# Patient Record
Sex: Male | Born: 1955 | Race: Black or African American | Hispanic: No | Marital: Single | Smoking: Current every day smoker
Health system: Southern US, Community
[De-identification: ages and names within clinical notes are randomized; demographics above are authoritative.]

## PROBLEM LIST (undated history)

## (undated) DIAGNOSIS — R7303 Prediabetes: Secondary | ICD-10-CM

## (undated) DIAGNOSIS — R0989 Other specified symptoms and signs involving the circulatory and respiratory systems: Secondary | ICD-10-CM

## (undated) DIAGNOSIS — M1990 Unspecified osteoarthritis, unspecified site: Secondary | ICD-10-CM

## (undated) DIAGNOSIS — E291 Testicular hypofunction: Secondary | ICD-10-CM

## (undated) HISTORY — DX: Other specified symptoms and signs involving the circulatory and respiratory systems: R09.89

## (undated) HISTORY — DX: Prediabetes: R73.03

## (undated) HISTORY — PX: HERNIA REPAIR: SHX51

## (undated) HISTORY — DX: Testicular hypofunction: E29.1

---

## 2004-02-03 ENCOUNTER — Ambulatory Visit (HOSPITAL_COMMUNITY): Admission: RE | Admit: 2004-02-03 | Discharge: 2004-02-03 | Payer: Self-pay | Admitting: General Surgery

## 2004-02-03 ENCOUNTER — Ambulatory Visit (HOSPITAL_BASED_OUTPATIENT_CLINIC_OR_DEPARTMENT_OTHER): Admission: RE | Admit: 2004-02-03 | Discharge: 2004-02-03 | Payer: Self-pay | Admitting: General Surgery

## 2004-07-19 ENCOUNTER — Emergency Department (HOSPITAL_COMMUNITY): Admission: EM | Admit: 2004-07-19 | Discharge: 2004-07-19 | Payer: Self-pay | Admitting: Emergency Medicine

## 2007-09-27 ENCOUNTER — Emergency Department (HOSPITAL_COMMUNITY): Admission: EM | Admit: 2007-09-27 | Discharge: 2007-09-27 | Payer: Self-pay | Admitting: Emergency Medicine

## 2009-02-13 ENCOUNTER — Emergency Department (HOSPITAL_COMMUNITY): Admission: EM | Admit: 2009-02-13 | Discharge: 2009-02-13 | Payer: Self-pay | Admitting: Emergency Medicine

## 2009-02-27 ENCOUNTER — Encounter: Payer: Self-pay | Admitting: Gastroenterology

## 2009-03-06 ENCOUNTER — Encounter: Payer: Self-pay | Admitting: Gastroenterology

## 2009-03-06 ENCOUNTER — Ambulatory Visit (HOSPITAL_COMMUNITY): Admission: RE | Admit: 2009-03-06 | Discharge: 2009-03-06 | Payer: Self-pay | Admitting: Internal Medicine

## 2009-03-06 ENCOUNTER — Encounter: Payer: Self-pay | Admitting: Internal Medicine

## 2009-03-13 ENCOUNTER — Encounter: Admission: RE | Admit: 2009-03-13 | Discharge: 2009-03-13 | Payer: Self-pay | Admitting: Internal Medicine

## 2009-03-13 ENCOUNTER — Encounter: Payer: Self-pay | Admitting: Internal Medicine

## 2009-04-03 ENCOUNTER — Ambulatory Visit: Payer: Self-pay | Admitting: Internal Medicine

## 2009-04-10 ENCOUNTER — Ambulatory Visit: Payer: Self-pay | Admitting: Gastroenterology

## 2009-04-10 DIAGNOSIS — D638 Anemia in other chronic diseases classified elsewhere: Secondary | ICD-10-CM | POA: Insufficient documentation

## 2009-04-10 DIAGNOSIS — D573 Sickle-cell trait: Secondary | ICD-10-CM | POA: Insufficient documentation

## 2009-07-10 ENCOUNTER — Ambulatory Visit: Payer: Self-pay | Admitting: Internal Medicine

## 2009-07-10 DIAGNOSIS — F17200 Nicotine dependence, unspecified, uncomplicated: Secondary | ICD-10-CM | POA: Insufficient documentation

## 2009-07-23 ENCOUNTER — Emergency Department (HOSPITAL_COMMUNITY): Admission: EM | Admit: 2009-07-23 | Discharge: 2009-07-23 | Payer: Self-pay | Admitting: Emergency Medicine

## 2010-04-30 ENCOUNTER — Encounter: Payer: Self-pay | Admitting: Gastroenterology

## 2010-05-01 ENCOUNTER — Encounter: Payer: Self-pay | Admitting: Gastroenterology

## 2010-05-01 ENCOUNTER — Ambulatory Visit (HOSPITAL_COMMUNITY): Admission: RE | Admit: 2010-05-01 | Discharge: 2010-05-01 | Payer: Self-pay | Admitting: Internal Medicine

## 2010-05-07 ENCOUNTER — Encounter (INDEPENDENT_AMBULATORY_CARE_PROVIDER_SITE_OTHER): Payer: Self-pay | Admitting: *Deleted

## 2010-05-31 ENCOUNTER — Encounter (INDEPENDENT_AMBULATORY_CARE_PROVIDER_SITE_OTHER): Payer: Self-pay | Admitting: *Deleted

## 2010-06-04 ENCOUNTER — Ambulatory Visit: Payer: Self-pay | Admitting: Gastroenterology

## 2010-06-18 ENCOUNTER — Ambulatory Visit: Payer: Self-pay | Admitting: Gastroenterology

## 2010-06-18 HISTORY — PX: COLONOSCOPY: SHX174

## 2010-06-20 ENCOUNTER — Encounter: Payer: Self-pay | Admitting: Gastroenterology

## 2010-12-20 NOTE — Letter (Signed)
Summary: [facility]  [facility]   Imported By: Sherian Rein 06/21/2010 09:03:55  _____________________________________________________________________  External Attachment:    Type:   Image     Comment:   External Document

## 2010-12-20 NOTE — Letter (Signed)
Summary: Patient Notice- Polyp Results  [facility]  [address] [place], [place] [address]   Phone: [phone]  Fax: [phone]        June 20, 2010 MRN: [id number]    Tony Ware [address] [place], [place]  [address]    Dear [name],  I am pleased to inform you that the colon polyp(s) removed during your recent colonoscopy was (were) found to be benign (no cancer detected) upon pathologic examination.  I recommend you have a repeat colonoscopy examination in 5_ years to look for recurrent polyps, as having colon polyps increases your risk for having recurrent polyps or even colon cancer in the future.  Should you develop new or worsening symptoms of abdominal pain, bowel habit changes or bleeding from the rectum or bowels, please schedule an evaluation with either your primary care physician or with me.  Additional information/recommendations:  __ No further action with gastroenterology is needed at this time. Please      follow-up with your primary care physician for your other healthcare      needs.  __ Please call [phone] to schedule a return visit to review your      situation.  __ Please keep your follow-up visit as already scheduled.  _x_ Continue treatment plan as outlined the [place] of your exam.  Please call us if you are having persistent problems or have questions about your condition that have not been fully answered at this time.  Sincerely,  Louis Meckel MD  This letter has been electronically signed by your physician.  Appended Document: Patient Notice- Polyp Results Letter mailed 8.3.2011

## 2010-12-20 NOTE — Letter (Signed)
Summary: [facility] Instructions  [facility]  [address] [place], [place] [address]   Phone: [phone]  Fax: [phone]       Tony Ware    2056-06-19    MRN: [id number]        Procedure [place] /Date:  06/18/10   Monday     Arrival Time:  9:30am      Procedure Time:  10:30am     Location of Procedure:                    _x _  [facility] (4th Floor)   PREPARATION FOR COLONOSCOPY WITH MOVIPREP   Starting 5 days prior to your procedure _ 7/27/11_ do not eat nuts, seeds, popcorn, corn, beans, peas,  salads, or any raw vegetables.  Do not take any fiber supplements (e.g. Metamucil, Citrucel, and Benefiber).  THE [place] BEFORE YOUR PROCEDURE         DATE:   06/17/10  [place]:  Sunday  1.  Drink clear liquids the entire [place]-NO SOLID FOOD  2.  Do not drink anything colored red or purple.  Avoid juices with pulp.  No orange juice.  3.  Drink at least 64 oz. (8 glasses) of fluid/clear liquids during the [place] to prevent dehydration and help the prep work efficiently.  CLEAR LIQUIDS INCLUDE: Water Jello Ice Popsicles Tea (sugar ok, no milk/cream) Powdered fruit flavored drinks Coffee (sugar ok, no milk/cream) Gatorade Juice: apple, white grape, white cranberry  Lemonade Clear bullion, consomm, broth Carbonated beverages (any kind) Strained chicken noodle soup Hard Candy                             4.  In the morning, mix first dose of MoviPrep solution:    Empty 1 Pouch A and 1 Pouch B into the disposable container    Add lukewarm drinking water to the top line of the container. Mix to dissolve    Refrigerate (mixed solution should be used within 24 hrs)  5.  Begin drinking the prep at 5:00 p.m. The MoviPrep container is divided by 4 marks.   Every 15 minutes drink the solution down to the next mark (approximately 8 oz) until the full liter is complete.   6.  Follow completed prep with 16 oz of clear liquid of your choice (Nothing red or purple).   Continue to drink clear liquids until bedtime.  7.  Before going to bed, mix second dose of MoviPrep solution:    Empty 1 Pouch A and 1 Pouch B into the disposable container    Add lukewarm drinking water to the top line of the container. Mix to dissolve    Refrigerate  THE [place] OF YOUR PROCEDURE      DATE:  06/18/10  [place]:  Monday  Beginning at 5:30 a.m. (5 hours before procedure):         1. Every 15 minutes, drink the solution down to the next mark (approx 8 oz) until the full liter is complete.  2. Follow completed prep with 16 oz. of clear liquid of your choice.    3. You may drink clear liquids until _  _ (2 HOURS BEFORE PROCEDURE).   MEDICATION INSTRUCTIONS  Unless otherwise instructed, you should take regular prescription medications with a small sip of water   as early as possible the morning of your procedure.  OTHER INSTRUCTIONS  You will need a responsible adult at least 55 years of age to accompany you and drive you home.   This person must remain in the waiting room during your procedure.  Wear loose fitting clothing that is easily removed.  Leave jewelry and other valuables at home.  However, you may wish to bring a book to read or  an iPod/MP3 player to listen to music as you wait for your procedure to start.  Remove all body piercing jewelry and leave at home.  Total time from sign-in until discharge is approximately 2-3 hours.  You should go home directly after your procedure and rest.  You can resume normal activities the  [place] after your procedure.  The [place] of your procedure you should not:   Drive   Make legal decisions   Operate machinery   Drink alcohol   Return to work  You will receive specific instructions about eating, activities and medications before you leave.    The above instructions have been reviewed and explained to me by   Karl Bales RN  June 04, 2010 1:04 PM    I fully understand and can verbalize  these instructions _____________________________ Date _________

## 2010-12-20 NOTE — Procedures (Signed)
Summary: Colonoscopy  Patient: Tony Ware Note: All result statuses are Final unless otherwise noted.  Tests: (1) Colonoscopy (COL)   COL Colonoscopy           DONE      [facility]     [address]     [place], [place]  [address]           COLONOSCOPY PROCEDURE REPORT           PATIENT:  [name], [name]  MR#:  [id number]     BIRTHDATE:  1956-07-23, 53 yrs. old  GENDER:  male           ENDOSCOPIST:  Barbette Hair. Arlyce Dice, MD     Referred by:           PROCEDURE DATE:  06/18/2010     PROCEDURE:  Colonoscopy with snare polypectomy     ASA CLASS:  Class II     INDICATIONS:  1) Routine Risk Screening  2) Anemia Heme negative     stool; equivical Fe studies           MEDICATIONS:   Fentanyl 75 mcg IV, Versed 8 mg IV           DESCRIPTION OF PROCEDURE:   After the risks benefits and     alternatives of the procedure were thoroughly explained, informed     consent was obtained.  Digital rectal exam was performed and     revealed no abnormalities.   The LB CF-[id number] endoscope     was introduced through the anus and advanced to the cecum, which     was identified by both the appendix and ileocecal valve, without     limitations.  The quality of the prep was excellent, using     MoviPrep.  The instrument was then slowly withdrawn as the colon     was fully examined.     <<PROCEDUREIMAGES>>           FINDINGS:  There were multiple polyps identified and removed. in     the transverse colon. 3 sessile polyps proximal and midtransverse     colon measuring 3,5 and 3mm, respectively. Largest polyp was     removed with hot polypectomy snare; other polyps were removed with     cold polypectomy snare. Specimens were submitted to pathology.     Polyps were not bleeding (see image5 and image6).  This was     otherwise a normal examination of the colon (see image1, image2,     image4, image8, image10, image11, and image12).   Retroflexed     views in the rectum revealed no abnormalities.     The time to     cecum =  2.0  minutes. The scope was then withdrawn (time =  8.25     min) from the patient and the procedure completed.           COMPLICATIONS:  None           ENDOSCOPIC IMPRESSION:     1) Nonbleeding Polyps, multiple in the transverse colon     2) Otherwise normal examination     RECOMMENDATIONS:     1) If the polyp(s) removed today are proven to be adenomatous     (pre-cancerous) polyps, you will need a repeat colonoscopy in 5     years. Otherwise you should continue to follow colorectal cancer     screening guidelines for "routine risk" patients with colonoscopy  in 10 years.     2) hemeoccults in 1 week.           REPEAT EXAM:   You will receive a letter from Dr. Arlyce Dice in 1-2     weeks, after reviewing the final pathology, with followup     recommendations.           ______________________________     Barbette Hair Arlyce Dice, MD           CC: Lucky Cowboy, MD           n.     Rosalie Doctor:   Barbette Hair. Janmichael Giraud at 06/18/2010 11:18 AM           Schauf, Gabriel Rung [phone]  Note: An exclamation mark (!) indicates a result that was not dispersed into the flowsheet. Document Creation Date: 06/18/2010 11:18 AM _______________________________________________________________________  (1) Order result status: Final Collection or observation date-time: 06/18/2010 11:06 Requested date-time:  Receipt date-time:  Reported date-time:  Referring Physician:   Ordering Physician: Melvia Heaps [phone]) Specimen Source:  Source: Launa Grill Order Number: [id number] Lab site:   Appended Document: Colonoscopy     Procedures Next Due Date:    Colonoscopy: 06/2015

## 2010-12-20 NOTE — Letter (Signed)
Summary: Previsit letter  [facility]  [address] [place], [place] [address]   Phone: [phone]  Fax: [phone]       05/07/2010 MRN: [id number]  [facility] [address] [place], [place]  [address]  Dear Mr. SZABO,  Welcome to the Gastroenterology Division at [facility].    You are scheduled to see a nurse for your pre-procedure visit on 06-04-10 at 1pm on the 3rd floor at [facility], [address]  We ask that you try to arrive at our office 15 minutes prior to your appointment time to allow for check-in.  Your nurse visit will consist of discussing your medical and surgical history, your immediate family medical history, and your medications.    Please bring a complete list of all your medications or, if you prefer, bring the medication bottles and we will list them.  We will need to be aware of both prescribed and over the counter drugs.  We will need to know exact dosage information as well.  If you are on blood thinners (Coumadin, Plavix, Aggrenox, Ticlid, etc.) please call our office today/prior to your appointment, as we need to consult with your physician about holding your medication.   Please be prepared to read and sign documents such as consent forms, a financial agreement, and acknowledgement forms.  If necessary, and with your consent, a friend or relative is welcome to sit-in on the nurse visit with you.  Please bring your insurance card so that we may make a copy of it.  If your insurance requires a referral to see a specialist, please bring your referral form from your primary care physician.  No co-pay is required for this nurse visit.     If you cannot keep your appointment, please call [phone] to cancel or reschedule prior to your appointment date.  This allows [place] the opportunity to schedule an appointment for another patient in need of care.    Thank you for choosing [facility] for your medical needs.  We appreciate  the opportunity to care for you.  Please visit [place] at our website  to learn more about our practice.                     Sincerely.                                                                                                                   The Gastroenterology Division

## 2010-12-20 NOTE — Miscellaneous (Signed)
Summary: [facility] previsit  Clinical Lists Changes  Medications: Added new medication of MOVIPREP 100 GM  SOLR (PEG-KCL-NACL-NASULF-NA ASC-C) As per prep instructions. - Signed Rx of MOVIPREP 100 GM  SOLR (PEG-KCL-NACL-NASULF-NA ASC-C) As per prep instructions.;  #1 x 0;  Signed;  Entered by: Karl Bales RN;  Authorized by: Louis Meckel MD;  Method used: Electronically to [facility] [phone]*, [address] [place], [place], [place]  [address], Ph: [phone] or [phone], Fax: [phone] Observations: Added new observation of NKA: T (06/04/2010 12:43)    Prescriptions: MOVIPREP 100 GM  SOLR (PEG-KCL-NACL-NASULF-NA ASC-C) As per prep instructions.  #1 x 0   Entered by:   Karl Bales RN   Authorized by:   Louis Meckel MD   Signed by:   Karl Bales RN on 06/04/2010   Method used:   Electronically to        [facility] [phone]* (retail)       [address] [place], [place]  [address]       Ph: [phone] or [phone]       Fax: [phone]   RxID:   [id number]

## 2011-04-05 NOTE — Op Note (Signed)
NAME:  Tony Ware, Tony Ware                               ACCOUNT NO.:  [id number]   MEDICAL RECORD NO.:  [id number]                   PATIENT TYPE:  AMB   LOCATION:  DSC                                  FACILITY:  [facility]   PHYSICIAN:  Jimmye Norman III, M.D.               DATE OF BIRTH:  October 30, 1956   DATE OF PROCEDURE:  02/03/2004  DATE OF DISCHARGE:                                 OPERATIVE REPORT   PREOPERATIVE DIAGNOSIS:  Right inguinal hernia.   POSTOPERATIVE DIAGNOSIS:  Indirect right inguinal hernia.   PROCEDURE:  Right inguinal hernia repair.   SURGEON:  Jimmye Norman, M.D.   ASSISTANT:  None.   ANESTHESIA:  General with a laryngeal airway.   ESTIMATED BLOOD LOSS:  Less than 10 mL.   COMPLICATIONS:  None.   DISPOSITION:  Stable.   INDICATIONS FOR PROCEDURE:  The patient is a 55 year old with a right  inguinal hernia who comes in for repair.   FINDINGS:  The patient had an indirect sac with a small weakness in the  floor.  Anatomy was otherwise normal.   DESCRIPTION OF PROCEDURE:  The patient was taken to the operating room and  placed on the table in the supine position.  After an adequate laryngeal  airway anesthetic was administered, he was prepped and draped in the usual  sterile fashion exposing the right lower quadrant.   Curvilinear incision was made at the inferior skin fold at the left groin  area at the level of the superficial ring.  It was taken down to and through  Scarpa's fascia using electrocautery.  We dissected down to the external  oblique fascia which was then split along its fibers down through the  superficial ring.   We mobilized the spermatic cord and used the Penrose drain to control it as  we dissected out the floor.  There was some weakness in the floor, but no  overt direct hernia.   We placed the spermatic cord up on a work bench and dissected out a hernia  sac which was anteromedially.  There was also some lipomatous accumulation  in the cord,  but no overt lipoma.   The hernia sac itself was dissected free and was subsequently suture ligated  at its base using a low Ethibond suture.  Once this was done, it retracted  back into the subfascial plane after transecting it.  We then placed an oval  piece of mesh measuring 4 x 2 cm in size starting at the pubic tubercle and  out to the deep ring.  We attached it to the conjoin tendon and a reflected  portion of the inguinal ligament using a running 0 Prolene suture.  Once  this was sutured in place, we placed the cord back into the canal and then  oversewed that area with the external oblique fascia using a running 3-0  Vicryl  suture.  0.5% Marcaine without epinephrine was then injected into the  wound, a total of 20 mL were used.  The Scarpa's fascia was then  reapproximated using interrupted 3-0 Vicryl sutures and the skin was closed  using a running subcuticular stitch of 4-0 Prolene.  All needle, sponge, and  instrument counts correct. A sterile dressing was applied.                                               Kathrin Ruddy, M.D.    JW/MEDQ  D:  02/03/2004  T:  02/06/2004  Job:  [id number]

## 2011-08-09 ENCOUNTER — Ambulatory Visit (HOSPITAL_COMMUNITY)
Admission: RE | Admit: 2011-08-09 | Discharge: 2011-08-09 | Disposition: A | Discharge status: Home / Self Care | Payer: Managed Care, Other (non HMO) | Source: Ambulatory Visit | Attending: Internal Medicine | Admitting: Internal Medicine

## 2011-08-09 ENCOUNTER — Other Ambulatory Visit (HOSPITAL_COMMUNITY): Payer: Self-pay | Admitting: Internal Medicine

## 2011-08-09 DIAGNOSIS — I10 Essential (primary) hypertension: Secondary | ICD-10-CM

## 2011-08-09 DIAGNOSIS — R091 Pleurisy: Secondary | ICD-10-CM | POA: Insufficient documentation

## 2011-08-09 DIAGNOSIS — Z0000 Encounter for general adult medical examination without abnormal findings: Secondary | ICD-10-CM | POA: Insufficient documentation

## 2011-11-01 ENCOUNTER — Emergency Department (HOSPITAL_COMMUNITY)
Admission: EM | Admit: 2011-11-01 | Discharge: 2011-11-01 | Disposition: A | Discharge status: Home / Self Care | Payer: Managed Care, Other (non HMO) | Attending: Emergency Medicine | Admitting: Emergency Medicine

## 2011-11-01 DIAGNOSIS — Z79899 Other long term (current) drug therapy: Secondary | ICD-10-CM | POA: Insufficient documentation

## 2011-11-01 DIAGNOSIS — M1990 Unspecified osteoarthritis, unspecified site: Secondary | ICD-10-CM | POA: Insufficient documentation

## 2011-11-01 DIAGNOSIS — M25519 Pain in unspecified shoulder: Secondary | ICD-10-CM | POA: Insufficient documentation

## 2011-11-01 DIAGNOSIS — M25511 Pain in right shoulder: Secondary | ICD-10-CM

## 2011-11-01 HISTORY — DX: Unspecified osteoarthritis, unspecified site: M19.90

## 2011-11-01 MED ORDER — METHOCARBAMOL 500 MG PO TABS: ORAL_TABLET | ORAL | Status: DC

## 2011-11-01 MED ORDER — KETOROLAC TROMETHAMINE 30 MG/ML IJ SOLN
30.0000 mg | Freq: Once | INTRAMUSCULAR | Status: AC
  Administered 2011-11-01: 30 mg via INTRAVENOUS
  Filled 2011-11-01: qty 1

## 2011-11-01 MED ORDER — ACETAMINOPHEN-CODEINE #3 300-30 MG PO TABS: 1.0000 | ORAL_TABLET | Freq: Four times a day (QID) | ORAL | Status: AC | PRN

## 2011-11-01 NOTE — ED Provider Notes (Signed)
Medical screening examination/treatment/procedure(s) were performed by non-physician practitioner and as supervising physician I was immediately available for consultation/collaboration. Devoria Albe, MD, Armando Gang   Ward Givens, MD 11/01/11 [phone]

## 2011-11-01 NOTE — ED Notes (Signed)
C/o right shoulder pain x 1 week, 9/10, denied any injury, reports pain from osteoarthritis.

## 2011-11-01 NOTE — ED Provider Notes (Signed)
History     CSN: [id number] Arrival date & time: 11/01/2011  3:35 PM   First MD Initiated Contact with Patient 11/01/11 1717      No chief complaint on file.   (Consider location/radiation/quality/duration/timing/severity/associated sxs/prior treatment) HPI  Patient presents to emergency department complaining of a one-week history of gradual onset of right-sided shoulder pain. Patient denies known injury. Patient states he has had similar pain in his left shoulder in the past that has been attributed to osteoarthritis by himself and previous healthcare providers. 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 Patient states the pain hurts when he has to turn his body to look over his shoulders to back his truck. Patient states pain is aggravated by other movement and relieved with rest. Patient states he's taking Aleve at home with some temporary relief of pain. Patient denies extremity numbness, tingling, or weakness. Patient denies chest pain, shortness of breath, weight loss. He denies any skin changes, redness or swelling of his extremity.  Past Medical History  Diagnosis Date  . Osteoarthritis     Past Surgical History  Procedure Date  . Hernia repair     No family history on file.  History  Substance Use Topics  . Smoking status: Former Games developer  . Smokeless tobacco: Not on file  . Alcohol Use: Yes      Review of Systems  All other systems reviewed and are negative.    Allergies  Review of patient's allergies indicates no known allergies.  Home Medications   Current Outpatient Rx  Name Route Sig Dispense Refill  . ACYCLOVIR 400 MG PO TABS Oral Take 400 mg by mouth 2 (two) times daily as needed. For fever blister    . FINASTERIDE 5 MG PO TABS Oral Take 5 mg by mouth daily.      Marland Kitchen NAPROXEN SODIUM 220 MG PO TABS Oral Take 440 mg by mouth 2 (two) times daily as needed. For pain.     Marland Kitchen TAMSULOSIN HCL 0.4 MG PO CAPS Oral Take 0.4 mg by mouth every evening.         BP 121/79  Pulse 65  Temp(Src) 98.2 F (36.8 C) (Oral)  Resp 16  SpO2 100%  Physical Exam  Nursing note and vitals reviewed. Constitutional: He is oriented to person, place, and time. He appears well-developed and well-nourished. No distress.  HENT:  Head: Normocephalic and atraumatic.  Eyes: Conjunctivae are normal.  Neck: Normal range of motion. Neck supple.  Cardiovascular: Normal rate, regular rhythm, normal heart sounds and intact distal pulses.  Exam reveals no gallop and no friction rub.   No murmur heard. Pulmonary/Chest: Effort normal and breath sounds normal. No respiratory distress. He has no wheezes. He has no rales. He exhibits no tenderness.  Abdominal: Bowel sounds are normal. He exhibits no distension. There is no tenderness.  Musculoskeletal: Normal range of motion. He exhibits tenderness. He exhibits no edema.       Full range of motion of right shoulder with some pain with extreme external rotation and flexion. No swelling of shoulder, erythema or break in skin.   Neurological: He is alert and oriented to person, place, and time. He has normal reflexes.  Skin: Skin is warm and dry. No rash noted. He is not diaphoretic. No erythema.  Psychiatric: He has a normal mood and affect.    ED Course  Procedures (including critical care time)  IM toradol  Labs Reviewed - No data to display No results  found.   1. Right shoulder pain       MDM  The signs or symptoms of septic joint. Right upper extremity is neurovascularly intact without known injury. Tenderness with range of motion likely rotator cuff tendinitis. Will treat with anti-inflammatories and breakthrough pain meds. Patient agreeable to following up with orthopedic specialist for further evaluation of ongoing pain.        Jenness Corner, [place] 11/01/11 [phone]

## 2011-11-01 NOTE — ED Notes (Signed)
D/c'd home with 2 rx

## 2013-08-19 ENCOUNTER — Other Ambulatory Visit (HOSPITAL_COMMUNITY): Payer: Self-pay | Admitting: Internal Medicine

## 2013-08-19 ENCOUNTER — Ambulatory Visit (HOSPITAL_COMMUNITY)
Admission: RE | Admit: 2013-08-19 | Discharge: 2013-08-19 | Disposition: A | Discharge status: Home / Self Care | Payer: Managed Care, Other (non HMO) | Source: Ambulatory Visit | Attending: Internal Medicine | Admitting: Internal Medicine

## 2013-08-19 DIAGNOSIS — R091 Pleurisy: Secondary | ICD-10-CM | POA: Insufficient documentation

## 2013-08-19 DIAGNOSIS — I10 Essential (primary) hypertension: Secondary | ICD-10-CM

## 2013-08-19 DIAGNOSIS — F17200 Nicotine dependence, unspecified, uncomplicated: Secondary | ICD-10-CM | POA: Insufficient documentation

## 2014-02-05 DIAGNOSIS — R0989 Other specified symptoms and signs involving the circulatory and respiratory systems: Secondary | ICD-10-CM | POA: Insufficient documentation

## 2014-02-05 DIAGNOSIS — M1990 Unspecified osteoarthritis, unspecified site: Secondary | ICD-10-CM | POA: Insufficient documentation

## 2014-02-05 DIAGNOSIS — E349 Endocrine disorder, unspecified: Secondary | ICD-10-CM | POA: Insufficient documentation

## 2014-02-05 DIAGNOSIS — R7309 Other abnormal glucose: Secondary | ICD-10-CM | POA: Insufficient documentation

## 2014-02-10 ENCOUNTER — Ambulatory Visit: Payer: Self-pay | Admitting: Internal Medicine

## 2014-02-16 ENCOUNTER — Other Ambulatory Visit: Payer: Self-pay | Admitting: Internal Medicine

## 2014-03-10 ENCOUNTER — Encounter: Payer: Self-pay | Admitting: Internal Medicine

## 2014-03-10 ENCOUNTER — Ambulatory Visit (INDEPENDENT_AMBULATORY_CARE_PROVIDER_SITE_OTHER): Payer: Managed Care, Other (non HMO) | Admitting: Internal Medicine

## 2014-03-10 VITALS — BP 90/64 | HR 80 | Temp 97.7°F | Resp 16 | Ht 70.5 in | Wt 174.4 lb

## 2014-03-10 DIAGNOSIS — R7303 Prediabetes: Secondary | ICD-10-CM

## 2014-03-10 DIAGNOSIS — Z79899 Other long term (current) drug therapy: Secondary | ICD-10-CM | POA: Insufficient documentation

## 2014-03-10 DIAGNOSIS — E559 Vitamin D deficiency, unspecified: Secondary | ICD-10-CM

## 2014-03-10 DIAGNOSIS — R0989 Other specified symptoms and signs involving the circulatory and respiratory systems: Secondary | ICD-10-CM

## 2014-03-10 DIAGNOSIS — E782 Mixed hyperlipidemia: Secondary | ICD-10-CM | POA: Insufficient documentation

## 2014-03-10 DIAGNOSIS — I10 Essential (primary) hypertension: Secondary | ICD-10-CM

## 2014-03-10 DIAGNOSIS — E291 Testicular hypofunction: Secondary | ICD-10-CM

## 2014-03-10 DIAGNOSIS — R7309 Other abnormal glucose: Secondary | ICD-10-CM

## 2014-03-10 LAB — CBC WITH DIFFERENTIAL/PLATELET
BASOS PCT: 2 % — AB (ref 0–1)
Basophils Absolute: 0.1 10*3/uL (ref 0.0–0.1)
EOS ABS: 0.4 10*3/uL (ref 0.0–0.7)
Eosinophils Relative: 7 % — ABNORMAL HIGH (ref 0–5)
HCT: 35.1 % — ABNORMAL LOW (ref 39.0–52.0)
Hemoglobin: 12.4 g/dL — ABNORMAL LOW (ref 13.0–17.0)
Lymphocytes Relative: 44 % (ref 12–46)
Lymphs Abs: 2.4 10*3/uL (ref 0.7–4.0)
MCH: 30.8 pg (ref 26.0–34.0)
MCHC: 35.3 g/dL (ref 30.0–36.0)
MCV: 87.3 fL (ref 78.0–100.0)
MONOS PCT: 9 % (ref 3–12)
Monocytes Absolute: 0.5 10*3/uL (ref 0.1–1.0)
NEUTROS PCT: 38 % — AB (ref 43–77)
Neutro Abs: 2.1 10*3/uL (ref 1.7–7.7)
PLATELETS: 356 10*3/uL (ref 150–400)
RBC: 4.02 MIL/uL — ABNORMAL LOW (ref 4.22–5.81)
RDW: 15.1 % (ref 11.5–15.5)
WBC: 5.5 10*3/uL (ref 4.0–10.5)

## 2014-03-10 LAB — LIPID PANEL
Cholesterol: 177 mg/dL (ref 0–200)
HDL: 53 mg/dL (ref >39)
LDL CALC: 107 mg/dL — AB (ref 0–99)
TRIGLYCERIDES: 86 mg/dL (ref <150)
Total CHOL/HDL Ratio: 3.3 Ratio
VLDL: 17 mg/dL (ref 0–40)

## 2014-03-10 LAB — BASIC METABOLIC PANEL WITH GFR
BUN: 9 mg/dL (ref 6–23)
CHLORIDE: 102 meq/L (ref 96–112)
CO2: 25 meq/L (ref 19–32)
Calcium: 9.1 mg/dL (ref 8.4–10.5)
Creat: 0.97 mg/dL (ref 0.50–1.35)
GFR, Est African American: >89 mL/min
GFR, Est Non African American: 86 mL/min
GLUCOSE: 88 mg/dL (ref 70–99)
Potassium: 4.2 mEq/L (ref 3.5–5.3)
Sodium: 136 mEq/L (ref 135–145)

## 2014-03-10 LAB — MAGNESIUM: MAGNESIUM: 2.2 mg/dL (ref 1.5–2.5)

## 2014-03-10 LAB — HEMOGLOBIN A1C
Hgb A1c MFr Bld: 6.1 % — ABNORMAL HIGH (ref <5.7)
Mean Plasma Glucose: 128 mg/dL — ABNORMAL HIGH (ref <117)

## 2014-03-10 LAB — TSH: TSH: 1.158 u[IU]/mL (ref 0.350–4.500)

## 2014-03-10 NOTE — Progress Notes (Signed)
Patient ID: Tony Ware, male   DOB: Dec 16, 1955, 58 y.o.   MRN: [id number]    This very nice 58 y.o. male presents for 3 month follow up with Hypertension, Hyperlipidemia, Pre-Diabetes and Vitamin D Deficiency.    Patient has been monitored for labile HTN since 1995. BP has been controlled with today's BP: 90/64 mmHg . Patient denies any cardiac type chest pain, palpitations, dyspnea/orthopnea/PND, dizziness, claudication, or dependent edema.   Hyperlipidemia is controlled with diet & meds. Last Cholesterol was 193, Triglycerides were 289, HDL 50 and LDL 85  In Oct 2014 - at goal. Patient denies myalgias or other med SE's.    Also, the patient has history of PreDiabetes with A1c 6.0% in Sept 2012 with last A1c of 6.0% in Oct 2014. Patient denies any symptoms of reactive hypoglycemia, diabetic polys, paresthesias or visual blurring.   Further, Patient has history of Vitamin D Deficiency with last vitamin D of 73 in Oct 2014. Patient supplements vitamin D without any suspected side-effects.  Medication Sig  . acyclovir (ZOVIRAX) 400 MG tablet TAKE ONE TABLET BY MOUTH TWICE DAILY  . Cholecalciferol (VITAMIN D PO) Take by mouth daily.  . finasteride (PROSCAR) 5 MG tablet Take 5 mg by mouth daily.    . Tamsulosin HCl (FLOMAX) 0.4 MG CAPS Take 0.4 mg by mouth every evening.      No Known Allergies  PMHx:   Past Medical History  Diagnosis Date  . Labile hypertension   . Osteoarthritis   . Prediabetes   . Other testicular hypofunction    FHx:    Reviewed / unchanged  SHx:    Reviewed / unchanged   Systems Review: Constitutional: Denies fever, chills, wt changes, headaches, insomnia, fatigue, night sweats, change in appetite. Eyes: Denies redness, blurred vision, diplopia, discharge, itchy, watery eyes.  ENT: Denies discharge, congestion, post nasal drip, epistaxis, sore throat, earache, hearing loss, dental pain, tinnitus, vertigo, sinus pain, snoring.  CV: Denies chest pain, palpitations,  irregular heartbeat, syncope, dyspnea, diaphoresis, orthopnea, PND, claudication, edema. Respiratory: denies cough, dyspnea, DOE, pleurisy, hoarseness, laryngitis, wheezing.  Gastrointestinal: Denies dysphagia, odynophagia, heartburn, reflux, water brash, abdominal pain or cramps, nausea, vomiting, bloating, diarrhea, constipation, hematemesis, melena, hematochezia,  or hemorrhoids. Genitourinary: Denies dysuria, frequency, urgency, nocturia, hesitancy, discharge, hematuria, flank pain. Musculoskeletal: Denies arthralgias, myalgias, stiffness, jt. swelling, pain, limp, strain/sprain.  Skin: Denies pruritus, rash, hives, warts, acne, eczema, change in skin lesion(s). Neuro: No weakness, tremor, incoordination, spasms, paresthesia, or pain. Psychiatric: Denies confusion, memory loss, or sensory loss. Endo: Denies change in weight, skin, hair change.  Heme/Lymph: No excessive bleeding, bruising, orenlarged lymph nodes.  Exam:  BP 90/64  Pulse 80  Temp(Src) 97.7 F (36.5 C) (Temporal)  Resp 16  Ht 5' 10.5" (1.791 m)  Wt 174 lb 6.4 oz (79.107 kg)  BMI 24.66 kg/m2  Appears well nourished - in no distress. Eyes: PERRLA, EOMs, conjunctiva no swelling or erythema. Sinuses: No frontal/maxillary tenderness ENT/Mouth: EAC's clear, TM's nl w/o erythema, bulging. Nares clear w/o erythema, swelling, exudates. Oropharynx clear without erythema or exudates. Oral hygiene is good. Tongue normal, non obstructing. Hearing intact.  Neck: Supple. Thyroid nl. Car 2+/2+ without bruits, nodes or JVD. Chest: Respirations nl with BS clear & equal w/o rales, rhonchi, wheezing or stridor.  Cor: Heart sounds normal w/ regular rate and rhythm without sig. murmurs, gallops, clicks, or rubs. Peripheral pulses normal and equal  without edema.  Abdomen: Soft & bowel sounds normal. Non-tender w/o guarding, rebound, hernias,  masses, or organomegaly.  Lymphatics: Unremarkable.  Musculoskeletal: Full ROM all peripheral  extremities, joint stability, 5/5 strength, and normal gait.  Skin: Warm, dry without exposed rashes, lesions, ecchymosis apparent.  Neuro: Cranial nerves intact, reflexes equal bilaterally. Sensory-motor testing grossly intact. Tendon reflexes grossly intact.  Pysch: Alert & oriented x 3. Insight and judgement nl & appropriate. No ideations.  Assessment and Plan:  1. Hypertension - Continue monitor blood pressure at home. Continue diet/meds same.  2. Hyperlipidemia - Continue diet/meds, exercise,& lifestyle modifications. Continue monitor periodic cholesterol/liver & renal functions   3. Pre-diabetes - Continue diet, exercise, lifestyle modifications. Monitor appropriate labs.  4. Vitamin D Deficiency - Continue supplementation.  5. Testosterone Deficiency - patient defers Tx at present.  Recommended regular exercise, BP monitoring, weight control, and discussed med and SE's. Recommended labs to assess and monitor clinical status. Further disposition pending results of labs.

## 2014-03-10 NOTE — Patient Instructions (Signed)
 Hypertension As your heart beats, it forces blood through your arteries. This force is your blood pressure. If the pressure is too high, it is called hypertension (HTN) or high blood pressure. HTN is dangerous because you may have it and not know it. High blood pressure may mean that your heart has to work harder to pump blood. Your arteries may be narrow or stiff. The extra work puts you at risk for heart disease, stroke, and other problems.  Blood pressure consists of two numbers, a higher number over a lower, 110/72, for example. It is stated as "110 over 72." The ideal is below 120 for the top number (systolic) and under 80 for the bottom (diastolic). Write down your blood pressure today. You should pay close attention to your blood pressure if you have certain conditions such as:  Heart failure.  Prior heart attack.  Diabetes  Chronic kidney disease.  Prior stroke.  Multiple risk factors for heart disease. To see if you have HTN, your blood pressure should be measured while you are seated with your arm held at the level of the heart. It should be measured at least twice. A one-time elevated blood pressure reading (especially in the Emergency Department) does not mean that you need treatment. There may be conditions in which the blood pressure is different between your right and left arms. It is important to see your caregiver soon for a recheck. Most people have essential hypertension which means that there is not a specific cause. This type of high blood pressure may be lowered by changing lifestyle factors such as:  Stress.  Smoking.  Lack of exercise.  Excessive weight.  Drug/tobacco/alcohol use.  Eating less salt. Most people do not have symptoms from high blood pressure until it has caused damage to the body. Effective treatment can often prevent, delay or reduce that damage. TREATMENT  When a cause has been identified, treatment for high blood pressure is directed at  the cause. There are a large number of medications to treat HTN. These fall into several categories, and your caregiver will help you select the medicines that are best for you. Medications may have side effects. You should review side effects with your caregiver. If your blood pressure stays high after you have made lifestyle changes or started on medicines,   Your medication(s) may need to be changed.  Other problems may need to be addressed.  Be certain you understand your prescriptions, and know how and when to take your medicine.  Be sure to follow up with your caregiver within the time frame advised (usually within two weeks) to have your blood pressure rechecked and to review your medications.  If you are taking more than one medicine to lower your blood pressure, make sure you know how and at what times they should be taken. Taking two medicines at the same time can result in blood pressure that is too low. SEEK IMMEDIATE MEDICAL CARE IF:  You develop a severe headache, blurred or changing vision, or confusion.  You have unusual weakness or numbness, or a faint feeling.  You have severe chest or abdominal pain, vomiting, or breathing problems. MAKE SURE YOU:   Understand these instructions.  Will watch your condition.  Will get help right away if you are not doing well or get worse.   Diabetes and Exercise Exercising regularly is important. It is not just about losing weight. It has many health benefits, such as:  Improving your overall fitness, flexibility, and   endurance.  Increasing your bone density.  Helping with weight control.  Decreasing your body fat.  Increasing your muscle strength.  Reducing stress and tension.  Improving your overall health. People with diabetes who exercise gain additional benefits because exercise:  Reduces appetite.  Improves the body's use of blood sugar (glucose).  Helps lower or control blood glucose.  Decreases blood  pressure.  Helps control blood lipids (such as cholesterol and triglycerides).  Improves the body's use of the hormone insulin by:  Increasing the body's insulin sensitivity.  Reducing the body's insulin needs.  Decreases the risk for heart disease because exercising:  Lowers cholesterol and triglycerides levels.  Increases the levels of good cholesterol (such as high-density lipoproteins [HDL]) in the body.  Lowers blood glucose levels. YOUR ACTIVITY PLAN  Choose an activity that you enjoy and set realistic goals. Your health care provider or diabetes educator can help you make an activity plan that works for you. You can break activities into 2 or 3 sessions throughout the [place]. Doing so is as good as one long session. Exercise ideas include:  Taking the dog for a walk.  Taking the stairs instead of the elevator.  Dancing to your favorite song.  Doing your favorite exercise with a friend. RECOMMENDATIONS FOR EXERCISING WITH TYPE 1 OR TYPE 2 DIABETES   Check your blood glucose before exercising. If blood glucose levels are greater than 240 mg/dL, check for urine ketones. Do not exercise if ketones are present.  Avoid injecting insulin into areas of the body that are going to be exercised. For example, avoid injecting insulin into:  The arms when playing tennis.  The legs when jogging.  Keep a record of:  Food intake before and after you exercise.  Expected peak times of insulin action.  Blood glucose levels before and after you exercise.  The type and amount of exercise you have done.  Review your records with your health care provider. Your health care provider will help you to develop guidelines for adjusting food intake and insulin amounts before and after exercising.  If you take insulin or oral hypoglycemic agents, watch for signs and symptoms of hypoglycemia. They include:  Dizziness.  Shaking.  Sweating.  Chills.  Confusion.  Drink plenty of water  while you exercise to prevent dehydration or heat stroke. Body water is lost during exercise and must be replaced.  Talk to your health care provider before starting an exercise program to make sure it is safe for you. Remember, almost any type of activity is better than none.    Cholesterol Cholesterol is a white, waxy, fat-like protein needed by your body in small amounts. The liver makes all the cholesterol you need. It is carried from the liver by the blood through the blood vessels. Deposits (plaque) may build up on blood vessel walls. This makes the arteries narrower and stiffer. Plaque increases the risk for heart attack and stroke. You cannot feel your cholesterol level even if it is very high. The only way to know is by a blood test to check your lipid (fats) levels. Once you know your cholesterol levels, you should keep a record of the test results. Work with your caregiver to to keep your levels in the desired range. WHAT THE RESULTS MEAN:  Total cholesterol is a rough measure of all the cholesterol in your blood.  LDL is the so-called bad cholesterol. This is the type that deposits cholesterol in the walls of the arteries. You want this   level to be low.  HDL is the good cholesterol because it cleans the arteries and carries the LDL away. You want this level to be high.  Triglycerides are fat that the body can either burn for energy or store. High levels are closely linked to heart disease. DESIRED LEVELS:  Total cholesterol below 200.  LDL below 100 for people at risk, below 70 for very high risk.  HDL above 50 is good, above 60 is best.  Triglycerides below 150. HOW TO LOWER YOUR CHOLESTEROL:  Diet.  Choose fish or white meat chicken and turkey, roasted or baked. Limit fatty cuts of red meat, fried foods, and processed meats, such as sausage and lunch meat.  Eat lots of fresh fruits and vegetables. Choose whole grains, beans, pasta, potatoes and cereals.  Use only  small amounts of olive, corn or canola oils. Avoid butter, mayonnaise, shortening or palm kernel oils. Avoid foods with trans-fats.  Use skim/nonfat milk and low-fat/nonfat yogurt and cheeses. Avoid whole milk, cream, ice cream, egg yolks and cheeses. Healthy desserts include angel food cake, ginger snaps, animal crackers, hard candy, popsicles, and low-fat/nonfat frozen yogurt. Avoid pastries, cakes, pies and cookies.  Exercise.  A regular program helps decrease LDL and raises HDL.  Helps with weight control.  Do things that increase your activity level like gardening, walking, or taking the stairs.  Medication.  May be prescribed by your caregiver to help lowering cholesterol and the risk for heart disease.  You may need medicine even if your levels are normal if you have several risk factors. HOME CARE INSTRUCTIONS   Follow your diet and exercise programs as suggested by your caregiver.  Take medications as directed.  Have blood work done when your caregiver feels it is necessary. MAKE SURE YOU:   Understand these instructions.  Will watch your condition.  Will get help right away if you are not doing well or get worse.      Vitamin D Deficiency Vitamin D is an important vitamin that your body needs. Having too little of it in your body is called a deficiency. A very bad deficiency can make your bones soft and can cause a condition called rickets.  Vitamin D is important to your body for different reasons, such as:   It helps your body absorb 2 minerals called calcium and phosphorus.  It helps make your bones healthy.  It may prevent some diseases, such as diabetes and multiple sclerosis.  It helps your muscles and heart. You can get vitamin D in several ways. It is a natural part of some foods. The vitamin is also added to some dairy products and cereals. Some people take vitamin D supplements. Also, your body makes vitamin D when you are in the sun. It changes the  sun's rays into a form of the vitamin that your body can use. CAUSES   Not eating enough foods that contain vitamin D.  Not getting enough sunlight.  Having certain digestive system diseases that make it hard to absorb vitamin D. These diseases include Crohn's disease, chronic pancreatitis, and cystic fibrosis.  Having a surgery in which part of the stomach or small intestine is removed.  Being obese. Fat cells pull vitamin D out of your blood. That means that obese people may not have enough vitamin D left in their blood and in other body tissues.  Having chronic kidney or liver disease. RISK FACTORS Risk factors are things that make you more likely to develop a vitamin   D deficiency. They include:  Being older.  Not being able to get outside very much.  Living in a nursing home.  Having had broken bones.  Having weak or thin bones (osteoporosis).  Having a disease or condition that changes how your body absorbs vitamin D.  Having dark skin.  Some medicines such as seizure medicines or steroids.  Being overweight or obese. SYMPTOMS Mild cases of vitamin D deficiency may not have any symptoms. If you have a very bad case, symptoms may include:  Bone pain.  Muscle pain.  Falling often.  Broken bones caused by a minor injury, due to osteoporosis. DIAGNOSIS A blood test is the best way to tell if you have a vitamin D deficiency. TREATMENT Vitamin D deficiency can be treated in different ways. Treatment for vitamin D deficiency depends on what is causing it. Options include:  Taking vitamin D supplements.  Taking a calcium supplement. Your caregiver will suggest what dose is best for you. HOME CARE INSTRUCTIONS  Take any supplements that your caregiver prescribes. Follow the directions carefully. Take only the suggested amount.  Have your blood tested 2 months after you start taking supplements.  Eat foods that contain vitamin D. Healthy choices  include:  Fortified dairy products, cereals, or juices. Fortified means vitamin D has been added to the food. Check the label on the package to be sure.  Fatty fish like salmon or trout.  Eggs.  Oysters.  Do not use a tanning bed.  Keep your weight at a healthy level. Lose weight if you need to.  Keep all follow-up appointments. Your caregiver will need to perform blood tests to make sure your vitamin D deficiency is going away. SEEK MEDICAL CARE IF:  You have any questions about your treatment.  You continue to have symptoms of vitamin D deficiency.  You have nausea or vomiting.  You are constipated.  You feel confused.  You have severe abdominal or back pain. MAKE SURE YOU:  Understand these instructions.  Will watch your condition.  Will get help right away if you are not doing well or get worse.   

## 2014-03-11 LAB — TESTOSTERONE: TESTOSTERONE: 485 ng/dL (ref 300–890)

## 2014-03-11 LAB — VITAMIN D 25 HYDROXY (VIT D DEFICIENCY, FRACTURES): VIT D 25 HYDROXY: 69 ng/mL (ref 30–89)

## 2014-03-11 LAB — INSULIN, FASTING: INSULIN FASTING, SERUM: 4 u[IU]/mL (ref 3–28)

## 2014-04-12 ENCOUNTER — Other Ambulatory Visit: Payer: Self-pay | Admitting: Internal Medicine

## 2014-04-30 ENCOUNTER — Other Ambulatory Visit: Payer: Self-pay | Admitting: Emergency Medicine

## 2014-06-08 ENCOUNTER — Ambulatory Visit (INDEPENDENT_AMBULATORY_CARE_PROVIDER_SITE_OTHER): Payer: Managed Care, Other (non HMO) | Admitting: Internal Medicine

## 2014-06-08 ENCOUNTER — Encounter: Payer: Self-pay | Admitting: Internal Medicine

## 2014-06-08 VITALS — BP 116/64 | HR 104 | Temp 98.1°F | Resp 16 | Ht 70.5 in | Wt 178.6 lb

## 2014-06-08 DIAGNOSIS — IMO0002 Reserved for concepts with insufficient information to code with codable children: Secondary | ICD-10-CM

## 2014-06-08 DIAGNOSIS — S39013A Strain of muscle, fascia and tendon of pelvis, initial encounter: Secondary | ICD-10-CM

## 2014-06-08 MED ORDER — PREDNISONE 20 MG PO TABS: ORAL_TABLET | ORAL | Status: DC

## 2014-06-08 NOTE — Patient Instructions (Signed)
Inguinal Strain Your exam shows you have an inguinal strain. This is also known as a pulled groin. This injury is usually due to a pull or partial tear to a muscle or tendon in the groin area. Most groin pulls take several weeks to heal completely. There may be pain with lifting your leg or walking during much of your recovery. Treatment for groin strains includes:  Rest and avoid lifting or performing activities that increase your pain.  Apply ice packs for 20-30 minutes every few hours to reduce pain and swelling over the next 2-3 days.  Medicine to reduce pain and inflammation is often prescribed. HOME CARE INSTRUCTIONS  While most strains in the groin area will heal with rest, you should also watch for any signs of a more serious condition.  SEEK IMMEDIATE MEDICAL CARE IF:   You notice unusual swelling or bulging in the groin.  You have pain or swelling in the testicle.  Blood in your urine.  Marked increased pain.  Weakness or numbness of your leg or abdominal pain. MAKE SURE YOU:   Understand these instructions.  Will watch your condition.  Will get help right away if you are not doing well or get worse. Marland Kitchen

## 2014-06-08 NOTE — Progress Notes (Signed)
   Subjective:    Patient ID: Tony Ware, male    DOB: 1956-09-04, 58 y.o.   MRN: [id number]  HPI Presents with c/o of "pulled muscle in Rt groin". Hx/o [facility] 2002 by Dr Hulen Skains. Denies injury or other Sx's. Medication Sig  . acyclovir (ZOVIRAX) 400 MG tablet TAKE ONE TABLET BY MOUTH TWICE DAILY  . buPROPion300 MG 24 hr tablet TAKE 1 TABLET BY MOUTH DAILY  . VITAMIN D  Take 2,000 Units by mouth daily.   . finasteride  5 MG tablet Take 5 mg by mouth daily.    . Tamsulosin HCl  0.4 MG CAPS Take 0.4 mg by mouth every evening.     No Known Allergies  Past Medical History  Diagnosis Date  . Labile hypertension   . Osteoarthritis   . Prediabetes   . Other testicular hypofunction    Past Surgical History  Procedure Laterality Date  . Hernia repair     Review of Systems In addition to the HPI above,  No Fever-chills,  No Headache, No changes with Vision or hearing,  No problems swallowing food or Liquids,  No Chest pain or productive Cough or Shortness of Breath,  No Abdominal pain, No Nausea or Vomitting, Bowel movements are regular,  No Blood in stool or Urine,  No dysuria,  No new skin rashes or bruises,  No new joints pains-aches,  No new weakness, tingling, numbness in any extremity,  No recent weight loss,  No polyuria, polydypsia or polyphagia,  No significant Mental Stressors.  A full 10 point Review of Systems was done, except as stated above, all other Review of Systems were negative  Objective:   Physical Exam BP 116/64  Pulse 104  Temp(Src) 98.1 F (36.7 C) (Temporal)  Resp 16  Ht 5' 10.5" (1.791 m)  Wt 178 lb 9.6 oz (81.012 kg)  BMI 25.26 kg/m2  HEENT - neg. Neck - supple. Nl Thyroid.  No bruits, nodes, JVD Chest - Clear equal BS w/o Rales, rhonchi, wheezes. Cor - Nl HS. RRR w/o sig MGR.   Abd - No palpable organomegaly, masses or tenderness. BS nl. Tenderness in Rt inguinal canal & Rt inguinal ring w/o hernia evident.  GU - Testes unremarkable. MS- FROM w/o  deformities. Muscle power, tone and bulk Nl. Gait Nl. Neuro - No obvious Cr N abnormalities. Sensory, motor and Cerebellar functions appear Nl w/o focal abnormalities.  Assessment & Plan:   1. Strain of right inguinal muscle, initial encounter - trial Rx Prednisone 20 mg #20 taper  - call if problems or Sx's change

## 2014-06-09 ENCOUNTER — Ambulatory Visit: Payer: Self-pay | Admitting: Emergency Medicine

## 2014-08-15 ENCOUNTER — Encounter: Payer: Self-pay | Admitting: Internal Medicine

## 2014-10-06 ENCOUNTER — Encounter: Payer: Self-pay | Admitting: Internal Medicine

## 2014-10-06 ENCOUNTER — Ambulatory Visit (INDEPENDENT_AMBULATORY_CARE_PROVIDER_SITE_OTHER): Payer: Managed Care, Other (non HMO) | Admitting: Internal Medicine

## 2014-10-06 VITALS — BP 118/68 | HR 100 | Temp 97.9°F | Resp 16 | Ht 70.5 in | Wt 173.0 lb

## 2014-10-06 DIAGNOSIS — E559 Vitamin D deficiency, unspecified: Secondary | ICD-10-CM

## 2014-10-06 DIAGNOSIS — Z1212 Encounter for screening for malignant neoplasm of rectum: Secondary | ICD-10-CM

## 2014-10-06 DIAGNOSIS — R7309 Other abnormal glucose: Secondary | ICD-10-CM

## 2014-10-06 DIAGNOSIS — R6889 Other general symptoms and signs: Secondary | ICD-10-CM

## 2014-10-06 DIAGNOSIS — E782 Mixed hyperlipidemia: Secondary | ICD-10-CM

## 2014-10-06 DIAGNOSIS — I10 Essential (primary) hypertension: Secondary | ICD-10-CM

## 2014-10-06 DIAGNOSIS — R0989 Other specified symptoms and signs involving the circulatory and respiratory systems: Secondary | ICD-10-CM

## 2014-10-06 DIAGNOSIS — E349 Endocrine disorder, unspecified: Secondary | ICD-10-CM

## 2014-10-06 DIAGNOSIS — R7989 Other specified abnormal findings of blood chemistry: Secondary | ICD-10-CM

## 2014-10-06 DIAGNOSIS — R945 Abnormal results of liver function studies: Secondary | ICD-10-CM

## 2014-10-06 DIAGNOSIS — R7303 Prediabetes: Secondary | ICD-10-CM

## 2014-10-06 DIAGNOSIS — Z0001 Encounter for general adult medical examination with abnormal findings: Secondary | ICD-10-CM

## 2014-10-06 DIAGNOSIS — Z79899 Other long term (current) drug therapy: Secondary | ICD-10-CM

## 2014-10-06 DIAGNOSIS — Z113 Encounter for screening for infections with a predominantly sexual mode of transmission: Secondary | ICD-10-CM

## 2014-10-06 DIAGNOSIS — Z125 Encounter for screening for malignant neoplasm of prostate: Secondary | ICD-10-CM

## 2014-10-06 DIAGNOSIS — E291 Testicular hypofunction: Secondary | ICD-10-CM

## 2014-10-06 LAB — CBC WITH DIFFERENTIAL/PLATELET
Basophils Absolute: 0.2 10*3/uL — ABNORMAL HIGH (ref 0.0–0.1)
Basophils Relative: 3 % — ABNORMAL HIGH (ref 0–1)
EOS PCT: 7 % — AB (ref 0–5)
Eosinophils Absolute: 0.4 10*3/uL (ref 0.0–0.7)
HEMATOCRIT: 36 % — AB (ref 39.0–52.0)
Hemoglobin: 12.2 g/dL — ABNORMAL LOW (ref 13.0–17.0)
LYMPHS ABS: 2.7 10*3/uL (ref 0.7–4.0)
LYMPHS PCT: 48 % — AB (ref 12–46)
MCH: 30.4 pg (ref 26.0–34.0)
MCHC: 33.9 g/dL (ref 30.0–36.0)
MCV: 89.8 fL (ref 78.0–100.0)
MONO ABS: 0.4 10*3/uL (ref 0.1–1.0)
MONOS PCT: 8 % (ref 3–12)
MPV: 8.7 fL — ABNORMAL LOW (ref 9.4–12.4)
Neutro Abs: 1.9 10*3/uL (ref 1.7–7.7)
Neutrophils Relative %: 34 % — ABNORMAL LOW (ref 43–77)
Platelets: 347 10*3/uL (ref 150–400)
RBC: 4.01 MIL/uL — AB (ref 4.22–5.81)
RDW: 14.7 % (ref 11.5–15.5)
WBC: 5.6 10*3/uL (ref 4.0–10.5)

## 2014-10-06 NOTE — Progress Notes (Signed)
Patient ID: Tony Ware, male   DOB: 1956/10/08, 58 y.o.   MRN: [id number]  Annual Screening Comprehensive Examination  This very nice 58 y.o.male presents for complete physical.  Patient has been followed for labile HTN, Prediabetes, Hyperlipidemia, and Vitamin D Deficiency.   Patient has been followed expectantly since 1995. Patient's BP has been controlled at home.Today's BP: 118/68 mmHg. Patient denies any cardiac symptoms as chest pain, palpitations, shortness of breath, dizziness or ankle swelling.   Patient's hyperlipidemia is not fully controlled with diet. Patient denies myalgias or other medication SE's. Last lipids were Total Chol 177; HDL 53; LDL  107; Trig 86 on 03/10/2014.   Patient has prediabetes with A1c 6.0% since Sept 2012 and patient denies reactive hypoglycemic symptoms, visual blurring, diabetic polys or paresthesias. Last A1c was  6.1% on 03/10/2014.   Finally, patient has history of Vitamin D Deficiency and last vitamin D was 69 on 03/10/2014.  Medication Sig  . acyclovir (ZOVIRAX) 400 MG tablet TAKE ONE TABLET BY MOUTH TWICE DAILY  . buPROPion (WELLBUTRIN XL) 300 MG 24 hr tablet TAKE 1 TABLET BY MOUTH DAILY  . Cholecalciferol (VITAMIN D PO) Take 2,000 Units by mouth daily.   . finasteride (PROSCAR) 5 MG tablet Take 5 mg by mouth daily.    . Tamsulosin HCl (FLOMAX) 0.4 MG CAPS Take 0.4 mg by mouth every evening.     No Known Allergies  Past Medical History  Diagnosis Date  . Labile hypertension   . Osteoarthritis   . Prediabetes   . Other testicular hypofunction    Health Maintenance  Topic Date Due  . INFLUENZA VACCINE  06/18/2014  . TETANUS/TDAP  11/18/2014  . COLONOSCOPY  06/18/2020   Immunization History  Administered Date(s) Administered  . Td 11/18/2004   Past Surgical History  Procedure Laterality Date  . Hernia repair     Family History  Problem Relation Age of Onset  . Hypertension Mother   . Diabetes Mother   . Cancer Father     colon    History   Social History  . Marital Status: Single    Spouse Name: N/A    Number of Children: N/A  . Years of Education: N/A   Occupational History  . Truck Driver x 39 years   Social History Main Topics  . Smoking status: Current Every [place] Smoker  . Smokeless tobacco: Not on file     Comment: smokes 5 cigarettes a Marcoe  . Alcohol Use: 1.0 oz/week    2 drink(s) per week     Comment: beers  . Drug Use: No  . Sexual Activity: Not on file    ROS Constitutional: Denies fever, chills, weight loss/gain, headaches, insomnia, fatigue, night sweats or change in appetite. Eyes: Denies redness, blurred vision, diplopia, discharge, itchy or watery eyes.  ENT: Denies discharge, congestion, post nasal drip, epistaxis, sore throat, earache, hearing loss, dental pain, Tinnitus, Vertigo, Sinus pain or snoring.  Cardio: Denies chest pain, palpitations, irregular heartbeat, syncope, dyspnea, diaphoresis, orthopnea, PND, claudication or edema Respiratory: denies cough, dyspnea, DOE, pleurisy, hoarseness, laryngitis or wheezing.  Gastrointestinal: Denies dysphagia, heartburn, reflux, water brash, pain, cramps, nausea, vomiting, bloating, diarrhea, constipation, hematemesis, melena, hematochezia, jaundice or hemorrhoids Genitourinary: Denies dysuria, frequency, urgency, nocturia, hesitancy, discharge, hematuria or flank pain Musculoskeletal: Denies arthralgia, myalgia, stiffness, Jt. Swelling, pain, limp or strain/sprain. Denies Falls. Skin: Denies puritis, rash, hives, warts, acne, eczema or change in skin lesion Neuro: No weakness, tremor, incoordination, spasms, paresthesia or pain  Psychiatric: Denies confusion, memory loss or sensory loss. Denies Depression. Endocrine: Denies change in weight, skin, hair change, nocturia, and paresthesia, diabetic polys, visual blurring or hyper / hypo glycemic episodes.  Heme/Lymph: No excessive bleeding, bruising or enlarged lymph nodes.  Physical Exam  BP  118/68 mmHg  Pulse 100  Temp(Src) 97.9 F (36.6 C)  Resp 16  Ht 5' 10.5" (1.791 m)  Wt 173 lb (78.472 kg)  BMI 24.46 kg/m2  General Appearance: Well nourished, in no apparent distress. Eyes: PERRLA, EOMs, conjunctiva no swelling or erythema, normal fundi and vessels. Sinuses: No frontal/maxillary tenderness ENT/Mouth: EACs patent / TMs  nl. Nares clear without erythema, swelling, mucoid exudates. Oral hygiene is good. No erythema, swelling, or exudate. Tongue normal, non-obstructing. Tonsils not swollen or erythematous. Hearing normal.  Neck: Supple, thyroid normal. No bruits, nodes or JVD. Respiratory: Respiratory effort normal.  BS equal and clear bilateral without rales, rhonci, wheezing or stridor. Cardio: Heart sounds are normal with regular rate and rhythm and no murmurs, rubs or gallops. Peripheral pulses are normal and equal bilaterally without edema. No aortic or femoral bruits. Chest: symmetric with normal excursions and percussion.  Abdomen: Flat, soft, with bowl sounds. Nontender, no guarding, rebound, hernias, masses, or organomegaly.  Lymphatics: Non tender without lymphadenopathy.  Genitourinary: No hernias.Testes nl. DRE - prostate nl for age - smooth & firm w/o nodules. Musculoskeletal: Full ROM all peripheral extremities, joint stability, 5/5 strength, and normal gait. Skin: Warm and dry without rashes, lesions, cyanosis, clubbing or  ecchymosis.  Neuro: Cranial nerves intact, reflexes equal bilaterally. Normal muscle tone, no cerebellar symptoms. Sensation intact.  Pysch: Awake and oriented X 3with normal affect, insight and judgment appropriate.   Assessment and Plan  1. Annual Screening Examination 2. Hypertension, Labile   3. Hyperlipidemia 4. Pre Diabetes 5. Vitamin D Deficiency 6. Testosterone Deficiency   Continue prudent diet as discussed, weight control, BP monitoring, regular exercise, and medications as discussed.  Discussed med effects and SE's. Routine  screening labs and tests as requested with regular follow-up as recommended.

## 2014-10-06 NOTE — Patient Instructions (Signed)
 Recommend the book "The END of DIETING" by Dr Joel Furman   and the book "The END of DIABETES " by Dr Joel Fuhrman  At [employer] - get book & Audio CD's      Being diabetic has a  300% increased risk for heart attack, stroke, cancer, and alzheimer- type vascular dementia. It is very important that you work harder with diet by avoiding all foods that are white except chicken & fish. Avoid white rice (brown & wild rice is OK), white potatoes (sweetpotatoes in moderation is OK), White bread or wheat bread or anything made out of white flour like bagels, donuts, rolls, buns, biscuits, cakes, pastries, cookies, pizza crust, and pasta (made from white flour & egg whites) - vegetarian pasta or spinach or wheat pasta is OK. Multigrain breads like Arnold's or Pepperidge Farm, or multigrain sandwich thins or flatbreads.  Diet, exercise and weight loss can reverse and cure diabetes in the early stages.  Diet, exercise and weight loss is very important in the control and prevention of complications of diabetes which affects every system in your body, ie. Brain - dementia/stroke, eyes - glaucoma/blindness, heart - heart attack/heart failure, kidneys - dialysis, stomach - gastric paralysis, intestines - malabsorption, nerves - severe painful neuritis, circulation - gangrene & loss of a leg(s), and finally cancer and Alzheimers.    I recommend avoid fried & greasy foods,  sweets/candy, white rice (brown or wild rice or Quinoa is OK), white potatoes (sweet potatoes are OK) - anything made from white flour - bagels, doughnuts, rolls, buns, biscuits,white and wheat breads, pizza crust and traditional pasta made of white flour & egg white(vegetarian pasta or spinach or wheat pasta is OK).  Multi-grain bread is OK - like multi-grain flat bread or sandwich thins. Avoid alcohol in excess. Exercise is also important.    Eat all the vegetables you want - avoid meat, especially red meat and dairy - especially cheese.  Cheese  is the most concentrated form of trans-fats which is the worst thing to clog up our arteries. Veggie cheese is OK which can be found in the fresh produce section at [employer] or [employer] or [employer]  Preventive Care for Adults  A healthy lifestyle and preventive care can promote health and wellness. Preventive health guidelines for men include the following key practices:  A routine yearly physical is a good way to check with your health care provider about your health and preventative screening. It is a chance to share any concerns and updates on your health and to receive a thorough exam.  Visit your dentist for a routine exam and preventative care every 6 months. Brush your teeth twice a [place] and floss once a [place]. Good oral hygiene prevents tooth decay and gum disease.  The frequency of eye exams is based on your age, health, family medical history, use of contact lenses, and other factors. Follow your health care provider's recommendations for frequency of eye exams.  Eat a healthy diet. Foods such as vegetables, fruits, whole grains, low-fat dairy products, and lean protein foods contain the nutrients you need without too many calories. Decrease your intake of foods high in solid fats, added sugars, and salt. Eat the right amount of calories for you.Get information about a proper diet from your health care provider, if necessary.  Regular physical exercise is one of the most important things you can do for your health. Most adults should get at least 150 minutes of moderate-intensity exercise (any activity   that increases your heart rate and causes you to sweat) each week. In addition, most adults need muscle-strengthening exercises on 2 or more days a week.  Maintain a healthy weight. The body mass index (BMI) is a screening tool to identify possible weight problems. It provides an estimate of body fat based on height and weight. Your health care provider can find your BMI and can help  you achieve or maintain a healthy weight.For adults 20 years and older:  A BMI below 18.5 is considered underweight.  A BMI of 18.5 to 24.9 is normal.  A BMI of 25 to 29.9 is considered overweight.  A BMI of 30 and above is considered obese.  Maintain normal blood lipids and cholesterol levels by exercising and minimizing your intake of saturated fat. Eat a balanced diet with plenty of fruit and vegetables. Blood tests for lipids and cholesterol should begin at age 20 and be repeated every 5 years. If your lipid or cholesterol levels are high, you are over 50, or you are at high risk for heart disease, you may need your cholesterol levels checked more frequently.Ongoing high lipid and cholesterol levels should be treated with medicines if diet and exercise are not working.  If you smoke, find out from your health care provider how to quit. If you do not use tobacco, do not start.  Lung cancer screening is recommended for adults aged 55-80 years who are at high risk for developing lung cancer because of a history of smoking. A yearly low-dose CT scan of the lungs is recommended for people who have at least a 30-pack-year history of smoking and are a current smoker or have quit within the past 15 years. A pack year of smoking is smoking an average of 1 pack of cigarettes a Mans for 1 year (for example: 1 pack a [place] for 30 years or 2 packs a [place] for 15 years). Yearly screening should continue until the smoker has stopped smoking for at least 15 years. Yearly screening should be stopped for people who develop a health problem that would prevent them from having lung cancer treatment.  If you choose to drink alcohol, do not have more than 2 drinks per [place]. One drink is considered to be 12 ounces (355 mL) of beer, 5 ounces (148 mL) of wine, or 1.5 ounces (44 mL) of liquor.  Avoid use of street drugs. Do not share needles with anyone. Ask for help if you need support or instructions about stopping the  use of drugs.  High blood pressure causes heart disease and increases the risk of stroke. Your blood pressure should be checked at least every 1-2 years. Ongoing high blood pressure should be treated with medicines, if weight loss and exercise are not effective.  If you are 45-79 years old, ask your health care provider if you should take aspirin to prevent heart disease.  Diabetes screening involves taking a blood sample to check your fasting blood sugar level. This should be done once every 3 years, after age 45, if you are within normal weight and without risk factors for diabetes. Testing should be considered at a younger age or be carried out more frequently if you are overweight and have at least 1 risk factor for diabetes.  Colorectal cancer can be detected and often prevented. Most routine colorectal cancer screening begins at the age of 50 and continues through age 75. However, your health care provider may recommend screening at an earlier age if you have   risk factors for colon cancer. On a yearly basis, your health care provider may provide home test kits to check for hidden blood in the stool. Use of a small camera at the end of a tube to directly examine the colon (sigmoidoscopy or colonoscopy) can detect the earliest forms of colorectal cancer. Talk to your health care provider about this at age 50, when routine screening begins. Direct exam of the colon should be repeated every 5-10 years through age 75, unless early forms of precancerous polyps or small growths are found.   Talk with your health care provider about prostate cancer screening.  Testicular cancer screening isrecommended for adult males. Screening includes self-exam, a health care provider exam, and other screening tests. Consult with your health care provider about any symptoms you have or any concerns you have about testicular cancer.  Use sunscreen. Apply sunscreen liberally and repeatedly throughout the [place]. You should  seek shade when your shadow is shorter than you. Protect yourself by wearing long sleeves, pants, a wide-brimmed hat, and sunglasses year round, whenever you are outdoors.  Once a month, do a whole-body skin exam, using a mirror to look at the skin on your back. Tell your health care provider about new moles, moles that have irregular borders, moles that are larger than a pencil eraser, or moles that have changed in shape or color.  Stay current with required vaccines (immunizations).  Influenza vaccine. All adults should be immunized every year.  Tetanus, diphtheria, and acellular pertussis (Td, Tdap) vaccine. An adult who has not previously received Tdap or who does not know his vaccine status should receive 1 dose of Tdap. This initial dose should be followed by tetanus and diphtheria toxoids (Td) booster doses every 10 years. Adults with an unknown or incomplete history of completing a 3-dose immunization series with Td-containing vaccines should begin or complete a primary immunization series including a Tdap dose. Adults should receive a Td booster every 10 years.  Varicella vaccine. An adult without evidence of immunity to varicella should receive 2 doses or a second dose if he has previously received 1 dose.  Human papillomavirus (HPV) vaccine. Males aged 13-21 years who have not received the vaccine previously should receive the 3-dose series. Males aged 22-26 years may be immunized. Immunization is recommended through the age of 26 years for any male who has sex with males and did not get any or all doses earlier. Immunization is recommended for any person with an immunocompromised condition through the age of 26 years if he did not get any or all doses earlier. During the 3-dose series, the second dose should be obtained 4-8 weeks after the first dose. The third dose should be obtained 24 weeks after the first dose and 16 weeks after the second dose.  Zoster vaccine. One dose is recommended  for adults aged 60 years or older unless certain conditions are present.    PREVNAR  - Pneumococcal 13-valent conjugate (PCV13) vaccine. When indicated, a person who is uncertain of his immunization history and has no record of immunization should receive the PCV13 vaccine. An adult aged 19 years or older who has certain medical conditions and has not been previously immunized should receive 1 dose of PCV13 vaccine. This PCV13 should be followed with a dose of pneumococcal polysaccharide (PPSV23) vaccine. The PPSV23 vaccine dose should be obtained at least 8 weeks after the dose of PCV13 vaccine. An adult aged 19 years or older who has certain medical conditions and previously   received 1 or more doses of PPSV23 vaccine should receive 1 dose of PCV13. The PCV13 vaccine dose should be obtained 1 or more years after the last PPSV23 vaccine dose.    PNEUMOVAX - Pneumococcal polysaccharide (PPSV23) vaccine. When PCV13 is also indicated, PCV13 should be obtained first. All adults aged 65 years and older should be immunized. An adult younger than age 65 years who has certain medical conditions should be immunized. Any person who resides in a nursing home or long-term care facility should be immunized. An adult smoker should be immunized. People with an immunocompromised condition and certain other conditions should receive both PCV13 and PPSV23 vaccines. People with human immunodeficiency virus (HIV) infection should be immunized as soon as possible after diagnosis. Immunization during chemotherapy or radiation therapy should be avoided. Routine use of PPSV23 vaccine is not recommended for American Indians, [place] Natives, or people younger than 65 years unless there are medical conditions that require PPSV23 vaccine. When indicated, people who have unknown immunization and have no record of immunization should receive PPSV23 vaccine. One-time revaccination 5 years after the first dose of PPSV23 is recommended for  people aged 19-64 years who have chronic kidney failure, nephrotic syndrome, asplenia, or immunocompromised conditions. People who received 1-2 doses of PPSV23 before age 65 years should receive another dose of PPSV23 vaccine at age 65 years or later if at least 5 years have passed since the previous dose. Doses of PPSV23 are not needed for people immunized with PPSV23 at or after age 65 years.    Hepatitis A vaccine. Adults who wish to be protected from this disease, have certain high-risk conditions, work with hepatitis A-infected animals, work in hepatitis A research labs, or travel to or work in countries with a high rate of hepatitis A should be immunized. Adults who were previously unvaccinated and who anticipate close contact with an international adoptee during the first 60 days after arrival in the [place] from a country with a high rate of hepatitis A should be immunized.    Hepatitis B vaccine. Adults should be immunized if they wish to be protected from this disease, have certain high-risk conditions, may be exposed to blood or other infectious body fluids, are household contacts or sex partners of hepatitis B positive people, are clients or workers in certain care facilities, or travel to or work in countries with a high rate of hepatitis B.   Preventive Service / Frequency   Ages 40 to 64  Blood pressure check.  Lipid and cholesterol check  Lung cancer screening. / Every year if you are aged 55-80 years and have a 30-pack-year history of smoking and currently smoke or have quit within the past 15 years. Yearly screening is stopped once you have quit smoking for at least 15 years or develop a health problem that would prevent you from having lung cancer treatment.  Fecal occult blood test (FOBT) of stool. / Every year beginning at age 50 and continuing until age 75. You may not have to do this test if you get a colonoscopy every 10 years.  Flexible sigmoidoscopy** or  colonoscopy.** / Every 5 years for a flexible sigmoidoscopy or every 10 years for a colonoscopy beginning at age 50 and continuing until age 75. Screening for abdominal aortic aneurysm (AAA)  by ultrasound is recommended for people who have history of high blood pressure or who are current or former smokers. 

## 2014-10-07 LAB — LIPID PANEL
Cholesterol: 178 mg/dL (ref 0–200)
HDL: 52 mg/dL (ref >39)
LDL CALC: 107 mg/dL — AB (ref 0–99)
Total CHOL/HDL Ratio: 3.4 Ratio
Triglycerides: 93 mg/dL (ref <150)
VLDL: 19 mg/dL (ref 0–40)

## 2014-10-07 LAB — URINALYSIS, MICROSCOPIC ONLY
Bacteria, UA: NONE SEEN
Casts: NONE SEEN
Crystals: NONE SEEN
Squamous Epithelial / LPF: NONE SEEN

## 2014-10-07 LAB — MICROALBUMIN / CREATININE URINE RATIO
CREATININE, URINE: 60.7 mg/dL
MICROALB/CREAT RATIO: 3.3 mg/g (ref 0.0–30.0)
Microalb, Ur: 0.2 mg/dL (ref <2.0)

## 2014-10-07 LAB — BASIC METABOLIC PANEL WITH GFR
BUN: 7 mg/dL (ref 6–23)
CHLORIDE: 103 meq/L (ref 96–112)
CO2: 31 meq/L (ref 19–32)
CREATININE: 1.13 mg/dL (ref 0.50–1.35)
Calcium: 9.6 mg/dL (ref 8.4–10.5)
GFR, Est African American: 82 mL/min
GFR, Est Non African American: 71 mL/min
GLUCOSE: 86 mg/dL (ref 70–99)
Potassium: 4.5 mEq/L (ref 3.5–5.3)
Sodium: 139 mEq/L (ref 135–145)

## 2014-10-07 LAB — HEPATIC FUNCTION PANEL
ALBUMIN: 3.9 g/dL (ref 3.5–5.2)
ALK PHOS: 66 U/L (ref 39–117)
ALT: 11 U/L (ref 0–53)
AST: 16 U/L (ref 0–37)
BILIRUBIN INDIRECT: 0.3 mg/dL (ref 0.2–1.2)
Bilirubin, Direct: 0.1 mg/dL (ref 0.0–0.3)
TOTAL PROTEIN: 7.2 g/dL (ref 6.0–8.3)
Total Bilirubin: 0.4 mg/dL (ref 0.2–1.2)

## 2014-10-07 LAB — MAGNESIUM: Magnesium: 2.4 mg/dL (ref 1.5–2.5)

## 2014-10-07 LAB — HEMOGLOBIN A1C
Hgb A1c MFr Bld: 6.1 % — ABNORMAL HIGH (ref <5.7)
MEAN PLASMA GLUCOSE: 128 mg/dL — AB (ref <117)

## 2014-10-07 LAB — TSH: TSH: 1.437 u[IU]/mL (ref 0.350–4.500)

## 2014-10-07 LAB — VITAMIN B12: VITAMIN B 12: 666 pg/mL (ref 211–911)

## 2014-10-07 LAB — TESTOSTERONE: Testosterone: 380 ng/dL (ref 300–890)

## 2014-10-07 LAB — PSA: PSA: 0.27 ng/mL (ref <=4.00)

## 2014-10-07 LAB — INSULIN, FASTING: INSULIN FASTING, SERUM: 9.9 u[IU]/mL (ref 2.0–19.6)

## 2014-10-07 LAB — VITAMIN D 25 HYDROXY (VIT D DEFICIENCY, FRACTURES): Vit D, 25-Hydroxy: 62 ng/mL (ref 30–100)

## 2014-11-08 ENCOUNTER — Other Ambulatory Visit: Payer: Self-pay

## 2014-11-08 MED ORDER — ATORVASTATIN CALCIUM 80 MG PO TABS: 80.0000 mg | ORAL_TABLET | Freq: Every day | ORAL | Status: DC

## 2014-11-19 ENCOUNTER — Other Ambulatory Visit: Payer: Self-pay | Admitting: Physician Assistant

## 2015-03-25 ENCOUNTER — Other Ambulatory Visit: Payer: Self-pay | Admitting: Physician Assistant

## 2015-04-27 ENCOUNTER — Ambulatory Visit: Payer: Self-pay | Admitting: Internal Medicine

## 2015-05-28 ENCOUNTER — Encounter: Payer: Self-pay | Admitting: Internal Medicine

## 2015-05-28 NOTE — Progress Notes (Signed)
Patient ID: Tony Ware, male   DOB: Oct 24, 1956, 59 y.o.   MRN: [id number]   This very nice 59 y.o. SBM presents for 3 month follow up with Hypertension, Hyperlipidemia, Pre-Diabetes and Vitamin D Deficiency. 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 He relates that he also desires to re-try Chantix for smoking cessation.    Patient is treated for HTN since 1998 & BP has been controlled at home. Today's BP: 108/68 mmHg. Patient has had no complaints of any cardiac type chest pain, palpitations, dyspnea/orthopnea/PND, dizziness, claudication, or dependent edema.   Hyperlipidemia is controlled with diet & meds. Patient denies myalgias or other med SE's. Last Lipids were  Chol 178; HDL 52; LDL  107*; Trig 93 on 10/06/2014.   Also, the patient has history of PreDiabetes with A1c 6.0% in 2011and has had no symptoms of reactive hypoglycemia, diabetic polys, paresthesias or visual blurring.  Last A1c was  6.1% on 10/06/2014.    Further, the patient also has history of Vitamin D Deficiency of 52 on treatment in 2010 and supplements vitamin D without any suspected side-effects. Last vitamin D was 62 on 10/06/2014.     Medication Sig  . acyclovir  400 MG tablet TAKE ONE TABLET BY MOUTH TWICE DAILY  . atorvastatin  80 MG tablet Take 1 tablet (80 mg total) by mouth daily.  Marland Kitchen buPROPion  XL 300 MG 24 hr TAKE 1 TABLET BY MOUTH DAILY  . VITAMIN D Take 2,000 Units by mouth daily.   . finasteride  5 MG tablet Take 5 mg by mouth daily.    . Tamsulosin  0.4 MG  Take 0.4 mg by mouth every evening.     No Known Allergies  PMHx:   Past Medical History  Diagnosis Date  . Labile hypertension   . Osteoarthritis   . Prediabetes   . Other testicular hypofunction    Immunization History  Administered Date(s) Administered  . Td 11/18/2004   Past Surgical History  Procedure Laterality Date  . Hernia repair      FHx:    Reviewed / unchanged  SHx:    Reviewed / unchanged  Systems Review:  Constitutional: Denies fever, chills, wt changes, headaches, insomnia, fatigue, night sweats, change in appetite. Eyes: Denies redness, blurred vision, diplopia, discharge, itchy, watery eyes.  ENT: Denies discharge, congestion, post nasal drip, epistaxis, sore throat, earache, hearing loss, dental pain, tinnitus, vertigo, sinus pain, snoring.  CV: Denies chest pain, palpitations, irregular heartbeat, syncope, dyspnea, diaphoresis, orthopnea, PND, claudication or edema. Respiratory: denies cough, dyspnea, DOE, pleurisy, hoarseness, laryngitis, wheezing.  Gastrointestinal: Denies dysphagia, odynophagia, heartburn, reflux, water brash, abdominal pain or cramps, nausea, vomiting, bloating, diarrhea, constipation, hematemesis, melena, hematochezia  or hemorrhoids. Genitourinary: Denies dysuria, frequency, urgency, nocturia, hesitancy, discharge, hematuria or flank pain. Musculoskeletal: Denies arthralgias, myalgias, stiffness, jt. swelling, pain, limping or strain/sprain.  Skin: Denies pruritus, rash, hives, warts, acne, eczema or change in skin lesion(s). Neuro: No weakness, tremor, incoordination, spasms, paresthesia or pain. Psychiatric: Denies confusion, memory loss or sensory loss. Endo: Denies change in weight, skin or hair change.  Heme/Lymph: No excessive bleeding, bruising or enlarged lymph nodes.  Physical Exam  BP 108/68   Pulse 84  Temp 97.3 F   Resp 16  Ht 5' 10.5"   Wt 178 lb 3.2 oz     BMI 25.20  Appears well nourished and in no distress. Eyes: PERRLA,  EOMs, conjunctiva no swelling or erythema. Sinuses: No frontal/maxillary tenderness ENT/Mouth: EAC's clear, TM's nl w/o erythema, bulging. Nares clear w/o erythema, swelling, exudates. Oropharynx clear without erythema or exudates. Oral hygiene is good. Tongue normal, non obstructing. Hearing intact.  Neck: Supple. Thyroid nl. Car 2+/2+ without  bruits, nodes or JVD. Chest: Respirations nl with BS clear & equal w/o rales, rhonchi, wheezing or stridor.  Cor: Heart sounds normal w/ regular rate and rhythm without sig. murmurs, gallops, clicks, or rubs. Peripheral pulses normal and equal  without edema.  Abdomen: Soft & bowel sounds normal. Non-tender w/o guarding, rebound, hernias, masses, or organomegaly.  Lymphatics: Unremarkable.  Musculoskeletal: Full ROM all peripheral extremities, joint stability, 5/5 strength, and normal gait. Tender over right shoulder deltoid bursae. Skin: Warm, dry without exposed rashes, lesions or ecchymosis apparent.  Neuro: Cranial nerves intact, reflexes equal bilaterally. Sensory-motor testing grossly intact. Tendon reflexes grossly intact.  Pysch: Alert & oriented x 3.  Insight and judgement nl & appropriate. No ideations.  Assessment and Plan:  1. Labile hypertension  - TSH  2. Hyperlipidemia  - Lipid panel  3. Prediabetes  - Hemoglobin A1c - Insulin, random  4. Vitamin D deficiency  - Vit D  25 hydroxy (rtn osteoporosis monitoring)  5. Medication management  - CBC with Differential/Platelet - BASIC METABOLIC PANEL WITH GFR - Hepatic function panel - Magnesium  6. Body mass index (BMI) of 24.0-24.9 in adult   7. Pain in joint, shoulder region, right  - predniSONE (DELTASONE) 20 MG tablet; 1 tab 3 x Suppes for 3 days, then 1 tab 2 x [place] for 3 days, then 1 tab 1 x [place] for 5 days  Dispense: 20 tablet; Refill: 0  8. Tobacco use disorder  - varenicline (CHANTIX CONTINUING MONTH PAK) 1 MG tablet; Take 1/2 to 1 tablet 1 or 2 x daily for nicotine cravings  Dispense: 56 tablet; Refill: 99 - Counseled on importance of smoking cessation, and cessation techniques.    Recommended regular exercise, BP monitoring, weight control, and discussed med and SE's. Recommended labs to assess and monitor clinical status. Further disposition pending results of labs. Over 30 minutes of exam, counseling,  chart review was performed

## 2015-05-28 NOTE — Patient Instructions (Addendum)
Recommend Adult Low dose Aspirin or coated  Aspirin 81 mg daily   To reduce risk of Colon Cancer 20 %,   Skin Cancer 26 % ,   Melanoma 46%   and   Pancreatic cancer 60% ++++++++++++++++++ Vitamin D goal is between 70-100.   Please make sure that you are taking your Vitamin D as directed.   It is very important as a natural anti-inflammatory   helping hair, skin, and nails, as well as reducing stroke and heart attack risk.   It helps your bones and helps with mood.  It also decreases numerous cancer risks so please take it as directed.   Low Vit D is associated with a 200-300% higher risk for CANCER   and 200-300% higher risk for HEART   ATTACK  &  STROKE.   .....................................Marland Kitchen  It is also associated with higher death rate at younger ages,   autoimmune diseases like Rheumatoid arthritis, Lupus, Multiple Sclerosis.     Also many other serious conditions, like depression, Alzheimer's  Dementia, infertility, muscle aches, fatigue, fibromyalgia - just to name a few.  +++++++++++++++++++  Recommend the book "The END of DIETING" by Dr Excell Seltzer   & the book "The END of DIABETES " by Dr Excell Seltzer  At [email or web address] - get book & Audio CD's     Being diabetic has a  300% increased risk for heart attack, stroke, cancer, and alzheimer- type vascular dementia. It is very important that you work harder with diet by avoiding all foods that are white. Avoid white rice (brown & wild rice is OK), white potatoes (sweetpotatoes in moderation is OK), White bread or wheat bread or anything made out of white flour like bagels, donuts, rolls, buns, biscuits, cakes, pastries, cookies, pizza crust, and pasta (made from white flour & egg whites) - vegetarian pasta or spinach or wheat pasta is OK. Multigrain breads like Arnold's or Pepperidge Farm, or multigrain sandwich thins or flatbreads.  Diet, exercise and weight loss can reverse and cure diabetes in the early stages.   Diet, exercise and weight loss is very important in the control and prevention of complications of diabetes which affects every system in your body, ie. Brain - dementia/stroke, eyes - glaucoma/blindness, heart - heart attack/heart failure, kidneys - dialysis, stomach - gastric paralysis, intestines - malabsorption, nerves - severe painful neuritis, circulation - gangrene & loss of a leg(s), and finally cancer and Alzheimers.    I recommend avoid fried & greasy foods,  sweets/candy, white rice (brown or wild rice or Quinoa is OK), white potatoes (sweet potatoes are OK) - anything made from white flour - bagels, doughnuts, rolls, buns, biscuits,white and wheat breads, pizza crust and traditional pasta made of white flour & egg white(vegetarian pasta or spinach or wheat pasta is OK).  Multi-grain bread is OK - like multi-grain flat bread or sandwich thins. Avoid alcohol in excess. Exercise is also important.    Eat all the vegetables you want - avoid meat, especially red meat and dairy - especially cheese.  Cheese is the most concentrated form of trans-fats which is the worst thing to clog up our arteries. Veggie cheese is OK which can be found in the fresh produce section at [facility] or [employer] or [employer]  ++++++++++++++++++++++++++  Shoulder Pain The shoulder is the joint that connects your arms to your body. The bones that form the shoulder joint include the upper arm bone (humerus), the shoulder blade (scapula), and the collarbone (clavicle). The  top of the humerus is shaped like a ball and fits into a rather flat socket on the scapula (glenoid cavity). A combination of muscles and strong, fibrous tissues that connect muscles to bones (tendons) support your shoulder joint and hold the ball in the socket. Small, fluid-filled sacs (bursae) are located in different areas of the joint. They act as cushions between the bones and the overlying soft tissues and help reduce friction between the gliding  tendons and the bone as you move your arm. Your shoulder joint allows a wide range of motion in your arm. This range of motion allows you to do things like scratch your back or throw a ball. However, this range of motion also makes your shoulder more prone to pain from overuse and injury. Causes of shoulder pain can originate from both injury and overuse and usually can be grouped in the following four categories:  Redness, swelling, and pain (inflammation) of the tendon (tendinitis) or the bursae (bursitis).  Instability, such as a dislocation of the joint.  Inflammation of the joint (arthritis).  Broken bone (fracture). HOME CARE INSTRUCTIONS   Apply ice to the sore area.  Put ice in a plastic bag.  Place a towel between your skin and the bag.  Leave the ice on for 15-20 minutes, 3-4 times per [place] for the first 2 days, or as directed by your health care provider.  Stop using cold packs if they do not help with the pain.  If you have a shoulder sling or immobilizer, wear it as long as your caregiver instructs. Only remove it to shower or bathe. Move your arm as little as possible, but keep your hand moving to prevent swelling.  Squeeze a soft ball or foam pad as much as possible to help prevent swelling.  Only take over-the-counter or prescription medicines for pain, discomfort, or fever as directed by your caregiver. SEEK MEDICAL CARE IF:   Your shoulder pain increases, or new pain develops in your arm, hand, or fingers.  Your hand or fingers become cold and numb.  Your pain is not relieved with medicines. SEEK IMMEDIATE MEDICAL CARE IF:   Your arm, hand, or fingers are numb or tingling.  Your arm, hand, or fingers are significantly swollen or turn white or blue. MAKE SURE YOU:   Understand these instructions.  Will watch your condition.  Will get help right away if you are not doing well or get worse. Document Released: 08/14/2005 Document Revised: 03/21/2014 Document  Reviewed: 10/19/2011 [facility] Patient Information 2015 [place], [place]. This information is not intended to replace advice given to you by your health care provider. Make sure you discuss any questions you have with your health care provider. ++++++++++++++++++++++++++++ Nicotine Addiction Nicotine can act as both a stimulant (excites/activates) and a sedative (calms/quiets). Immediately after exposure to nicotine, there is a "kick" caused in part by the drug's stimulation of the adrenal glands and resulting discharge of adrenaline (epinephrine). The rush of adrenaline stimulates the body and causes a sudden release of sugar. This means that smokers are always slightly hyperglycemic. Hyperglycemic means that the blood sugar is high, just like in diabetics. Nicotine also decreases the amount of insulin which helps control sugar levels in the body. There is an increase in blood pressure, breathing, and the rate of heart beats.  In addition, nicotine indirectly causes a release of dopamine in the brain that controls pleasure and motivation. A similar reaction is seen with other drugs of abuse, such as cocaine  and heroin. This dopamine release is thought to cause the pleasurable sensations when smoking. In some different cases, nicotine can also create a calming effect, depending on sensitivity of the smoker's nervous system and the dose of nicotine taken. WHAT HAPPENS WHEN NICOTINE IS TAKEN FOR LONG PERIODS OF TIME?  Long-term use of nicotine results in addiction. It is difficult to stop.  Repeated use of nicotine creates tolerance. Higher doses of nicotine are needed to get the "kick." When nicotine use is stopped, withdrawal may last a month or more. Withdrawal may begin within a few hours after the last cigarette. Symptoms peak within the first few days and may lessen within a few weeks. For some people, however, symptoms may last for months or longer. Withdrawal symptoms include:    Irritability.  Craving.  Learning and attention deficits.  Sleep disturbances.  Increased appetite. Craving for tobacco may last for 6 months or longer. Many behaviors done while using nicotine can also play a part in the severity of withdrawal symptoms. For some people, the feel, smell, and sight of a cigarette and the ritual of obtaining, handling, lighting, and smoking the cigarette are closely linked with the pleasure of smoking. When stopped, they also miss the related behaviors which make the withdrawal or craving worse. While nicotine gum and patches may lessen the drug aspects of withdrawal, cravings often persist. WHAT ARE THE MEDICAL CONSEQUENCES OF NICOTINE USE?  Nicotine addiction accounts for one-third of all cancers. The top cancer caused by tobacco is lung cancer. Lung cancer is the number one cancer killer of both men and women.  Smoking is also associated with cancers of the:  Mouth.  Pharynx.  Larynx.  Esophagus.  Stomach.  Pancreas.  Cervix.  Kidney.  Ureter.  Bladder.  Smoking also causes lung diseases such as lasting (chronic) bronchitis and emphysema.  It worsens asthma in adults and children.  Smoking increases the risk of heart disease, including:  Stroke.  Heart attack.  Vascular disease.  Aneurysm.  Passive or secondary smoke can also increase medical risks including:  Asthma in children.  Sudden Infant Death Syndrome (SIDS).  Additionally, dropped cigarettes are the leading cause of residential fire fatalities.  Nicotine poisoning has been reported from accidental ingestion of tobacco products by children and pets. Death usually results in a few minutes from respiratory failure (when a person stops breathing) caused by paralysis. TREATMENT   Medication. Nicotine replacement medicines such as nicotine gum and the patch are used to stop smoking. These medicines gradually lower the dosage of nicotine in the body. These medicines  do not contain the carbon monoxide and other toxins found in tobacco smoke.  Hypnotherapy.  Relaxation therapy.  Nicotine Anonymous (a 12-step support program). Find times and locations in your local yellow pages. Document Released: 07/10/2004 Document Revised: 01/27/2012 Document Reviewed: 12/31/2013 [facility] Patient Information 2015 [place], [place]. This information is not intended to replace advice given to you by your health care provider. Make sure you discuss any questions you have with your health care provider.

## 2015-05-29 ENCOUNTER — Ambulatory Visit (INDEPENDENT_AMBULATORY_CARE_PROVIDER_SITE_OTHER): Payer: Managed Care, Other (non HMO) | Admitting: Internal Medicine

## 2015-05-29 ENCOUNTER — Encounter: Payer: Self-pay | Admitting: Internal Medicine

## 2015-05-29 VITALS — BP 108/68 | HR 84 | Temp 97.3°F | Resp 16 | Ht 70.5 in | Wt 178.2 lb

## 2015-05-29 DIAGNOSIS — E559 Vitamin D deficiency, unspecified: Secondary | ICD-10-CM

## 2015-05-29 DIAGNOSIS — R0989 Other specified symptoms and signs involving the circulatory and respiratory systems: Secondary | ICD-10-CM

## 2015-05-29 DIAGNOSIS — I10 Essential (primary) hypertension: Secondary | ICD-10-CM

## 2015-05-29 DIAGNOSIS — M25511 Pain in right shoulder: Secondary | ICD-10-CM

## 2015-05-29 DIAGNOSIS — R7309 Other abnormal glucose: Secondary | ICD-10-CM

## 2015-05-29 DIAGNOSIS — Z79899 Other long term (current) drug therapy: Secondary | ICD-10-CM

## 2015-05-29 DIAGNOSIS — F17200 Nicotine dependence, unspecified, uncomplicated: Secondary | ICD-10-CM

## 2015-05-29 DIAGNOSIS — R7303 Prediabetes: Secondary | ICD-10-CM

## 2015-05-29 DIAGNOSIS — E782 Mixed hyperlipidemia: Secondary | ICD-10-CM

## 2015-05-29 DIAGNOSIS — Z6824 Body mass index (BMI) 24.0-24.9, adult: Secondary | ICD-10-CM

## 2015-05-29 LAB — CBC WITH DIFFERENTIAL/PLATELET
Basophils Absolute: 0.1 10*3/uL (ref 0.0–0.1)
Basophils Relative: 1 % (ref 0–1)
Eosinophils Absolute: 0.4 10*3/uL (ref 0.0–0.7)
Eosinophils Relative: 7 % — ABNORMAL HIGH (ref 0–5)
HCT: 37.7 % — ABNORMAL LOW (ref 39.0–52.0)
HEMOGLOBIN: 12.3 g/dL — AB (ref 13.0–17.0)
Lymphocytes Relative: 43 % (ref 12–46)
Lymphs Abs: 2.2 10*3/uL (ref 0.7–4.0)
MCH: 29.6 pg (ref 26.0–34.0)
MCHC: 32.6 g/dL (ref 30.0–36.0)
MCV: 90.6 fL (ref 78.0–100.0)
MONO ABS: 0.5 10*3/uL (ref 0.1–1.0)
MONOS PCT: 10 % (ref 3–12)
MPV: 8.6 fL (ref 8.6–12.4)
NEUTROS PCT: 39 % — AB (ref 43–77)
Neutro Abs: 2 10*3/uL (ref 1.7–7.7)
Platelets: 346 10*3/uL (ref 150–400)
RBC: 4.16 MIL/uL — AB (ref 4.22–5.81)
RDW: 14.3 % (ref 11.5–15.5)
WBC: 5.2 10*3/uL (ref 4.0–10.5)

## 2015-05-29 LAB — HEPATIC FUNCTION PANEL
ALBUMIN: 3.9 g/dL (ref 3.5–5.2)
ALT: 16 U/L (ref 0–53)
AST: 18 U/L (ref 0–37)
Alkaline Phosphatase: 69 U/L (ref 39–117)
Bilirubin, Direct: 0.1 mg/dL (ref 0.0–0.3)
Indirect Bilirubin: 0.3 mg/dL (ref 0.2–1.2)
Total Bilirubin: 0.4 mg/dL (ref 0.2–1.2)
Total Protein: 7 g/dL (ref 6.0–8.3)

## 2015-05-29 LAB — HEMOGLOBIN A1C
Hgb A1c MFr Bld: 6.1 % — ABNORMAL HIGH (ref <5.7)
MEAN PLASMA GLUCOSE: 128 mg/dL — AB (ref <117)

## 2015-05-29 LAB — LIPID PANEL
Cholesterol: 138 mg/dL (ref 0–200)
HDL: 52 mg/dL (ref >=40)
LDL Cholesterol: 68 mg/dL (ref 0–99)
Total CHOL/HDL Ratio: 2.7 Ratio
Triglycerides: 91 mg/dL (ref <150)
VLDL: 18 mg/dL (ref 0–40)

## 2015-05-29 LAB — BASIC METABOLIC PANEL WITH GFR
BUN: 13 mg/dL (ref 6–23)
CO2: 27 mEq/L (ref 19–32)
Calcium: 9.7 mg/dL (ref 8.4–10.5)
Chloride: 102 mEq/L (ref 96–112)
Creat: 1.04 mg/dL (ref 0.50–1.35)
GFR, EST NON AFRICAN AMERICAN: 79 mL/min
GLUCOSE: 89 mg/dL (ref 70–99)
Potassium: 5.2 mEq/L (ref 3.5–5.3)
Sodium: 138 mEq/L (ref 135–145)

## 2015-05-29 LAB — TSH: TSH: 1.63 u[IU]/mL (ref 0.350–4.500)

## 2015-05-29 LAB — MAGNESIUM: Magnesium: 2.3 mg/dL (ref 1.5–2.5)

## 2015-05-29 MED ORDER — PREDNISONE 20 MG PO TABS
ORAL_TABLET | ORAL | Status: DC
Start: 2015-05-29 — End: 2015-09-04

## 2015-05-29 MED ORDER — VARENICLINE TARTRATE 1 MG PO TABS: ORAL_TABLET | ORAL | Status: AC

## 2015-05-30 LAB — VITAMIN D 25 HYDROXY (VIT D DEFICIENCY, FRACTURES): Vit D, 25-Hydroxy: 60 ng/mL (ref 30–100)

## 2015-05-30 LAB — INSULIN, RANDOM: INSULIN: 4.5 u[IU]/mL (ref 2.0–19.6)

## 2015-06-09 ENCOUNTER — Encounter: Payer: Self-pay | Admitting: Gastroenterology

## 2015-09-04 ENCOUNTER — Encounter: Payer: Self-pay | Admitting: Physician Assistant

## 2015-09-04 ENCOUNTER — Ambulatory Visit (INDEPENDENT_AMBULATORY_CARE_PROVIDER_SITE_OTHER): Payer: Managed Care, Other (non HMO) | Admitting: Physician Assistant

## 2015-09-04 VITALS — BP 138/70 | HR 82 | Temp 98.0°F | Resp 16 | Ht 70.5 in | Wt 180.0 lb

## 2015-09-04 DIAGNOSIS — R7303 Prediabetes: Secondary | ICD-10-CM | POA: Diagnosis not present

## 2015-09-04 DIAGNOSIS — I10 Essential (primary) hypertension: Secondary | ICD-10-CM | POA: Diagnosis not present

## 2015-09-04 DIAGNOSIS — Z79899 Other long term (current) drug therapy: Secondary | ICD-10-CM

## 2015-09-04 DIAGNOSIS — E782 Mixed hyperlipidemia: Secondary | ICD-10-CM | POA: Diagnosis not present

## 2015-09-04 DIAGNOSIS — R0989 Other specified symptoms and signs involving the circulatory and respiratory systems: Secondary | ICD-10-CM

## 2015-09-04 DIAGNOSIS — E559 Vitamin D deficiency, unspecified: Secondary | ICD-10-CM | POA: Diagnosis not present

## 2015-09-04 DIAGNOSIS — F17200 Nicotine dependence, unspecified, uncomplicated: Secondary | ICD-10-CM

## 2015-09-04 DIAGNOSIS — E291 Testicular hypofunction: Secondary | ICD-10-CM | POA: Diagnosis not present

## 2015-09-04 DIAGNOSIS — E349 Endocrine disorder, unspecified: Secondary | ICD-10-CM

## 2015-09-04 LAB — CBC WITH DIFFERENTIAL/PLATELET
BASOS PCT: 1 % (ref 0–1)
Basophils Absolute: 0.1 10*3/uL (ref 0.0–0.1)
EOS ABS: 0.4 10*3/uL (ref 0.0–0.7)
Eosinophils Relative: 6 % — ABNORMAL HIGH (ref 0–5)
HCT: 37 % — ABNORMAL LOW (ref 39.0–52.0)
Hemoglobin: 12.1 g/dL — ABNORMAL LOW (ref 13.0–17.0)
Lymphocytes Relative: 34 % (ref 12–46)
Lymphs Abs: 2.3 10*3/uL (ref 0.7–4.0)
MCH: 29.4 pg (ref 26.0–34.0)
MCHC: 32.7 g/dL (ref 30.0–36.0)
MCV: 90 fL (ref 78.0–100.0)
MONO ABS: 0.6 10*3/uL (ref 0.1–1.0)
MONOS PCT: 9 % (ref 3–12)
MPV: 8.9 fL (ref 8.6–12.4)
Neutro Abs: 3.4 10*3/uL (ref 1.7–7.7)
Neutrophils Relative %: 50 % (ref 43–77)
PLATELETS: 310 10*3/uL (ref 150–400)
RBC: 4.11 MIL/uL — ABNORMAL LOW (ref 4.22–5.81)
RDW: 14.7 % (ref 11.5–15.5)
WBC: 6.8 10*3/uL (ref 4.0–10.5)

## 2015-09-04 LAB — HEMOGLOBIN A1C
Hgb A1c MFr Bld: 6 % — ABNORMAL HIGH (ref <5.7)
MEAN PLASMA GLUCOSE: 126 mg/dL — AB (ref <117)

## 2015-09-04 MED ORDER — PREDNISONE 20 MG PO TABS: ORAL_TABLET | ORAL | Status: DC

## 2015-09-04 NOTE — Patient Instructions (Signed)
Please take the prednisone to help decrease inflammation and therefore decrease symptoms. Take it it with food to avoid GI upset. It can cause increased energy but on the other hand it can make it hard to sleep at night so please take it AT [place], it takes 8-12 hours to start working so it will NOT affect your sleeping if you take it at night with your food!!  If you are diabetic it will increase your sugars so decrease carbs and monitor your sugars closely.     If you have a smart phone, please look up Smoke Free app, this will help you stay on track and give you information about money you have saved, life that you have gained back and a ton of more information.   We are giving you chantix for smoking cessation. You can do it! And we are here to help! You may have heard some scary side effects about chantix, the three most common I hear about are nausea, crazy dreams and depression.  However, I like for my patients to try to stay on 1/2 a tablet twice a [place] rather than one tablet twice a [place] as normally prescribed. This helps decrease the chances of side effects and helps save money by making a one month prescription last two months  Please start the prescription this way:  Start 1/2 tablet by mouth once daily after food with a full glass of water for 3 days Then do 1/2 tablet by mouth twice daily for 4 days. During this first week you can smoke, but try to stop after this week.  At this point we have several options: 1) continue on 1/2 tablet twice a K[place]- which I encourage you to do. You can stay on this dose the rest of the time on the medication or if you still feel the need to smoke you can do one of the two options below. 2) do one tablet in the morning and 1/2 in the evening which helps decrease dreams. 3) do one tablet twice a [place].   What if I miss a dose? If you miss a dose, take it as soon as you can. If it is almost time for your next dose, take only that dose. Do not take  double or extra doses.  What should I watch for while using this medicine? Visit your doctor or health care professional for regular check ups. Ask for ongoing advice and encouragement from your doctor or healthcare professional, friends, and family to help you quit. If you smoke while on this medication, quit again  Your mouth may get dry. Chewing sugarless gum or hard candy, and drinking plenty of water may help. Contact your doctor if the problem does not go away or is severe.  You may get drowsy or dizzy. Do not drive, use machinery, or do anything that needs mental alertness until you know how this medicine affects you. Do not stand or sit up quickly, especially if you are an older patient.   The use of this medicine may increase the chance of suicidal thoughts or actions. Pay special attention to how you are responding while on this medicine. Any worsening of mood, or thoughts of suicide or dying should be reported to your health care professional right away.  ADVANTAGES OF QUITTING SMOKING  Within 20 minutes, blood pressure decreases. Your pulse is at normal level.  After 8 hours, carbon monoxide levels in the blood return to normal. Your oxygen level increases.  After 24 hours, the chance of having a heart attack starts to decrease. Your breath, hair, and body stop smelling like smoke.  After 48 hours, damaged nerve endings begin to recover. Your sense of taste and smell improve.  After 72 hours, the body is virtually free of nicotine. Your bronchial tubes relax and breathing becomes easier.  After 2 to 12 weeks, lungs can hold more air. Exercise becomes easier and circulation improves.  After 1 year, the risk of coronary heart disease is cut in half.  After 5 years, the risk of stroke falls to the same as a nonsmoker.  After 10 years, the risk of lung cancer is cut in half and the risk of other cancers decreases significantly.  After 15 years, the risk of coronary heart  disease drops, usually to the level of a nonsmoker.  You will have extra money to spend on things other than cigarettes.   -Make sure you are drinking plenty of fluids to stay hydrated.  -while drinking fluids pinch and hold nose close and swallow, to help open eustachian tubes and drain fluid behind ear drums. -you can do salt water gargles. You can also do 1 TSP liquid Maalox and 1 TSP liquid benadryl- mix/ gargle/ spit  If you are not feeling better in 10-14 days, then please call the office.  Pharyngitis Pharyngitis is redness, pain, and swelling (inflammation) of your pharynx.  CAUSES  Pharyngitis is usually caused by infection. Most of the time, these infections are from viruses (viral) and are part of a cold. However, sometimes pharyngitis is caused by bacteria (bacterial). Pharyngitis can also be caused by allergies. Viral pharyngitis may be spread from person to person by coughing, sneezing, and personal items or utensils (cups, forks, spoons, toothbrushes). Bacterial pharyngitis may be spread from person to person by more intimate contact, such as kissing.  SIGNS AND SYMPTOMS  Symptoms of pharyngitis include:   Sore throat.   Tiredness (fatigue).   Low-grade fever.   Headache.  Joint pain and muscle aches.  Skin rashes.  Swollen lymph nodes.  Plaque-like film on throat or tonsils (often seen with bacterial pharyngitis). DIAGNOSIS  Your health care provider will ask you questions about your illness and your symptoms. Your medical history, along with a physical exam, is often all that is needed to diagnose pharyngitis. Sometimes, a rapid strep test is done. Other lab tests may also be done, depending on the suspected cause.  TREATMENT  Viral pharyngitis will usually get better in 3-4 days without the use of medicine. Bacterial pharyngitis is treated with medicines that kill germs (antibiotics).  HOME CARE INSTRUCTIONS   Drink enough water and fluids to  keep your urine clear or pale yellow.   Only take over-the-counter or prescription medicines as directed by your health care provider:   If you are prescribed antibiotics, make sure you finish them even if you start to feel better.   Do not take aspirin.   Get lots of rest.   Gargle with 8 oz of salt water ( tsp of salt per 1 qt of water) as often as every 1-2 hours to soothe your throat.   Throat lozenges (if you are not at risk for choking) or sprays may be used to soothe your throat. SEEK MEDICAL CARE IF:   You have large, tender lumps in your neck.  You have a rash.  You cough up green, yellow-brown, or bloody spit. SEEK IMMEDIATE MEDICAL CARE IF:   Your neck becomes stiff.  You drool or are unable to swallow liquids.  You vomit or are unable to keep medicines or liquids down.  You have severe pain that does not go away with the use of recommended medicines.  You have trouble breathing (not caused by a stuffy nose). MAKE SURE YOU:   Understand these instructions.  Will watch your condition.  Will get help right away if you are not doing well or get worse. Document Released: 11/04/2005 Document Revised: 08/25/2013 Document Reviewed: 07/12/2013 [facility] Patient Information 2015 [place], [place]. This information is not intended to replace advice given to you by your health care provider. Make sure you discuss any questions you have with your health care provider.

## 2015-09-04 NOTE — Progress Notes (Signed)
Assessment and Plan:  1. Hypertension -Continue medication, monitor blood pressure at home. Continue DASH diet.  Reminder to go to the ER if any CP, SOB, nausea, dizziness, severe HA, changes vision/speech, left arm numbness and tingling and jaw pain.  2. Cholesterol -Continue diet and exercise. Check cholesterol.   3. Prediabetes  -Continue diet and exercise. Check A1C  4. Vitamin D Def - check level and continue medications.   5. Smoking cessation-   commended patient for quitting and reviewed strategies for preventing relapses, continue to try chantix  6. Sore throat Prednisone, check labs, no fever/chills, likely viral or allergy- if it does not get better with smoking history will refer to ENT  Continue diet and meds as discussed. Further disposition pending results of labs. Over 30 minutes of exam, counseling, chart review, and critical decision making was performed  HPI 59 y.o. AA male  presents for 3 month follow up on hypertension, cholesterol, prediabetes, and vitamin D deficiency.   His blood pressure has been controlled at home, today their BP is BP: 138/70 mmHg  He does not workout. He denies chest pain, shortness of breath, dizziness. Has had sore throat since Friday, was out of work Saturday, has been gargling with warm salt water, no fever/chills, difficult to swallow with sinus congestion.   He is on cholesterol medication and denies myalgias. His cholesterol is at goal. The cholesterol last visit was:   Lab Results  Component Value Date   CHOL 138 05/29/2015   HDL 52 05/29/2015   LDLCALC 68 05/29/2015   TRIG 91 05/29/2015   CHOLHDL 2.7 05/29/2015    He has been working on diet and exercise for prediabetes, and denies paresthesia of the feet, polydipsia, polyuria and visual disturbances. Last A1C in the office was:  Lab Results  Component Value Date   HGBA1C 6.1* 05/29/2015   Patient is on Vitamin D supplement.   Lab Results  Component Value Date   VD25OH  60 05/29/2015     He does smoke, last visit was given chantix for smoking cessation last visit, he has not had a cig since August 8th.     Current Medications:  Current Outpatient Prescriptions on File Prior to Visit  Medication Sig Dispense Refill  . acyclovir (ZOVIRAX) 400 MG tablet TAKE ONE TABLET BY MOUTH TWICE DAILY 60 tablet 5  . atorvastatin (LIPITOR) 80 MG tablet Take 1 tablet (80 mg total) by mouth daily. 90 tablet 1  . buPROPion (WELLBUTRIN XL) 300 MG 24 hr tablet TAKE 1 TABLET BY MOUTH DAILY 30 tablet 4  . Cholecalciferol (VITAMIN D PO) Take 2,000 Units by mouth daily.     . finasteride (PROSCAR) 5 MG tablet Take 5 mg by mouth daily.      . Tamsulosin HCl (FLOMAX) 0.4 MG CAPS Take 0.4 mg by mouth every evening.      . varenicline (CHANTIX CONTINUING MONTH PAK) 1 MG tablet Take 1/2 to 1 tablet 1 or 2 x daily for nicotine cravings 56 tablet 99   No current facility-administered medications on file prior to visit.   Medical History:  Past Medical History  Diagnosis Date  . Labile hypertension   . Osteoarthritis   . Prediabetes   . Other testicular hypofunction    Allergies: No Known Allergies   Review of Systems:  Review of Systems  Constitutional: Negative.  Negative for weight loss and malaise/fatigue.  HENT: Positive for congestion and sore throat. Negative for ear discharge, ear pain, hearing loss,  nosebleeds and tinnitus.   Eyes: Negative.   Respiratory: Negative.  Negative for stridor.   Cardiovascular: Negative.   Gastrointestinal: Negative.   Genitourinary: Negative.   Musculoskeletal: Negative.   Skin: Negative.   Neurological: Negative.  Negative for weakness and headaches.  Endo/Heme/Allergies: Negative.   Psychiatric/Behavioral: Negative.     Family history- Review and unchanged Social history- Review and unchanged Physical Exam: BP 138/70 mmHg  Pulse 82  Temp(Src) 98 F (36.7 C) (Temporal)  Resp 16  Ht 5' 10.5" (1.791 m)  Wt 180 lb (81.647 kg)   BMI 25.45 kg/m2 Wt Readings from Last 3 Encounters:  09/04/15 180 lb (81.647 kg)  05/29/15 178 lb 3.2 oz (80.831 kg)  10/06/14 173 lb (78.472 kg)   General Appearance: Well nourished, in no apparent distress. Eyes: PERRLA, EOMs, conjunctiva no swelling or erythema Sinuses: No Frontal/maxillary tenderness ENT/Mouth: Ext aud canals clear, TMs without erythema, bulging. No erythema, swelling, or exudate on post pharynx.  Tonsils not swollen or erythematous. Hearing normal.  Neck: Supple, thyroid normal.  Respiratory: Respiratory effort normal, BS equal bilaterally without rales, rhonchi, wheezing or stridor.  Cardio: RRR with no MRGs. Brisk peripheral pulses without edema.  Abdomen: Soft, + BS,  Non tender, no guarding, rebound, hernias, masses. Lymphatics: Non tender without lymphadenopathy.  Musculoskeletal: Full ROM, 5/5 strength, Normal gait Skin: Warm, dry without rashes, lesions, ecchymosis.  Neuro: Cranial nerves intact. Normal muscle tone, no cerebellar symptoms. Psych: Awake and oriented X 3, normal affect, Insight and Judgment appropriate.    Vicie Mutters, PA-C 8:55 AM [facility]

## 2015-09-05 LAB — VITAMIN D 25 HYDROXY (VIT D DEFICIENCY, FRACTURES): VIT D 25 HYDROXY: 50 ng/mL (ref 30–100)

## 2015-09-05 LAB — MAGNESIUM: Magnesium: 2.2 mg/dL (ref 1.5–2.5)

## 2015-09-05 LAB — LIPID PANEL
CHOLESTEROL: 146 mg/dL (ref 125–200)
HDL: 64 mg/dL (ref >=40)
LDL CALC: 59 mg/dL (ref <130)
TRIGLYCERIDES: 117 mg/dL (ref <150)
Total CHOL/HDL Ratio: 2.3 Ratio (ref <=5.0)
VLDL: 23 mg/dL (ref <30)

## 2015-09-05 LAB — BASIC METABOLIC PANEL WITH GFR
BUN: 9 mg/dL (ref 7–25)
CO2: 25 mmol/L (ref 20–31)
CREATININE: 1.04 mg/dL (ref 0.70–1.33)
Calcium: 9.4 mg/dL (ref 8.6–10.3)
Chloride: 103 mmol/L (ref 98–110)
GFR, Est African American: >89 mL/min (ref >=60)
GFR, Est Non African American: 78 mL/min (ref >=60)
Glucose, Bld: 105 mg/dL — ABNORMAL HIGH (ref 65–99)
Potassium: 4.6 mmol/L (ref 3.5–5.3)
SODIUM: 138 mmol/L (ref 135–146)

## 2015-09-05 LAB — HEPATIC FUNCTION PANEL
ALT: 21 U/L (ref 9–46)
AST: 22 U/L (ref 10–35)
Albumin: 3.9 g/dL (ref 3.6–5.1)
Alkaline Phosphatase: 65 U/L (ref 40–115)
BILIRUBIN DIRECT: 0.1 mg/dL (ref <=0.2)
Indirect Bilirubin: 0.3 mg/dL (ref 0.2–1.2)
Total Bilirubin: 0.4 mg/dL (ref 0.2–1.2)
Total Protein: 7.2 g/dL (ref 6.1–8.1)

## 2015-09-05 LAB — INSULIN, FASTING: Insulin fasting, serum: 4.5 u[IU]/mL (ref 2.0–19.6)

## 2015-09-05 LAB — TSH: TSH: 1.937 u[IU]/mL (ref 0.350–4.500)

## 2015-10-23 ENCOUNTER — Encounter: Payer: Self-pay | Admitting: Internal Medicine

## 2015-11-21 ENCOUNTER — Other Ambulatory Visit: Payer: Self-pay

## 2015-11-21 MED ORDER — ACYCLOVIR 400 MG PO TABS: 400.0000 mg | ORAL_TABLET | Freq: Two times a day (BID) | ORAL | Status: DC

## 2015-12-24 NOTE — Patient Instructions (Signed)
Tobacco Use Disorder Tobacco use disorder (TUD) is a mental disorder. It is the long-term use of tobacco in spite of related health problems or difficulty with normal life activities. Tobacco is most commonly smoked as cigarettes and less commonly as cigars or pipes. Smokeless chewing tobacco and snuff are also popular. People with TUD get a feeling of extreme pleasure (euphoria) from using tobacco and have a desire to use it again and again. Repeated use of tobacco can cause problems. The addictive effects of tobacco are due mainly tothe ingredient nicotine. Nicotine also causes a rush of adrenaline (epinephrine) in the body. This leads to increased blood pressure, heart rate, and breathing rate. These changes may cause problems for people with high blood pressure, weak hearts, or lung disease. High doses of nicotine in children and pets can lead to seizures and death.  Tobacco contains a number of other unsafe chemicals. These chemicals are especially harmful when inhaled as smoke and can damage almost every organ in the body. Smokers live shorter lives than nonsmokers and are at risk of dying from a number of diseases and cancers. Tobacco smoke can also cause health problems for nonsmokers (due to inhaling secondhand smoke). Smoking is also a fire hazard.  TUD usually starts in the late teenage years and is most common in young adults between the ages of 76 and 67 years. People who start smoking earlier in life are more likely to continue smoking as adults. TUD is somewhat more common in men than women. People with TUD are at higher risk for using alcohol and other drugs of abuse. RISK FACTORS Risk factors for TUD include:   Having family members with the disorder.  Being around people who use tobacco.  Having an existing mental health issue such as schizophrenia, depression, bipolar disorder, ADHD, or posttraumatic stress disorder (PTSD). SIGNS AND SYMPTOMS  People with tobacco use disorder have  two or more of the following signs and symptoms within 12 months:   Use of more tobacco over a longer period than intended.   Not able to cut down or control tobacco use.   A lot of time spent obtaining or using tobacco.   Strong desire or urge to use tobacco (craving). Cravings may last for 6 months or longer after quitting.  Use of tobacco even when use leads to major problems at work, school, or home.   Use of tobacco even when use leads to relationship problems.   Giving up or cutting down on important life activities because of tobacco use.   Repeatedly using tobacco in situations where it puts you or others in physical danger, like smoking in bed.   Use of tobacco even when it is known that a physical or mental problem is likely related to tobacco use.   Physical problems are numerous and may include chronic bronchitis, emphysema, lung and other cancers, gum disease, high blood pressure, heart disease, and stroke.   Mental problems caused by tobacco may include difficulty sleeping and anxiety.  Need to use greater amounts of tobacco to get the same effect. This means you have developed a tolerance.   Withdrawal symptoms as a result of stopping or rapidly cutting back use. These symptoms may last a month or more after quitting and include the following:   Depressed, anxious, or irritable mood.   Difficulty concentrating.   Increased appetite.  Restlessness or trouble sleeping.   Use of tobacco to avoid withdrawal symptoms. DIAGNOSIS  Tobacco use disorder is diagnosed by  your health care provider. A diagnosis may be made by:  Your health care provider asking questions about your tobacco use and any problems it may be causing.  A physical exam.  Lab tests.  You may be referred to a mental health professional or addiction specialist. The severity of tobacco use disorder depends on the number of signs and symptoms you have:   Mild--Two or three  symptoms.  Moderate--Four or five symptoms.   Severe--Six or more symptoms.  TREATMENT  Many people with tobacco use disorder are unable to quit on their own and need help. Treatment options include the following:  Nicotine replacement therapy (NRT). NRT provides nicotine without the other harmful chemicals in tobacco. NRT gradually lowers the dosage of nicotine in the body and reduces withdrawal symptoms. NRT is available in over-the-counter forms (gum, lozenges, and skin patches) as well as prescription forms (mouth inhaler and nasal spray).  Medicines.This may include:  Antidepressant medicine that may reduce nicotine cravings.  A medicine that acts on nicotine receptors in the brain to reduce cravings and withdrawal symptoms. It may also block the effects of tobacco in people with TUD who relapse.  Counseling or talk therapy. A form of talk therapy called behavioral therapy is commonly used to treat people with TUD. Behavioral therapy looks at triggers for tobacco use, how to avoid them, and how to cope with cravings. It is most effective in person or by phone but is also available in self-help forms (books and Internet websites).  Support groups. These provide emotional support, advice, and guidance for quitting tobacco. The most effective treatment for TUD is usually a combination of medicine, talk therapy, and support groups. HOME CARE INSTRUCTIONS  Keep all follow-up visits as directed by your health care provider. This is important.  Take medicines only as directed by your health care provider.  Check with your health care provider before starting new prescription or over-the-counter medicines. SEEK MEDICAL CARE IF:  You are not able to take your medicines as prescribed.  Treatment is not helping your TUD and your symptoms get worse. SEEK IMMEDIATE MEDICAL CARE IF:  You have serious thoughts about hurting yourself or others.  You have trouble breathing, chest pain,  sudden weakness, or sudden numbness in part of your body.   ============================================================================ 9 Ways to Naturally Increase Testosterone Levels  1.   Lose Weight  If you're overweight, shedding the excess pounds may increase your testosterone levels, according to research presented at the Endocrine Society's 2012 meeting. Overweight men are more likely to have low testosterone levels to begin with, so this is an important trick to increase your body's testosterone production when you need it most.  2.   High-Intensity Exercise like Peak Fitness   Short intense exercise has a proven positive effect on increasing testosterone levels and preventing its decline. That's unlike aerobics or prolonged moderate exercise, which have shown to have negative or no effect on testosterone levels. Having a whey protein meal after exercise can further enhance the satiety/testosterone-boosting impact (hunger hormones cause the opposite effect on your testosterone and libido). Here's a summary of what a typical high-intensity Peak Fitness routine might look like: " Warm up for three minutes  " Exercise as hard and fast as you can for 30 seconds. You should feel like you couldn't possibly go on another few seconds  " Recover at a slow to moderate pace for 90 seconds  " Repeat the high intensity exercise and recovery 7 more times .  3.   Consume Plenty of Zinc  The mineral zinc is important for testosterone production, and supplementing your diet for as little as six weeks has been shown to cause a marked improvement in testosterone among men with low levels.1 Likewise, research has shown that restricting dietary sources of zinc leads to a significant decrease in testosterone, while zinc supplementation increases it2 -- and even protects men from exercised-induced reductions in testosterone levels.3 It's estimated that up to 69 percent of adults over the age of 60 may have  lower than recommended zinc intakes; even when dietary supplements were added in, an estimated 20-25 percent of older adults still had inadequate zinc intakes, according to a [employer] and Nutrition Examination Survey.4 Your diet is the best source of zinc; along with protein-rich foods like meats and fish, other good dietary sources of zinc include raw milk, raw cheese, beans, and yogurt or kefir made from raw milk. It can be difficult to obtain enough dietary zinc if you're a vegetarian, and also for meat-eaters as well, largely because of conventional farming methods that rely heavily on chemical fertilizers and pesticides. These chemicals deplete the soil of nutrients ... nutrients like zinc that must be absorbed by plants in order to be passed on to you. In many cases, you may further deplete the nutrients in your food by the way you prepare it. For most food, cooking it will drastically reduce its levels of nutrients like zinc . particularly over-cooking, which many people do. If you decide to use a zinc supplement, stick to a dosage of less than 40 mg a Ruder, as this is the recommended adult upper limit. Taking too much zinc can interfere with your body's ability to absorb other minerals, especially copper, and may cause nausea as a side effect.  4.   Strength Training  In addition to Peak Fitness, strength training is also known to boost testosterone levels, provided you are doing so intensely enough. When strength training to boost testosterone, you'll want to increase the weight and lower your number of reps, and then focus on exercises that work a large number of muscles, such as dead lifts or squats.  You can "turbo-charge" your weight training by going slower. By slowing down your movement, you're actually turning it into a high-intensity exercise. Super Slow movement allows your muscle, at the microscopic level, to access the maximum number of cross-bridges between the protein filaments  that produce movement in the muscle.   5.   Optimize Your Vitamin D Levels  Vitamin D, a steroid hormone, is essential for the healthy development of the nucleus of the sperm cell, and helps maintain semen quality and sperm count. Vitamin D also increases levels of testosterone, which may boost libido. In one study, overweight men who were given vitamin D supplements had a significant increase in testosterone levels after one year.5   6.   Reduce Stress  When you're under a lot of stress, your body releases high levels of the stress hormone cortisol. This hormone actually blocks the effects of testosterone,6 presumably because, from a biological standpoint, testosterone-associated behaviors (mating, competing, aggression) may have lowered your chances of survival in an emergency (hence, the "fight or flight" response is dominant, courtesy of cortisol).  7.   Limit or Eliminate Sugar from Your Diet  Testosterone levels decrease after you eat sugar, which is likely because the sugar leads to a high insulin level, another factor leading to low testosterone.7 Based on USDA estimates, the average  American consumes 12 teaspoons of sugar a Dillen, which equates to about TWO TONS of sugar during a lifetime.  8.   Eat Healthy Fats  By healthy, this means not only mono- and polyunsaturated fats, like that found in avocadoes and nuts, but also saturated, as these are essential for building testosterone. Research shows that a diet with less than 40 percent of energy as fat (and that mainly from animal sources, i.e. saturated) lead to a decrease in testosterone levels. ie eat less animal products - as Meat , poultry and dairy. Experts agree that the ideal diet includes somewhere between 50-70 percent fat.  It's important to understand that your body requires saturated fats from animal and vegetable sources (such as meat, dairy, certain oils, and tropical plants like coconut) for optimal functioning, and if you  neglect this important food group in favor of sugar, grains and other starchy carbs, your health and weight are almost guaranteed to suffer. Examples of healthy fats you can eat more of to give your testosterone levels a boost include: Olives and Olive oil  Coconuts and coconut oil Butter made from raw grass-fed organic milk Raw nuts, such as, almonds or pecans Organic pastured egg yolks Avocados Grass-fed meats Palm oil Unheated organic nut oils   9.   Boost Your Intake of Branch Chain Amino Acids (BCAA) from Foods Like [employer] suggests that BCAAs result in higher testosterone levels, particularly when taken along with resistance training. While BCAAs are available in supplement form, you'll find the highest concentrations of BCAAs like leucine in whey protein. Even when getting leucine from your natural food supply, it's often wasted or used as a building block instead of an anabolic agent. So to create the correct anabolic environment, you need to boost leucine consumption way beyond mere maintenance levels. That said, keep in mind that using leucine as a free form amino acid can be highly counterproductive as when free form amino acids are artificially administrated, they rapidly enter your circulation while disrupting insulin function, and impairing your body's glycemic control. Food-based leucine is really the ideal form that can benefit your muscles without side effects.  +++++++++++++++++++++++++++++++++++++++++++++++++++++++++++++++++++++++ Recommend Adult Low Dose Aspirin or   coated  Aspirin 81 mg daily   To reduce risk of Colon Cancer 20 %,   Skin Cancer 26 % ,   Melanoma 46%   and   Pancreatic cancer 60%   ++++++++++++++++++++++++++++++++++++++++++++++++++++++ Vitamin D goal   is between 70-100.   Please make sure that you are taking your Vitamin D as directed.   It is very important as a natural anti-inflammatory   helping hair, skin, and nails, as well  as reducing stroke and heart attack risk.   It helps your bones and helps with mood.  It also decreases numerous cancer risks so please take it as directed.   Low Vit D is associated with a 200-300% higher risk for CANCER   and 200-300% higher risk for HEART   ATTACK  &  STROKE.   .....................................Marland Kitchen  It is also associated with higher death rate at younger ages,   autoimmune diseases like Rheumatoid arthritis, Lupus, Multiple Sclerosis.     Also many other serious conditions, like depression, Alzheimer's  Dementia, infertility, muscle aches, fatigue, fibromyalgia - just to name a few.  ++++++++++++++++++++++++++++++++++++++++++++++++  Recommend the book "The END of DIETING" by Dr Excell Seltzer   & the book "The END of DIABETES " by Dr Excell Seltzer  At [email or web address] -  get book & Audio CD's     Being diabetic has a  300% increased risk for heart attack, stroke, cancer, and alzheimer- type vascular dementia. It is very important that you work harder with diet by avoiding all foods that are white. Avoid white rice (brown & wild rice is OK), white potatoes (sweetpotatoes in moderation is OK), White bread or wheat bread or anything made out of white flour like bagels, donuts, rolls, buns, biscuits, cakes, pastries, cookies, pizza crust, and pasta (made from white flour & egg whites) - vegetarian pasta or spinach or wheat pasta is OK. Multigrain breads like Arnold's or Pepperidge Farm, or multigrain sandwich thins or flatbreads.  Diet, exercise and weight loss can reverse and cure diabetes in the early stages.  Diet, exercise and weight loss is very important in the control and prevention of complications of diabetes which affects every system in your body, ie. Brain - dementia/stroke, eyes - glaucoma/blindness, heart - heart attack/heart failure, kidneys - dialysis, stomach - gastric paralysis, intestines - malabsorption, nerves - severe painful neuritis, circulation - gangrene &  loss of a leg(s), and finally cancer and Alzheimers.    I recommend avoid fried & greasy foods,  sweets/candy, white rice (brown or wild rice or Quinoa is OK), white potatoes (sweet potatoes are OK) - anything made from white flour - bagels, doughnuts, rolls, buns, biscuits,white and wheat breads, pizza crust and traditional pasta made of white flour & egg white(vegetarian pasta or spinach or wheat pasta is OK).  Multi-grain bread is OK - like multi-grain flat bread or sandwich thins. Avoid alcohol in excess. Exercise is also important.    Eat all the vegetables you want - avoid meat, especially red meat and dairy - especially cheese.  Cheese is the most concentrated form of trans-fats which is the worst thing to clog up our arteries. Veggie cheese is OK which can be found in the fresh produce section at [employer] or [employer] or [employer]  ++++++++++++++++++++++++++++++++++++++++++++++++++ DASH Eating Plan  DASH stands for "Dietary Approaches to Stop Hypertension."   The DASH eating plan is a healthy eating plan that has been shown to reduce high blood pressure (hypertension). Additional health benefits may include reducing the risk of type 2 diabetes mellitus, heart disease, and stroke. The DASH eating plan may also help with weight loss.  WHAT DO I NEED TO KNOW ABOUT THE DASH EATING PLAN?  For the DASH eating plan, you will follow these general guidelines:  Choose foods with a percent daily value for sodium of less than 5% (as listed on the food label).  Use salt-free seasonings or herbs instead of table salt or sea salt.  Check with your health care provider or pharmacist before using salt substitutes.  Eat lower-sodium products, often labeled as "lower sodium" or "no salt added."  Eat fresh foods.  Eat more vegetables, fruits, and low-fat dairy products.    Choose whole grains. Look for the word "whole" as the first word in the ingredient list.  Choose fish   Limit  sweets, desserts, sugars, and sugary drinks.  Choose heart-healthy fats.  Eat veggie cheese   Eat more home-cooked food and less restaurant, buffet, and fast food.  Limit fried foods.  Cook foods using methods other than frying.  Limit canned vegetables. If you do use them, rinse them well to decrease the sodium.  When eating at a restaurant, ask that your food be prepared with less salt, or no salt if possible.  WHAT FOODS CAN I EAT?  Read Dr Fara Olden Fuhrman's books on The End of Dieting & The End of Diabetes  Grains  Whole grain or whole wheat bread. Brown rice. Whole grain or whole wheat pasta. Quinoa, bulgur, and whole grain cereals. Low-sodium cereals. Corn or whole wheat flour tortillas. Whole grain cornbread. Whole grain crackers. Low-sodium crackers.  Vegetables  Fresh or frozen vegetables (raw, steamed, roasted, or grilled). Low-sodium or reduced-sodium tomato and vegetable juices. Low-sodium or reduced-sodium tomato sauce and paste. Low-sodium or reduced-sodium canned vegetables.   Fruits  All fresh, canned (in natural juice), or frozen fruits.  Protein Products   All fish and seafood.  Dried beans, peas, or lentils. Unsalted nuts and seeds. Unsalted canned beans.  Dairy  Low-fat dairy products, such as skim or 1% milk, 2% or reduced-fat cheeses, low-fat ricotta or cottage cheese, or plain low-fat yogurt. Low-sodium or reduced-sodium cheeses.  Fats and Oils  Tub margarines without trans fats. Light or reduced-fat mayonnaise and salad dressings (reduced sodium). Avocado. Safflower, olive, or canola oils. Natural peanut or almond butter.  Other  Unsalted popcorn and pretzels. The items listed above may not be a complete list of recommended foods or beverages. Contact your dietitian for more options.  +++++++++++++++++++++++++++++++++++++++++++  WHAT FOODS ARE NOT RECOMMENDED?  Grains/ White flour or wheat flour  White bread. White pasta.  White rice. Refined cornbread. Bagels and croissants. Crackers that contain trans fat.  Vegetables  Creamed or fried vegetables. Vegetables in a . Regular canned vegetables. Regular canned tomato sauce and paste. Regular tomato and vegetable juices.  Fruits  Dried fruits. Canned fruit in light or heavy syrup. Fruit juice.  Meat and Other Protein Products  Meat in general - RED mwaet & White meat.  Fatty cuts of meat. Ribs, chicken wings, bacon, sausage, bologna, salami, chitterlings, fatback, hot dogs, bratwurst, and packaged luncheon meats.  Dairy  Whole or 2% milk, cream, half-and-half, and cream cheese. Whole-fat or sweetened yogurt. Full-fat cheeses or blue cheese. Nondairy creamers and whipped toppings. Processed cheese, cheese spreads, or cheese curds.  Condiments  Onion and garlic salt, seasoned salt, table salt, and sea salt. Canned and packaged gravies. Worcestershire sauce. Tartar sauce. Barbecue sauce. Teriyaki sauce. Soy sauce, including reduced sodium. Steak sauce. Fish sauce. Oyster sauce. Cocktail sauce. Horseradish. Ketchup and mustard. Meat flavorings and tenderizers. Bouillon cubes. Hot sauce. Tabasco sauce. Marinades. Taco seasonings. Relishes.  Fats and Oils Butter, stick margarine, lard, shortening and bacon fat. Coconut, palm kernel, or palm oils. Regular salad dressings.  Pickles and olives. Salted popcorn and pretzels.  The items listed above may not be a complete list of foods and beverages to avoid.  ++++++++++++++++++++++++++++++++++++++++++++++++++++++++++++++++++++   Preventive Care for Adults  A healthy lifestyle and preventive care can promote health and wellness. Preventive health guidelines for men include the following key practices:  A routine yearly physical is a good way to check with your health care provider about your health and preventative screening. It is a chance to share any concerns and updates on your health and to receive a thorough  exam.  Visit your dentist for a routine exam and preventative care every 6 months. Brush your teeth twice a Muhammed and floss once a Kincer. Good oral hygiene prevents tooth decay and gum disease.  The frequency of eye exams is based on your age, health, family medical history, use of contact lenses, and other factors. Follow your health care provider's recommendations for frequency of eye exams.  Eat a  healthy diet. Foods such as vegetables, fruits, whole grains, low-fat dairy products, and lean protein foods contain the nutrients you need without too many calories. Decrease your intake of foods high in solid fats, added sugars, and salt. Eat the right amount of calories for you.Get information about a proper diet from your health care provider, if necessary.  Regular physical exercise is one of the most important things you can do for your health. Most adults should get at least 150 minutes of moderate-intensity exercise (any activity that increases your heart rate and causes you to sweat) each week. In addition, most adults need muscle-strengthening exercises on 2 or more days a week.  Maintain a healthy weight. The body mass index (BMI) is a screening tool to identify possible weight problems. It provides an estimate of body fat based on height and weight. Your health care provider can find your BMI and can help you achieve or maintain a healthy weight.For adults 20 years and older:  A BMI below 18.5 is considered underweight.  A BMI of 18.5 to 24.9 is normal.  A BMI of 25 to 29.9 is considered overweight.  A BMI of 30 and above is considered obese.  Maintain normal blood lipids and cholesterol levels by exercising and minimizing your intake of saturated fat. Eat a balanced diet with plenty of fruit and vegetables. Blood tests for lipids and cholesterol should begin at age 72 and be repeated every 5 years. If your lipid or cholesterol levels are high, you are over 50, or you are at high risk for  heart disease, you may need your cholesterol levels checked more frequently.Ongoing high lipid and cholesterol levels should be treated with medicines if diet and exercise are not working.  If you smoke, find out from your health care provider how to quit. If you do not use tobacco, do not start.  Lung cancer screening is recommended for adults aged 27-80 years who are at high risk for developing lung cancer because of a history of smoking. A yearly low-dose CT scan of the lungs is recommended for people who have at least a 30-pack-year history of smoking and are a current smoker or have quit within the past 15 years. A pack year of smoking is smoking an average of 1 pack of cigarettes a Keefe for 1 year (for example: 1 pack a Shimizu for 30 years or 2 packs a Fay for 15 years). Yearly screening should continue until the smoker has stopped smoking for at least 15 years. Yearly screening should be stopped for people who develop a health problem that would prevent them from having lung cancer treatment.  If you choose to drink alcohol, do not have more than 2 drinks per [place]. One drink is considered to be 12 ounces (355 mL) of beer, 5 ounces (148 mL) of wine, or 1.5 ounces (44 mL) of liquor.  Avoid use of street drugs. Do not share needles with anyone. Ask for help if you need support or instructions about stopping the use of drugs.  High blood pressure causes heart disease and increases the risk of stroke. Your blood pressure should be checked at least every 1-2 years. Ongoing high blood pressure should be treated with medicines, if weight loss and exercise are not effective.  If you are 17-10 years old, ask your health care provider if you should take aspirin to prevent heart disease.  Diabetes screening involves taking a blood sample to check your fasting blood sugar level. This should be  done once every 3 years, after age 37, if you are within normal weight and without risk factors for diabetes. Testing  should be considered at a younger age or be carried out more frequently if you are overweight and have at least 1 risk factor for diabetes.  Colorectal cancer can be detected and often prevented. Most routine colorectal cancer screening begins at the age of 25 and continues through age 77. However, your health care provider may recommend screening at an earlier age if you have risk factors for colon cancer. On a yearly basis, your health care provider may provide home test kits to check for hidden blood in the stool. Use of a small camera at the end of a tube to directly examine the colon (sigmoidoscopy or colonoscopy) can detect the earliest forms of colorectal cancer. Talk to your health care provider about this at age 15, when routine screening begins. Direct exam of the colon should be repeated every 5-10 years through age 56, unless early forms of precancerous polyps or small growths are found.   Talk with your health care provider about prostate cancer screening.  Testicular cancer screening isrecommended for adult males. Screening includes self-exam, a health care provider exam, and other screening tests. Consult with your health care provider about any symptoms you have or any concerns you have about testicular cancer.  Use sunscreen. Apply sunscreen liberally and repeatedly throughout the [place]. You should seek shade when your shadow is shorter than you. Protect yourself by wearing long sleeves, pants, a wide-brimmed hat, and sunglasses year round, whenever you are outdoors.  Once a month, do a whole-body skin exam, using a mirror to look at the skin on your back. Tell your health care provider about new moles, moles that have irregular borders, moles that are larger than a pencil eraser, or moles that have changed in shape or color.  Stay current with required vaccines (immunizations).  Influenza vaccine. All adults should be immunized every year.  Tetanus, diphtheria, and acellular pertussis  (Td, Tdap) vaccine. An adult who has not previously received Tdap or who does not know his vaccine status should receive 1 dose of Tdap. This initial dose should be followed by tetanus and diphtheria toxoids (Td) booster doses every 10 years. Adults with an unknown or incomplete history of completing a 3-dose immunization series with Td-containing vaccines should begin or complete a primary immunization series including a Tdap dose. Adults should receive a Td booster every 10 years.  Varicella vaccine. An adult without evidence of immunity to varicella should receive 2 doses or a second dose if he has previously received 1 dose.  Human papillomavirus (HPV) vaccine. Males aged 21-21 years who have not received the vaccine previously should receive the 3-dose series. Males aged 22-26 years may be immunized. Immunization is recommended through the age of 72 years for any male who has sex with males and did not get any or all doses earlier. Immunization is recommended for any person with an immunocompromised condition through the age of 35 years if he did not get any or all doses earlier. During the 3-dose series, the second dose should be obtained 4-8 weeks after the first dose. The third dose should be obtained 24 weeks after the first dose and 16 weeks after the second dose.  Zoster vaccine. One dose is recommended for adults aged 73 years or older unless certain conditions are present.    PREVNAR  - Pneumococcal 13-valent conjugate (PCV13) vaccine. When indicated, a person  who is uncertain of his immunization history and has no record of immunization should receive the PCV13 vaccine. An adult aged 3 years or older who has certain medical conditions and has not been previously immunized should receive 1 dose of PCV13 vaccine. This PCV13 should be followed with a dose of pneumococcal polysaccharide (PPSV23) vaccine. The PPSV23 vaccine dose should be obtained at least 8 weeks after the dose of PCV13 vaccine.  An adult aged 47 years or older who has certain medical conditions and previously received 1 or more doses of PPSV23 vaccine should receive 1 dose of PCV13. The PCV13 vaccine dose should be obtained 1 or more years after the last PPSV23 vaccine dose.    PNEUMOVAX - Pneumococcal polysaccharide (PPSV23) vaccine. When PCV13 is also indicated, PCV13 should be obtained first. All adults aged 46 years and older should be immunized. An adult younger than age 23 years who has certain medical conditions should be immunized. Any person who resides in a nursing home or long-term care facility should be immunized. An adult smoker should be immunized. People with an immunocompromised condition and certain other conditions should receive both PCV13 and PPSV23 vaccines. People with human immunodeficiency virus (HIV) infection should be immunized as soon as possible after diagnosis. Immunization during chemotherapy or radiation therapy should be avoided. Routine use of PPSV23 vaccine is not recommended for American Indians, [place] Natives, or people younger than 65 years unless there are medical conditions that require PPSV23 vaccine. When indicated, people who have unknown immunization and have no record of immunization should receive PPSV23 vaccine. One-time revaccination 5 years after the first dose of PPSV23 is recommended for people aged 19-64 years who have chronic kidney failure, nephrotic syndrome, asplenia, or immunocompromised conditions. People who received 1-2 doses of PPSV23 before age 51 years should receive another dose of PPSV23 vaccine at age 14 years or later if at least 5 years have passed since the previous dose. Doses of PPSV23 are not needed for people immunized with PPSV23 at or after age 48 years.    Hepatitis A vaccine. Adults who wish to be protected from this disease, have certain high-risk conditions, work with hepatitis A-infected animals, work in hepatitis A research labs, or travel to or work  in countries with a high rate of hepatitis A should be immunized. Adults who were previously unvaccinated and who anticipate close contact with an international adoptee during the first 60 days after arrival in the [place] from a country with a high rate of hepatitis A should be immunized.    Hepatitis B vaccine. Adults should be immunized if they wish to be protected from this disease, have certain high-risk conditions, may be exposed to blood or other infectious body fluids, are household contacts or sex partners of hepatitis B positive people, are clients or workers in certain care facilities, or travel to or work in countries with a high rate of hepatitis B.   Preventive Service / Frequency   Ages 81 to 83  Blood pressure check.  Lipid and cholesterol check  Lung cancer screening. / Every year if you are aged 20-80 years and have a 30-pack-year history of smoking and currently smoke or have quit within the past 15 years. Yearly screening is stopped once you have quit smoking for at least 15 years or develop a health problem that would prevent you from having lung cancer treatment.  Fecal occult blood test (FOBT) of stool. / Every year beginning at age 84 and continuing until  age 17. You may not have to do this test if you get a colonoscopy every 10 years.  Flexible sigmoidoscopy** or colonoscopy.** / Every 5 years for a flexible sigmoidoscopy or every 10 years for a colonoscopy beginning at age 85 and continuing until age 29. Screening for abdominal aortic aneurysm (AAA)  by ultrasound is recommended for people who have history of high blood pressure or who are current or former smokers.

## 2015-12-24 NOTE — Progress Notes (Signed)
Patient ID: Tony Ware, male   DOB: 06/13/1956, 60 y.o.   MRN: [id number]  Annual  Screening/Preventative Visit And Comprehensive Evaluation & Examination  This very nice 60 y.o. presents for presents for a Wellness/Preventative Visit & comprehensive evaluation and management of multiple medical co-morbidities.  Patient has been followed for HTN, Prediabetes, Hyperlipidemia and Vitamin D Deficiency. Patient has 40+ yr smoking of 1 +/- PPD and alleges quitting in August  2016.    HTN predates circa 1995. Patient's BP has been controlled at home.Today's BP: 122/66 mmHg. Patient denies any cardiac symptoms as chest pain, palpitations, shortness of breath, dizziness or ankle swelling.   Patient's hyperlipidemia is controlled with diet and medications. Patient denies myalgias or other medication SE's. Last lipids were at goal with Cholesterol 146; HDL 64; LDL Cholesterol 59; Triglycerides 117 on 09/04/2015.   Patient has prediabetes circa 07/2011 with A1c 6.0% and then 6.1% in 02/2014  and patient denies reactive hypoglycemic symptoms, visual blurring, diabetic polys or paresthesias. Last A1c was 6.0% on 09/04/2015.     Finally, patient has history of Vitamin D Deficiency of 52 on treatment in 2010 and last vitamin D was 50 on 09/04/2015.  Medication Sig  . acyclovir  400 MG  Take 1 tablet (400 mg total) by mouth 2 (two) times daily.  Marland Kitchen atorvastatin  80 MG  Take 1 tablet (80 mg total) by mouth daily.  Marland Kitchen buPROPion- XL 300 mg TAKE 1 TABLET BY MOUTH DAILY  . VITAMIN D  Take 2,000 Units by mouth daily.   . finasteride  5 MG  Take 5 mg by mouth daily.    . Tamsulosin 0.4 MG  Take 0.4 mg by mouth every evening.    Marland Kitchen CHANTIX CONTINUING Pak 1 mg Take 1/2 to 1 tablet 1 or 2 x daily for nicotine cravings   No Known Allergies   Past Medical History  Diagnosis Date  . Labile hypertension   . Osteoarthritis   . Prediabetes   . Other testicular hypofunction    Health Maintenance  Topic Date Due  .  Hepatitis C Screening  02/06/1956  . HIV Screening  08/02/1971  . TETANUS/TDAP  11/18/2014  . INFLUENZA VACCINE  06/19/2015  . COLONOSCOPY  06/18/2020   Immunization History  Administered Date(s) Administered  . Td 11/18/2004  . Tdap 12/25/2015   Past Surgical History  Procedure Laterality Date  . Hernia repair     Family History  Problem Relation Age of Onset  . Hypertension Mother   . Diabetes Mother   . Cancer Father     colon   Social History   Social History  . Marital Status: Single    Spouse Name: N/A  . Number of Children: N/A  . Years of Education: N/A   Occupational History  . Truckdriver   Social History Main Topics  . Smoking status: Former Smoker    Quit date: 06/26/2015  . Smokeless tobacco: Not on file  . Alcohol Use: 1.2 oz/week    2 Standard drinks or equivalent per week     Comment: beers  . Drug Use: No  . Sexual Activity: Not on file     ROS Constitutional: Denies fever, chills, weight loss/gain, headaches, insomnia,  night sweats or change in appetite. Does c/o fatigue. Eyes: Denies redness, blurred vision, diplopia, discharge, itchy or watery eyes.  ENT: Denies discharge, congestion, post nasal drip, epistaxis, sore throat, earache, hearing loss, dental pain, Tinnitus, Vertigo, Sinus pain or snoring.  Cardio: Denies chest pain, palpitations, irregular heartbeat, syncope, dyspnea, diaphoresis, orthopnea, PND, claudication or edema Respiratory: denies cough, dyspnea, DOE, pleurisy, hoarseness, laryngitis or wheezing.  Gastrointestinal: Denies dysphagia, heartburn, reflux, water brash, pain, cramps, nausea, vomiting, bloating, diarrhea, constipation, hematemesis, melena, hematochezia, jaundice or hemorrhoids Genitourinary: Denies dysuria, frequency, urgency, nocturia, hesitancy, discharge, hematuria or flank pain Musculoskeletal: Denies arthralgia, myalgia, stiffness, Jt. Swelling, pain, limp or strain/sprain. Denies Falls. Skin: Denies puritis,  rash, hives, warts, acne, eczema or change in skin lesion Neuro: No weakness, tremor, incoordination, spasms, paresthesia or pain Psychiatric: Denies confusion, memory loss or sensory loss. Denies Depression. Endocrine: Denies change in weight, skin, hair change, nocturia, and paresthesia, diabetic polys, visual blurring or hyper / hypo glycemic episodes.  Heme/Lymph: No excessive bleeding, bruising or enlarged lymph nodes.  Physical Exam  BP 122/66 mmHg  Pulse 83  Temp(Src) 97.3 F (36.3 C) (Temporal)  Resp 16  Ht 5\' 11"  (1.803 m)  Wt 185 lb 9.6 oz (84.188 kg)  BMI 25.90 kg/m2  SpO2 98%  General Appearance: Well nourished, in no apparent distress. Speech hoarse.  Eyes: PERRLA, EOMs, conjunctiva no swelling or erythema, normal fundi and vessels. Sinuses: No frontal/maxillary tenderness ENT/Mouth: EACs patent / TMs  nl. Nares clear without erythema, swelling, mucoid exudates. Oral hygiene is good. No erythema, swelling, or exudate. Tongue normal, non-obstructing. Tonsils not swollen or erythematous. Hearing normal.  Neck: Supple, thyroid normal. No bruits, nodes or JVD. Respiratory: Respiratory effort normal.  BS equal and clear bilateral without rales, rhonci, wheezing or stridor. Cardio: Heart sounds are normal with regular rate and rhythm and no murmurs, rubs or gallops. Peripheral pulses are normal and equal bilaterally without edema. No aortic or femoral bruits. Chest: symmetric with normal excursions and percussion.  Abdomen: Soft, with Nl bowel sounds. Nontender, no guarding, rebound, hernias, masses, or organomegaly.  Lymphatics: Non tender without lymphadenopathy.  Genitourinary: No hernias.Testes nl. DRE - prostate nl for age - smooth & firm w/o nodules. Musculoskeletal: Full ROM all peripheral extremities, joint stability, 5/5 strength, and normal gait. Skin: Warm and dry without rashes, lesions, cyanosis, clubbing or  ecchymosis.  Neuro: Cranial nerves intact, reflexes  equal bilaterally. Normal muscle tone, no cerebellar symptoms. Sensation intact.  Pysch: Alert and oriented X 3 with normal affect, insight and judgment appropriate.   Assessment and Plan  1. Annual Preventative/Screening Exam   - Microalbumin / creatinine urine ratio - EKG 12-Lead - [place], RETROPERITNL ABD,  LTD - POC Hemoccult Bld/Stl  - Urinalysis, Routine w reflex microscopic  - Vitamin B12 - Iron and TIBC - PSA - Testosterone - CBC with Differential/Platelet - BASIC METABOLIC PANEL WITH GFR - Hepatic function panel - Magnesium - Lipid panel - TSH - Hemoglobin A1c - Insulin, random - VITAMIN D 25 Hydroxy  - Tdap vaccine greater than or equal to 7yo IM - CT CHEST LUNG CA SCREEN LOW DOSE W/O CM; Future  2. Labile hypertension  - Microalbumin / creatinine urine ratio - EKG 12-Lead - [place], RETROPERITNL ABD,  LTD - TSH  3. Hyperlipidemia  - Lipid panel - TSH  4. Prediabetes  - Hemoglobin A1c - Insulin, random  5. Vitamin D deficiency  - VITAMIN D 25 Hydroxy   6. Testosterone deficiency  - Testosterone  7. SICKLE CELL TRAIT   8. Prostate cancer screening  - PSA  9. Screening for rectal cancer  - POC Hemoccult Bld/Stl   10. Other fatigue  - Vitamin B12 - Iron and TIBC - Testosterone - CBC  with Differential/Platelet - TSH  11. Medication management  - Urinalysis, Routine w reflex microscopic  - CBC with Differential/Platelet - BASIC METABOLIC PANEL WITH GFR - Hepatic function panel - Magnesium  12. Need for Tdap vaccination  - Tdap vaccine greater than or equal to 7yo IM  13. Heavy smoker, hx/o (more than 20 cigarettes per [place])  - CT CHEST LUNG CA SCREEN LOW DOSE W/O CM; Future  - patient is counseled in tobacco cessation and lung cancer screening goal.    Continue prudent diet as discussed, weight control, BP monitoring, regular exercise, and medications as discussed.  Discussed med effects and SE's. Routine screening labs and tests as  requested with regular follow-up as recommended. Over 40 minutes of exam, counseling, chart review and high complex critical decision making was performed

## 2015-12-25 ENCOUNTER — Encounter: Payer: Self-pay | Admitting: Internal Medicine

## 2015-12-25 ENCOUNTER — Ambulatory Visit (INDEPENDENT_AMBULATORY_CARE_PROVIDER_SITE_OTHER): Payer: Managed Care, Other (non HMO) | Admitting: Internal Medicine

## 2015-12-25 VITALS — BP 122/66 | HR 83 | Temp 97.3°F | Resp 16 | Ht 71.0 in | Wt 185.6 lb

## 2015-12-25 DIAGNOSIS — I10 Essential (primary) hypertension: Secondary | ICD-10-CM | POA: Diagnosis not present

## 2015-12-25 DIAGNOSIS — Z79899 Other long term (current) drug therapy: Secondary | ICD-10-CM | POA: Diagnosis not present

## 2015-12-25 DIAGNOSIS — Z0000 Encounter for general adult medical examination without abnormal findings: Secondary | ICD-10-CM

## 2015-12-25 DIAGNOSIS — R0989 Other specified symptoms and signs involving the circulatory and respiratory systems: Secondary | ICD-10-CM

## 2015-12-25 DIAGNOSIS — E349 Endocrine disorder, unspecified: Secondary | ICD-10-CM

## 2015-12-25 DIAGNOSIS — R5383 Other fatigue: Secondary | ICD-10-CM

## 2015-12-25 DIAGNOSIS — E559 Vitamin D deficiency, unspecified: Secondary | ICD-10-CM | POA: Diagnosis not present

## 2015-12-25 DIAGNOSIS — Z0001 Encounter for general adult medical examination with abnormal findings: Secondary | ICD-10-CM

## 2015-12-25 DIAGNOSIS — F17210 Nicotine dependence, cigarettes, uncomplicated: Secondary | ICD-10-CM

## 2015-12-25 DIAGNOSIS — Z125 Encounter for screening for malignant neoplasm of prostate: Secondary | ICD-10-CM

## 2015-12-25 DIAGNOSIS — Z1212 Encounter for screening for malignant neoplasm of rectum: Secondary | ICD-10-CM

## 2015-12-25 DIAGNOSIS — Z23 Encounter for immunization: Secondary | ICD-10-CM

## 2015-12-25 DIAGNOSIS — E782 Mixed hyperlipidemia: Secondary | ICD-10-CM

## 2015-12-25 DIAGNOSIS — D573 Sickle-cell trait: Secondary | ICD-10-CM

## 2015-12-25 DIAGNOSIS — R7303 Prediabetes: Secondary | ICD-10-CM

## 2015-12-25 LAB — BASIC METABOLIC PANEL WITH GFR
BUN: 11 mg/dL (ref 7–25)
CO2: 29 mmol/L (ref 20–31)
Calcium: 9.7 mg/dL (ref 8.6–10.3)
Chloride: 102 mmol/L (ref 98–110)
Creat: 0.93 mg/dL (ref 0.70–1.33)
GFR, Est African American: >89 mL/min (ref >=60)
GLUCOSE: 84 mg/dL (ref 65–99)
POTASSIUM: 4.6 mmol/L (ref 3.5–5.3)
Sodium: 139 mmol/L (ref 135–146)

## 2015-12-25 LAB — CBC WITH DIFFERENTIAL/PLATELET
Basophils Absolute: 0.1 10*3/uL (ref 0.0–0.1)
Basophils Relative: 2 % — ABNORMAL HIGH (ref 0–1)
Eosinophils Absolute: 0.4 10*3/uL (ref 0.0–0.7)
Eosinophils Relative: 7 % — ABNORMAL HIGH (ref 0–5)
HEMATOCRIT: 36.5 % — AB (ref 39.0–52.0)
HEMOGLOBIN: 11.9 g/dL — AB (ref 13.0–17.0)
LYMPHS ABS: 2.7 10*3/uL (ref 0.7–4.0)
LYMPHS PCT: 44 % (ref 12–46)
MCH: 28.9 pg (ref 26.0–34.0)
MCHC: 32.6 g/dL (ref 30.0–36.0)
MCV: 88.6 fL (ref 78.0–100.0)
MONO ABS: 0.5 10*3/uL (ref 0.1–1.0)
MPV: 9.1 fL (ref 8.6–12.4)
Monocytes Relative: 8 % (ref 3–12)
NEUTROS ABS: 2.4 10*3/uL (ref 1.7–7.7)
Neutrophils Relative %: 39 % — ABNORMAL LOW (ref 43–77)
Platelets: 344 10*3/uL (ref 150–400)
RBC: 4.12 MIL/uL — AB (ref 4.22–5.81)
RDW: 14.8 % (ref 11.5–15.5)
WBC: 6.2 10*3/uL (ref 4.0–10.5)

## 2015-12-25 LAB — VITAMIN B12: Vitamin B-12: 715 pg/mL (ref 200–1100)

## 2015-12-25 LAB — HEPATIC FUNCTION PANEL
ALK PHOS: 63 U/L (ref 40–115)
ALT: 18 U/L (ref 9–46)
AST: 18 U/L (ref 10–35)
Albumin: 3.9 g/dL (ref 3.6–5.1)
BILIRUBIN INDIRECT: 0.3 mg/dL (ref 0.2–1.2)
Bilirubin, Direct: 0.1 mg/dL (ref <=0.2)
TOTAL PROTEIN: 7.2 g/dL (ref 6.1–8.1)
Total Bilirubin: 0.4 mg/dL (ref 0.2–1.2)

## 2015-12-25 LAB — IRON AND TIBC
%SAT: 26 % (ref 15–60)
Iron: 87 ug/dL (ref 50–180)
TIBC: 333 ug/dL (ref 250–425)
UIBC: 246 ug/dL (ref 125–400)

## 2015-12-25 LAB — MAGNESIUM: Magnesium: 2.2 mg/dL (ref 1.5–2.5)

## 2015-12-25 LAB — LIPID PANEL
Cholesterol: 161 mg/dL (ref 125–200)
HDL: 64 mg/dL (ref >=40)
LDL CALC: 69 mg/dL (ref <130)
Total CHOL/HDL Ratio: 2.5 Ratio (ref <=5.0)
Triglycerides: 140 mg/dL (ref <150)
VLDL: 28 mg/dL (ref <30)

## 2015-12-25 LAB — HEMOGLOBIN A1C
HEMOGLOBIN A1C: 6.2 % — AB (ref <5.7)
MEAN PLASMA GLUCOSE: 131 mg/dL — AB (ref <117)

## 2015-12-25 LAB — TSH: TSH: 1.15 mIU/L (ref 0.40–4.50)

## 2015-12-26 LAB — VITAMIN D 25 HYDROXY (VIT D DEFICIENCY, FRACTURES): Vit D, 25-Hydroxy: 55 ng/mL (ref 30–100)

## 2015-12-26 LAB — MICROALBUMIN / CREATININE URINE RATIO
Creatinine, Urine: 39 mg/dL (ref 20–370)
Microalb, Ur: <0.2 mg/dL

## 2015-12-26 LAB — INSULIN, RANDOM: Insulin: 5.6 u[IU]/mL (ref 2.0–19.6)

## 2015-12-26 LAB — URINALYSIS, ROUTINE W REFLEX MICROSCOPIC
BILIRUBIN URINE: NEGATIVE
GLUCOSE, UA: NEGATIVE
Hgb urine dipstick: NEGATIVE
Ketones, ur: NEGATIVE
LEUKOCYTES UA: NEGATIVE
Nitrite: NEGATIVE
PH: 6 (ref 5.0–8.0)
PROTEIN: NEGATIVE
Specific Gravity, Urine: 1.007 (ref 1.001–1.035)

## 2015-12-26 LAB — PSA: PSA: 0.1 ng/mL (ref <=4.00)

## 2015-12-26 LAB — TESTOSTERONE: TESTOSTERONE: 343 ng/dL (ref 250–827)

## 2016-01-01 ENCOUNTER — Telehealth: Payer: Self-pay | Admitting: Internal Medicine

## 2016-01-01 NOTE — Telephone Encounter (Signed)
Patient went to [facility] for cxr, but there was no order in Epic. Advised patient that a CT Low Dose Chest screening for Lung Cancer was precert'd this morning at [facility], for 01-10-16.  Please advise patient if you also wanted him to have a CXR.  Thank you, Leonie Douglas Referral Coordinator  [facility] [phone] ext. [phone] Fax [phone]

## 2016-01-10 ENCOUNTER — Inpatient Hospital Stay: Admission: RE | Admit: 2016-01-10 | Payer: Managed Care, Other (non HMO) | Source: Ambulatory Visit

## 2016-05-01 ENCOUNTER — Other Ambulatory Visit: Payer: Self-pay | Admitting: Internal Medicine

## 2016-08-07 ENCOUNTER — Other Ambulatory Visit: Payer: Self-pay | Admitting: Internal Medicine

## 2016-10-22 ENCOUNTER — Other Ambulatory Visit: Payer: Self-pay

## 2016-10-22 DIAGNOSIS — Z1212 Encounter for screening for malignant neoplasm of rectum: Secondary | ICD-10-CM

## 2016-10-22 DIAGNOSIS — Z0001 Encounter for general adult medical examination with abnormal findings: Secondary | ICD-10-CM

## 2016-10-22 LAB — POC HEMOCCULT BLD/STL (HOME/3-CARD/SCREEN)
Card #2 Fecal Occult Blod, POC: NEGATIVE
Card #3 Fecal Occult Blood, POC: NEGATIVE
Fecal Occult Blood, POC: NEGATIVE

## 2016-11-06 ENCOUNTER — Other Ambulatory Visit: Payer: Self-pay | Admitting: Physician Assistant

## 2017-01-20 ENCOUNTER — Encounter: Payer: Self-pay | Admitting: Internal Medicine

## 2017-06-12 ENCOUNTER — Encounter: Payer: Self-pay | Admitting: Internal Medicine

## 2017-06-12 ENCOUNTER — Ambulatory Visit (INDEPENDENT_AMBULATORY_CARE_PROVIDER_SITE_OTHER): Payer: Managed Care, Other (non HMO) | Admitting: Internal Medicine

## 2017-06-12 VITALS — BP 104/66 | HR 104 | Temp 97.5°F | Resp 18 | Ht 71.0 in | Wt 176.4 lb

## 2017-06-12 DIAGNOSIS — Z79899 Other long term (current) drug therapy: Secondary | ICD-10-CM | POA: Diagnosis not present

## 2017-06-12 DIAGNOSIS — E559 Vitamin D deficiency, unspecified: Secondary | ICD-10-CM | POA: Diagnosis not present

## 2017-06-12 DIAGNOSIS — R0989 Other specified symptoms and signs involving the circulatory and respiratory systems: Secondary | ICD-10-CM

## 2017-06-12 DIAGNOSIS — Z122 Encounter for screening for malignant neoplasm of respiratory organs: Secondary | ICD-10-CM

## 2017-06-12 DIAGNOSIS — Z1211 Encounter for screening for malignant neoplasm of colon: Secondary | ICD-10-CM

## 2017-06-12 DIAGNOSIS — Z111 Encounter for screening for respiratory tuberculosis: Secondary | ICD-10-CM | POA: Diagnosis not present

## 2017-06-12 DIAGNOSIS — Z125 Encounter for screening for malignant neoplasm of prostate: Secondary | ICD-10-CM | POA: Diagnosis not present

## 2017-06-12 DIAGNOSIS — Z0001 Encounter for general adult medical examination with abnormal findings: Secondary | ICD-10-CM

## 2017-06-12 DIAGNOSIS — E782 Mixed hyperlipidemia: Secondary | ICD-10-CM

## 2017-06-12 DIAGNOSIS — R7303 Prediabetes: Secondary | ICD-10-CM

## 2017-06-12 DIAGNOSIS — Z1212 Encounter for screening for malignant neoplasm of rectum: Secondary | ICD-10-CM

## 2017-06-12 DIAGNOSIS — E349 Endocrine disorder, unspecified: Secondary | ICD-10-CM

## 2017-06-12 DIAGNOSIS — R5383 Other fatigue: Secondary | ICD-10-CM

## 2017-06-12 DIAGNOSIS — Z0000 Encounter for general adult medical examination without abnormal findings: Secondary | ICD-10-CM | POA: Diagnosis not present

## 2017-06-12 DIAGNOSIS — Z136 Encounter for screening for cardiovascular disorders: Secondary | ICD-10-CM | POA: Diagnosis not present

## 2017-06-12 DIAGNOSIS — I10 Essential (primary) hypertension: Secondary | ICD-10-CM | POA: Diagnosis not present

## 2017-06-12 LAB — IRON AND TIBC
%SAT: 18 % (ref 15–60)
IRON: 57 ug/dL (ref 50–180)
TIBC: 311 ug/dL (ref 250–425)
UIBC: 254 ug/dL

## 2017-06-12 LAB — BASIC METABOLIC PANEL WITH GFR
BUN: 11 mg/dL (ref 7–25)
CHLORIDE: 104 mmol/L (ref 98–110)
CO2: 24 mmol/L (ref 20–31)
Calcium: 9.2 mg/dL (ref 8.6–10.3)
Creat: 1.1 mg/dL (ref 0.70–1.25)
GFR, Est African American: 84 mL/min (ref >=60)
GFR, Est Non African American: 73 mL/min (ref >=60)
GLUCOSE: 87 mg/dL (ref 65–99)
POTASSIUM: 4.1 mmol/L (ref 3.5–5.3)
Sodium: 141 mmol/L (ref 135–146)

## 2017-06-12 LAB — LIPID PANEL
Cholesterol: 187 mg/dL (ref <200)
HDL: 48 mg/dL (ref >40)
LDL CALC: 109 mg/dL — AB (ref <100)
Total CHOL/HDL Ratio: 3.9 Ratio (ref <5.0)
Triglycerides: 151 mg/dL — ABNORMAL HIGH (ref <150)
VLDL: 30 mg/dL (ref <30)

## 2017-06-12 LAB — CBC WITH DIFFERENTIAL/PLATELET
BASOS ABS: 118 {cells}/uL (ref 0–200)
BASOS PCT: 2 %
EOS ABS: 177 {cells}/uL (ref 15–500)
Eosinophils Relative: 3 %
HEMATOCRIT: 35 % — AB (ref 38.5–50.0)
Hemoglobin: 11.7 g/dL — ABNORMAL LOW (ref 13.2–17.1)
Lymphocytes Relative: 33 %
Lymphs Abs: 1947 cells/uL (ref 850–3900)
MCH: 30.6 pg (ref 27.0–33.0)
MCHC: 33.4 g/dL (ref 32.0–36.0)
MCV: 91.6 fL (ref 80.0–100.0)
MONO ABS: 354 {cells}/uL (ref 200–950)
MPV: 8.9 fL (ref 7.5–12.5)
Monocytes Relative: 6 %
Neutro Abs: 3304 cells/uL (ref 1500–7800)
Neutrophils Relative %: 56 %
Platelets: 358 10*3/uL (ref 140–400)
RBC: 3.82 MIL/uL — ABNORMAL LOW (ref 4.20–5.80)
RDW: 14.2 % (ref 11.0–15.0)
WBC: 5.9 10*3/uL (ref 3.8–10.8)

## 2017-06-12 LAB — HEPATIC FUNCTION PANEL
ALBUMIN: 3.8 g/dL (ref 3.6–5.1)
ALK PHOS: 64 U/L (ref 40–115)
ALT: 13 U/L (ref 9–46)
AST: 17 U/L (ref 10–35)
Bilirubin, Direct: 0.1 mg/dL (ref <=0.2)
Indirect Bilirubin: 0.2 mg/dL (ref 0.2–1.2)
TOTAL PROTEIN: 6.9 g/dL (ref 6.1–8.1)
Total Bilirubin: 0.3 mg/dL (ref 0.2–1.2)

## 2017-06-12 LAB — TSH: TSH: 1.31 m[IU]/L (ref 0.40–4.50)

## 2017-06-12 NOTE — Progress Notes (Signed)
[facility]   Tony Ware, M.D.      Tony Ware, P.A.-C [facility]                [address] [place], [place] [address] Telephone [phone] Telefax [phone] Annual  Screening/Preventative Visit  & Comprehensive Evaluation & Examination     This very nice 61 y.o. MBM presents for a Screening/Preventative Visit & comprehensive evaluation and management of multiple medical co-morbidities.  Patient has been followed for HTN, T2_NIDDM  Prediabetes, Hyperlipidemia and Vitamin D Deficiency.Patient has 41+ yr smoking of 1 +/- PPD and alleges quitting in August  2016.    Due to his long history of smoking 1-1&1/2 PPD over 41 years,  I discussed lung cancer screening with him. He alleges quitting smoking in Aug 2016.  He was agreeable to undergo a screening low dose CT scan of the chest. I will refer him for a LDCT lung scan & lung cancer screening program     HTN predates since 1995. Patient's BP has been controlled at home.  Today's BP is at goal - 104/66. Patient denies any cardiac symptoms as chest pain, palpitations, shortness of breath, dizziness or ankle swelling.     Patient's hyperlipidemia is controlled with diet and medications. Patient denies myalgias or other medication SE's. Last lipids were at goal: Lab Results  Component Value Date   CHOL 161 12/25/2015   HDL 64 12/25/2015   LDLCALC 69 12/25/2015   TRIG 140 12/25/2015   CHOLHDL 2.5 12/25/2015      Patient has prediabetes   (A1c 6.0% in 2012 and 6.1% in 2015) and patient denies reactive hypoglycemic symptoms, visual blurring, diabetic polys or paresthesias. Last A1c was not at goal: Lab Results  Component Value Date   HGBA1C 6.2 (H) 12/25/2015       Finally, patient has history of Vitamin D Deficiency ("52" on treatment in 2010 and last vitamin D was not at goal (goal 70-100): Lab Results  Component Value Date   VD25OH 55  12/25/2015   Current Outpatient Prescriptions on File Prior to Visit  Medication Sig  . acyclovir  400 MG tablet TAKE 1 TAB 2 x  DAILY.  Marland Kitchen atorvastatin 80 MG tablet Take 1 tab daily.  Marland Kitchen VITAMIN D  2,000 Units Take   daily.   . finasteride  5 MG tablet Take  daily.    . sildenafil  20 MG tablet TAKE 1-5 TAB ONCE DAILY AS NEEDED/ DIRECTED  . Tamsulosin  0.4 MG CAPS Take  every evening.     No Known Allergies   Past Medical History:  Diagnosis Date  . Labile hypertension   . Osteoarthritis   . Other testicular hypofunction   . Prediabetes    Health Maintenance  Topic Date Due  . Hepatitis C Screening  07-28-1956  . HIV Screening  08/02/1971  . INFLUENZA VACCINE  06/18/2017  . COLONOSCOPY  06/18/2020  . TETANUS/TDAP  12/24/2025   Immunization History  Administered Date(s) Administered  . PPD Test 06/12/2017  . Td 11/18/2004  . Tdap 12/25/2015   Past Surgical History:  Procedure Laterality Date  . HERNIA REPAIR     Family History  Problem Relation Age of Onset  . Hypertension Mother   . Diabetes Mother   . Cancer Father        colon  Social History   Social History  . Marital status: Single    Spouse name: N/A  . Number of children: N/A  . Years of education: N/A   Occupational History  . Truck Geophysicist/field seismologist for [employer] History Main Topics  . Smoking status: Former Smoker    Quit date: 06/26/2015  . Smokeless tobacco: Not on file  . Alcohol use 1.2 oz/week    2 Standard drinks or equivalent per week     Comment: beers  . Drug use: No  . Sexual activity: Active     ROS Constitutional: Denies fever, chills, weight loss/gain, headaches, insomnia,  night sweats or change in appetite. Does c/o fatigue. Eyes: Denies redness, blurred vision, diplopia, discharge, itchy or watery eyes.  ENT: Denies discharge, congestion, post nasal drip, epistaxis, sore throat, earache, hearing loss, dental pain, Tinnitus, Vertigo, Sinus pain or snoring.  Cardio: Denies  chest pain, palpitations, irregular heartbeat, syncope, dyspnea, diaphoresis, orthopnea, PND, claudication or edema Respiratory: denies cough, dyspnea, DOE, pleurisy, hoarseness, laryngitis or wheezing.  Gastrointestinal: Denies dysphagia, heartburn, reflux, water brash, pain, cramps, nausea, vomiting, bloating, diarrhea, constipation, hematemesis, melena, hematochezia, jaundice or hemorrhoids Genitourinary: Denies dysuria, frequency, urgency, nocturia, hesitancy, discharge, hematuria or flank pain Musculoskeletal: Denies arthralgia, myalgia, stiffness, Jt. Swelling, pain, limp or strain/sprain. Denies Falls. Skin: Denies puritis, rash, hives, warts, acne, eczema or change in skin lesion Neuro: No weakness, tremor, incoordination, spasms, paresthesia or pain Psychiatric: Denies confusion, memory loss or sensory loss. Denies Depression. Endocrine: Denies change in weight, skin, hair change, nocturia, and paresthesia, diabetic polys, visual blurring or hyper / hypo glycemic episodes.  Heme/Lymph: No excessive bleeding, bruising or enlarged lymph nodes.  Physical Exam  BP 104/66   Pulse (!) 104   Temp (!) 97.5 F (36.4 C)   Resp 18   Ht 5\' 11"  (1.803 m)   Wt 176 lb 6.4 oz (80 kg)   BMI 24.60 kg/m   General Appearance: Well nourished and well groomed and in no apparent distress. Very raspy hoarse voice.  Eyes: PERRLA, EOMs, conjunctiva no swelling or erythema, normal fundi and vessels. Sinuses: No frontal/maxillary tenderness ENT/Mouth: EACs patent / TMs  nl. Nares clear without erythema, swelling, mucoid exudates. Oral hygiene is good. No erythema, swelling, or exudate. Tongue normal, non-obstructing. Tonsils not swollen or erythematous. Hearing normal.  Neck: Supple, thyroid normal. No bruits, nodes or JVD. Respiratory: Respiratory effort normal.  BS equal and clear bilateral without rales, rhonci, wheezing or stridor. Cardio: Heart sounds are normal with regular rate and rhythm and no  murmurs, rubs or gallops. Peripheral pulses are normal and equal bilaterally without edema. No aortic or femoral bruits. Chest: symmetric with normal excursions and percussion.  Abdomen: Soft, with Nl bowel sounds. Nontender, no guarding, rebound, hernias, masses, or organomegaly.  Lymphatics: Non tender without lymphadenopathy.  Genitourinary: No hernias.Testes nl. DRE - prostate nl for age - smooth & firm w/o nodules. Musculoskeletal: Full ROM all peripheral extremities, joint stability, 5/5 strength, and normal gait. Skin: Warm and dry without rashes, lesions, cyanosis, clubbing or  ecchymosis.  Neuro: Cranial nerves intact, reflexes equal bilaterally. Normal muscle tone, no cerebellar symptoms. Sensation intact.  Pysch: Alert and oriented X 3 with normal affect, insight and judgment appropriate.   Assessment and Plan  1. Annual Preventative/Screening Exam   2. Labile hypertension  - EKG 12-Lead - [place], RETROPERITNL ABD,  LTD - Urinalysis, Routine w reflex microscopic - Microalbumin / creatinine urine ratio - CBC with Differential/Platelet -  BASIC METABOLIC PANEL WITH GFR - Magnesium - TSH  3. Hyperlipidemia, mixed  - EKG 12-Lead - [place], RETROPERITNL ABD,  LTD - Hepatic function panel - Lipid panel - TSH  4. Prediabetes  - EKG 12-Lead - [place], RETROPERITNL ABD,  LTD - Hemoglobin A1c - Insulin, random  5. Vitamin D deficiency  - VITAMIN D 25 Hydroxy   6. Testosterone deficiency  - Testosterone  7. Screening for rectal cancer  - POC Hemoccult Bld/Stl   8. Prostate cancer screening  - PSA  9. Screening for ischemic heart disease  - EKG 12-Lead  10. Screening for AAA (aortic abdominal aneurysm)  - [place], RETROPERITNL ABD,  LTD  11. Fatigue  - Vitamin B12 - Iron and TIBC - Testosterone - CBC with Differential/Platelet - TSH  12. Medication management  - Urinalysis, Routine w reflex microscopic - Microalbumin / creatinine urine ratio - CBC with  Differential/Platelet - BASIC METABOLIC PANEL WITH GFR - Hepatic function panel - Magnesium - Lipid panel - TSH - Hemoglobin A1c - Insulin, random - VITAMIN D 25 Hydroxy  13. Screening examination for pulmonary tuberculosis  - PPD      Patient was counseled in prudent diet, weight control to achieve/maintain BMI less than 25, BP monitoring, regular exercise and medications as discussed.  Discussed med effects and SE's. Routine screening labs and tests as requested with regular follow-up as recommended. Over 40 minutes of exam, counseling, chart review and high complex critical decision making was performed

## 2017-06-12 NOTE — Patient Instructions (Signed)
Preventive Care for Adults  A healthy lifestyle and preventive care can promote health and wellness. Preventive health guidelines for men include the following key practices:  A routine yearly physical is a good way to check with your health care provider about your health and preventative screening. It is a chance to share any concerns and updates on your health and to receive a thorough exam.  Visit your dentist for a routine exam and preventative care every 6 months. Brush your teeth twice a [place] and floss once a [place]. Good oral hygiene prevents tooth decay and gum disease.  The frequency of eye exams is based on your age, health, family medical history, use of contact lenses, and other factors. Follow your health care provider's recommendations for frequency of eye exams.  Eat a healthy diet. Foods such as vegetables, fruits, whole grains, low-fat dairy products, and lean protein foods contain the nutrients you need without too many calories. Decrease your intake of foods high in solid fats, added sugars, and salt. Eat the right amount of calories for you.Get information about a proper diet from your health care provider, if necessary.  Regular physical exercise is one of the most important things you can do for your health. Most adults should get at least 150 minutes of moderate-intensity exercise (any activity that increases your heart rate and causes you to sweat) each week. In addition, most adults need muscle-strengthening exercises on 2 or more days a week.  Maintain a healthy weight. The body mass index (BMI) is a screening tool to identify possible weight problems. It provides an estimate of body fat based on height and weight. Your health care provider can find your BMI and can help you achieve or maintain a healthy weight.For adults 20 years and older:  A BMI below 18.5 is considered underweight.  A BMI of 18.5 to 24.9 is normal.  A BMI of 25 to 29.9 is considered overweight.  A  BMI of 30 and above is considered obese.  Maintain normal blood lipids and cholesterol levels by exercising and minimizing your intake of saturated fat. Eat a balanced diet with plenty of fruit and vegetables. Blood tests for lipids and cholesterol should begin at age 20 and be repeated every 5 years. If your lipid or cholesterol levels are high, you are over 50, or you are at high risk for heart disease, you may need your cholesterol levels checked more frequently.Ongoing high lipid and cholesterol levels should be treated with medicines if diet and exercise are not working.  If you smoke, find out from your health care provider how to quit. If you do not use tobacco, do not start.  Lung cancer screening is recommended for adults aged 55-80 years who are at high risk for developing lung cancer because of a history of smoking. A yearly low-dose CT scan of the lungs is recommended for people who have at least a 30-pack-year history of smoking and are a current smoker or have quit within the past 15 years. A pack year of smoking is smoking an average of 1 pack of cigarettes a [place] for 1 year (for example: 1 pack a [place] for 30 years or 2 packs a [place] for 15 years). Yearly screening should continue until the smoker has stopped smoking for at least 15 years. Yearly screening should be stopped for people who develop a health problem that would prevent them from having lung cancer treatment.  If you choose to drink alcohol, do not have more   than 2 drinks per [place]. One drink is considered to be 12 ounces (355 mL) of beer, 5 ounces (148 mL) of wine, or 1.5 ounces (44 mL) of liquor.  Avoid use of street drugs. Do not share needles with anyone. Ask for help if you need support or instructions about stopping the use of drugs.  High blood pressure causes heart disease and increases the risk of stroke. Your blood pressure should be checked at least every 1-2 years. Ongoing high blood pressure should be treated with  medicines, if weight loss and exercise are not effective.  If you are 45-79 years old, ask your health care provider if you should take aspirin to prevent heart disease.  Diabetes screening involves taking a blood sample to check your fasting blood sugar level. This should be done once every 3 years, after age 45, if you are within normal weight and without risk factors for diabetes. Testing should be considered at a younger age or be carried out more frequently if you are overweight and have at least 1 risk factor for diabetes.  Colorectal cancer can be detected and often prevented. Most routine colorectal cancer screening begins at the age of 50 and continues through age 75. However, your health care provider may recommend screening at an earlier age if you have risk factors for colon cancer. On a yearly basis, your health care provider may provide home test kits to check for hidden blood in the stool. Use of a small camera at the end of a tube to directly examine the colon (sigmoidoscopy or colonoscopy) can detect the earliest forms of colorectal cancer. Talk to your health care provider about this at age 50, when routine screening begins. Direct exam of the colon should be repeated every 5-10 years through age 75, unless early forms of precancerous polyps or small growths are found.   Talk with your health care provider about prostate cancer screening.  Testicular cancer screening isrecommended for adult males. Screening includes self-exam, a health care provider exam, and other screening tests. Consult with your health care provider about any symptoms you have or any concerns you have about testicular cancer.  Use sunscreen. Apply sunscreen liberally and repeatedly throughout the [place]. You should seek shade when your shadow is shorter than you. Protect yourself by wearing long sleeves, pants, a wide-brimmed hat, and sunglasses year round, whenever you are outdoors.  Once a month, do a whole-body  skin exam, using a mirror to look at the skin on your back. Tell your health care provider about new moles, moles that have irregular borders, moles that are larger than a pencil eraser, or moles that have changed in shape or color.  Stay current with required vaccines (immunizations).  Influenza vaccine. All adults should be immunized every year.  Tetanus, diphtheria, and acellular pertussis (Td, Tdap) vaccine. An adult who has not previously received Tdap or who does not know his vaccine status should receive 1 dose of Tdap. This initial dose should be followed by tetanus and diphtheria toxoids (Td) booster doses every 10 years. Adults with an unknown or incomplete history of completing a 3-dose immunization series with Td-containing vaccines should begin or complete a primary immunization series including a Tdap dose. Adults should receive a Td booster every 10 years.  Varicella vaccine. An adult without evidence of immunity to varicella should receive 2 doses or a second dose if he has previously received 1 dose.  Human papillomavirus (HPV) vaccine. Males aged 13-21 years who have not   received the vaccine previously should receive the 3-dose series. Males aged 22-26 years may be immunized. Immunization is recommended through the age of 26 years for any male who has sex with males and did not get any or all doses earlier. Immunization is recommended for any person with an immunocompromised condition through the age of 26 years if he did not get any or all doses earlier. During the 3-dose series, the second dose should be obtained 4-8 weeks after the first dose. The third dose should be obtained 24 weeks after the first dose and 16 weeks after the second dose.  Zoster vaccine. One dose is recommended for adults aged 60 years or older unless certain conditions are present.    PREVNAR  - Pneumococcal 13-valent conjugate (PCV13) vaccine. When indicated, a person who is uncertain of his immunization  history and has no record of immunization should receive the PCV13 vaccine. An adult aged 19 years or older who has certain medical conditions and has not been previously immunized should receive 1 dose of PCV13 vaccine. This PCV13 should be followed with a dose of pneumococcal polysaccharide (PPSV23) vaccine. The PPSV23 vaccine dose should be obtained at least 8 weeks after the dose of PCV13 vaccine. An adult aged 19 years or older who has certain medical conditions and previously received 1 or more doses of PPSV23 vaccine should receive 1 dose of PCV13. The PCV13 vaccine dose should be obtained 1 or more years after the last PPSV23 vaccine dose.    PNEUMOVAX - Pneumococcal polysaccharide (PPSV23) vaccine. When PCV13 is also indicated, PCV13 should be obtained first. All adults aged 65 years and older should be immunized. An adult younger than age 65 years who has certain medical conditions should be immunized. Any person who resides in a nursing home or long-term care facility should be immunized. An adult smoker should be immunized. People with an immunocompromised condition and certain other conditions should receive both PCV13 and PPSV23 vaccines. People with human immunodeficiency virus (HIV) infection should be immunized as soon as possible after diagnosis. Immunization during chemotherapy or radiation therapy should be avoided. Routine use of PPSV23 vaccine is not recommended for American Indians, [place] Natives, or people younger than 65 years unless there are medical conditions that require PPSV23 vaccine. When indicated, people who have unknown immunization and have no record of immunization should receive PPSV23 vaccine. One-time revaccination 5 years after the first dose of PPSV23 is recommended for people aged 19-64 years who have chronic kidney failure, nephrotic syndrome, asplenia, or immunocompromised conditions. People who received 1-2 doses of PPSV23 before age 65 years should receive another  dose of PPSV23 vaccine at age 65 years or later if at least 5 years have passed since the previous dose. Doses of PPSV23 are not needed for people immunized with PPSV23 at or after age 65 years.    Hepatitis A vaccine. Adults who wish to be protected from this disease, have certain high-risk conditions, work with hepatitis A-infected animals, work in hepatitis A research labs, or travel to or work in countries with a high rate of hepatitis A should be immunized. Adults who were previously unvaccinated and who anticipate close contact with an international adoptee during the first 60 days after arrival in the [place] from a country with a high rate of hepatitis A should be immunized.    Hepatitis B vaccine. Adults should be immunized if they wish to be protected from this disease, have certain high-risk conditions, may be exposed to   blood or other infectious body fluids, are household contacts or sex partners of hepatitis B positive people, are clients or workers in certain care facilities, or travel to or work in countries with a high rate of hepatitis B.   Preventive Service / Frequency   Ages 40 to 64  Blood pressure check.  Lipid and cholesterol check  Lung cancer screening. / Every year if you are aged 55-80 years and have a 30-pack-year history of smoking and currently smoke or have quit within the past 15 years. Yearly screening is stopped once you have quit smoking for at least 15 years or develop a health problem that would prevent you from having lung cancer treatment.  Fecal occult blood test (FOBT) of stool. / Every year beginning at age 50 and continuing until age 75. You may not have to do this test if you get a colonoscopy every 10 years.  Flexible sigmoidoscopy** or colonoscopy.** / Every 5 years for a flexible sigmoidoscopy or every 10 years for a colonoscopy beginning at age 50 and continuing until age 75. Screening for abdominal aortic aneurysm (AAA)  by ultrasound is  recommended for people who have history of high blood pressure or who are current or former smokers. +++++++++++ Recommend Adult Low Dose Aspirin or  coated  Aspirin 81 mg daily  To reduce risk of Colon Cancer 20 %,  Skin Cancer 26 % ,  Melanoma 46%  and  Pancreatic cancer 60% ++++++++++++++++++++ Vitamin D goal  is between 70-100.  Please make sure that you are taking your Vitamin D as directed.  It is very important as a natural anti-inflammatory  helping hair, skin, and nails, as well as reducing stroke and heart attack risk.  It helps your bones and helps with mood. It also decreases numerous cancer risks so please take it as directed.  Low Vit D is associated with a 200-300% higher risk for CANCER  and 200-300% higher risk for HEART   ATTACK  &  STROKE.   ...................................... It is also associated with higher death rate at younger ages,  autoimmune diseases like Rheumatoid arthritis, Lupus, Multiple Sclerosis.    Also many other serious conditions, like depression, Alzheimer's Dementia, infertility, muscle aches, fatigue, fibromyalgia - just to name a few. +++++++++++++++++++++ Recommend the book "The END of DIETING" by Dr Joel Fuhrman  & the book "The END of DIABETES " by Dr Joel Fuhrman At [employer] - get book & Audio CD's    Being diabetic has a  300% increased risk for heart attack, stroke, cancer, and alzheimer- type vascular dementia. It is very important that you work harder with diet by avoiding all foods that are white. Avoid white rice (brown & wild rice is OK), white potatoes (sweetpotatoes in moderation is OK), White bread or wheat bread or anything made out of white flour like bagels, donuts, rolls, buns, biscuits, cakes, pastries, cookies, pizza crust, and pasta (made from white flour & egg whites) - vegetarian pasta or spinach or wheat pasta is OK. Multigrain breads like Arnold's or Pepperidge Farm, or multigrain sandwich thins or flatbreads.  Diet,  exercise and weight loss can reverse and cure diabetes in the early stages.  Diet, exercise and weight loss is very important in the control and prevention of complications of diabetes which affects every system in your body, ie. Brain - dementia/stroke, eyes - glaucoma/blindness, heart - heart attack/heart failure, kidneys - dialysis, stomach - gastric paralysis, intestines - malabsorption, nerves - severe painful neuritis, circulation -   gangrene & loss of a leg(s), and finally cancer and Alzheimers.    I recommend avoid fried & greasy foods,  sweets/candy, white rice (brown or wild rice or Quinoa is OK), white potatoes (sweet potatoes are OK) - anything made from white flour - bagels, doughnuts, rolls, buns, biscuits,white and wheat breads, pizza crust and traditional pasta made of white flour & egg white(vegetarian pasta or spinach or wheat pasta is OK).  Multi-grain bread is OK - like multi-grain flat bread or sandwich thins. Avoid alcohol in excess. Exercise is also important.    Eat all the vegetables you want - avoid meat, especially red meat and dairy - especially cheese.  Cheese is the most concentrated form of trans-fats which is the worst thing to clog up our arteries. Veggie cheese is OK which can be found in the fresh produce section at [employer] or [employer] or [employer]  ++++++++++++++++++++++ DASH Eating Plan  DASH stands for "Dietary Approaches to Stop Hypertension."   The DASH eating plan is a healthy eating plan that has been shown to reduce high blood pressure (hypertension). Additional health benefits may include reducing the risk of type 2 diabetes mellitus, heart disease, and stroke. The DASH eating plan may also help with weight loss. WHAT DO I NEED TO KNOW ABOUT THE DASH EATING PLAN? For the DASH eating plan, you will follow these general guidelines:  Choose foods with a percent daily value for sodium of less than 5% (as listed on the food label).  Use salt-free  seasonings or herbs instead of table salt or sea salt.  Check with your health care provider or pharmacist before using salt substitutes.  Eat lower-sodium products, often labeled as "lower sodium" or "no salt added."  Eat fresh foods.  Eat more vegetables, fruits, and low-fat dairy products.  Choose whole grains. Look for the word "whole" as the first word in the ingredient list.  Choose fish   Limit sweets, desserts, sugars, and sugary drinks.  Choose heart-healthy fats.  Eat veggie cheese   Eat more home-cooked food and less restaurant, buffet, and fast food.  Limit fried foods.  Cook foods using methods other than frying.  Limit canned vegetables. If you do use them, rinse them well to decrease the sodium.  When eating at a restaurant, ask that your food be prepared with less salt, or no salt if possible.                      WHAT FOODS CAN I EAT? Read Dr Joel Fuhrman's books on The End of Dieting & The End of Diabetes  Grains Whole grain or whole wheat bread. Brown rice. Whole grain or whole wheat pasta. Quinoa, bulgur, and whole grain cereals. Low-sodium cereals. Corn or whole wheat flour tortillas. Whole grain cornbread. Whole grain crackers. Low-sodium crackers.  Vegetables Fresh or frozen vegetables (raw, steamed, roasted, or grilled). Low-sodium or reduced-sodium tomato and vegetable juices. Low-sodium or reduced-sodium tomato sauce and paste. Low-sodium or reduced-sodium canned vegetables.   Fruits All fresh, canned (in natural juice), or frozen fruits.  Protein Products  All fish and seafood.  Dried beans, peas, or lentils. Unsalted nuts and seeds. Unsalted canned beans.  Dairy Low-fat dairy products, such as skim or 1% milk, 2% or reduced-fat cheeses, low-fat ricotta or cottage cheese, or plain low-fat yogurt. Low-sodium or reduced-sodium cheeses.  Fats and Oils Tub margarines without trans fats. Light or reduced-fat mayonnaise and salad dressings  (reduced sodium). Avocado. Safflower,   olive, or canola oils. Natural peanut or almond butter.  Other Unsalted popcorn and pretzels. The items listed above may not be a complete list of recommended foods or beverages. Contact your dietitian for more options.  +++++++++++++++++++  WHAT FOODS ARE NOT RECOMMENDED? Grains/ White flour or wheat flour White bread. White pasta. White rice. Refined cornbread. Bagels and croissants. Crackers that contain trans fat.  Vegetables  Creamed or fried vegetables. Vegetables in a . Regular canned vegetables. Regular canned tomato sauce and paste. Regular tomato and vegetable juices.  Fruits Dried fruits. Canned fruit in light or heavy syrup. Fruit juice.  Meat and Other Protein Products Meat in general - RED meat & White meat.  Fatty cuts of meat. Ribs, chicken wings, all processed meats as bacon, sausage, bologna, salami, fatback, hot dogs, bratwurst and packaged luncheon meats.  Dairy Whole or 2% milk, cream, half-and-half, and cream cheese. Whole-fat or sweetened yogurt. Full-fat cheeses or blue cheese. Non-dairy creamers and whipped toppings. Processed cheese, cheese spreads, or cheese curds.  Condiments Onion and garlic salt, seasoned salt, table salt, and sea salt. Canned and packaged gravies. Worcestershire sauce. Tartar sauce. Barbecue sauce. Teriyaki sauce. Soy sauce, including reduced sodium. Steak sauce. Fish sauce. Oyster sauce. Cocktail sauce. Horseradish. Ketchup and mustard. Meat flavorings and tenderizers. Bouillon cubes. Hot sauce. Tabasco sauce. Marinades. Taco seasonings. Relishes.  Fats and Oils Butter, stick margarine, lard, shortening and bacon fat. Coconut, palm kernel, or palm oils. Regular salad dressings.  Pickles and olives. Salted popcorn and pretzels.  The items listed above may not be a complete list of foods and beverages to avoid.    

## 2017-06-13 LAB — VITAMIN B12: Vitamin B-12: 691 pg/mL (ref 200–1100)

## 2017-06-13 LAB — URINALYSIS, ROUTINE W REFLEX MICROSCOPIC
Bilirubin Urine: NEGATIVE
Glucose, UA: NEGATIVE
HGB URINE DIPSTICK: NEGATIVE
KETONES UR: NEGATIVE
Leukocytes, UA: NEGATIVE
NITRITE: NEGATIVE
Protein, ur: NEGATIVE
SPECIFIC GRAVITY, URINE: 1.012 (ref 1.001–1.035)
pH: 6 (ref 5.0–8.0)

## 2017-06-13 LAB — PSA: PSA: 0.2 ng/mL (ref <=4.0)

## 2017-06-13 LAB — HEMOGLOBIN A1C
Hgb A1c MFr Bld: 5.9 % — ABNORMAL HIGH (ref <5.7)
MEAN PLASMA GLUCOSE: 123 mg/dL

## 2017-06-13 LAB — MICROALBUMIN / CREATININE URINE RATIO
CREATININE, URINE: 92 mg/dL (ref 20–370)
MICROALB UR: 0.3 mg/dL
Microalb Creat Ratio: 3 mcg/mg creat (ref <30)

## 2017-06-13 LAB — VITAMIN D 25 HYDROXY (VIT D DEFICIENCY, FRACTURES): Vit D, 25-Hydroxy: 49 ng/mL (ref 30–100)

## 2017-06-13 LAB — INSULIN, RANDOM: INSULIN: 5 u[IU]/mL (ref 2.0–19.6)

## 2017-06-13 LAB — TESTOSTERONE: Testosterone: 240 ng/dL — ABNORMAL LOW (ref 250–827)

## 2017-06-13 LAB — MAGNESIUM: Magnesium: 2.2 mg/dL (ref 1.5–2.5)

## 2017-06-16 ENCOUNTER — Other Ambulatory Visit: Payer: Self-pay | Admitting: *Deleted

## 2017-06-16 ENCOUNTER — Encounter: Payer: Self-pay | Admitting: *Deleted

## 2017-06-16 LAB — TB SKIN TEST
INDURATION: 0 mm
TB Skin Test: NEGATIVE

## 2017-06-16 MED ORDER — ATORVASTATIN CALCIUM 80 MG PO TABS: 80.0000 mg | ORAL_TABLET | Freq: Every day | ORAL | 1 refills | Status: DC

## 2017-07-16 ENCOUNTER — Other Ambulatory Visit: Payer: Self-pay | Admitting: *Deleted

## 2017-07-16 DIAGNOSIS — Z1212 Encounter for screening for malignant neoplasm of rectum: Secondary | ICD-10-CM

## 2017-07-16 LAB — POC HEMOCCULT BLD/STL (HOME/3-CARD/SCREEN)
Card #2 Fecal Occult Blod, POC: NEGATIVE
Card #3 Fecal Occult Blood, POC: NEGATIVE
Fecal Occult Blood, POC: NEGATIVE

## 2017-08-14 ENCOUNTER — Other Ambulatory Visit: Payer: Self-pay | Admitting: Internal Medicine

## 2017-09-24 ENCOUNTER — Other Ambulatory Visit: Payer: Self-pay | Admitting: Internal Medicine

## 2017-10-13 ENCOUNTER — Ambulatory Visit: Payer: Self-pay | Admitting: Physician Assistant

## 2017-11-25 ENCOUNTER — Encounter: Payer: Self-pay | Admitting: Physician Assistant

## 2017-11-25 ENCOUNTER — Ambulatory Visit: Payer: Managed Care, Other (non HMO) | Admitting: Physician Assistant

## 2017-11-25 VITALS — BP 116/78 | HR 83 | Temp 97.6°F | Resp 16 | Ht 71.0 in | Wt 185.6 lb

## 2017-11-25 DIAGNOSIS — R0989 Other specified symptoms and signs involving the circulatory and respiratory systems: Secondary | ICD-10-CM | POA: Diagnosis not present

## 2017-11-25 DIAGNOSIS — R7303 Prediabetes: Secondary | ICD-10-CM | POA: Diagnosis not present

## 2017-11-25 DIAGNOSIS — E782 Mixed hyperlipidemia: Secondary | ICD-10-CM | POA: Diagnosis not present

## 2017-11-25 DIAGNOSIS — M67922 Unspecified disorder of synovium and tendon, left upper arm: Secondary | ICD-10-CM

## 2017-11-25 MED ORDER — DEXAMETHASONE SODIUM PHOSPHATE 100 MG/10ML IJ SOLN
10.0000 mg | Freq: Once | INTRAMUSCULAR | Status: AC
  Administered 2017-11-25: 10 mg via INTRAMUSCULAR

## 2017-11-25 NOTE — Patient Instructions (Signed)
Biceps Tendon Tendinitis (Proximal) and Tenosynovitis The proximal biceps tendon is a strong cord of tissue that connects the biceps muscle, on the front of the upper arm, to the shoulder blade. Tendinitis is inflammation of a tendon. Tenosynovitis is inflammation of the lining around the tendon (tendon sheath). These conditions often occur at the same time, and they can interfere with the ability to bend the elbow and turn the hand palm-up (supination). Proximal biceps tendon tendinitis and tenosynovitis are usually caused by overusing the shoulder joint and the biceps muscle. These conditions usually heal within 6 weeks. Proximal biceps tendon tendinitis may include a grade 1 or grade 2 strain of the tendon. A grade 1 strain is mild, and it involves a slight pull of the tendon without any stretching or noticeable tearing of the tendon. There is usually no loss of biceps muscle strength. A grade 2 strain is moderate, and it involves a small tear in the tendon. The tendon is stretched, and biceps strength is usually decreased. What are the causes? This condition may be caused by:  A sudden increase in frequency or intensity of activity that involves the shoulder and the biceps muscle.  Overuse of the biceps muscle. This can happen when you do the same movements over and over, such as: ? Supination. ? Forceful straightening (hyperextension) of the elbow. ? Bending the elbow.  A direct, forceful hit or injury (trauma) to the elbow. This is rare.  What increases the risk? The following factors may make you more likely to develop this condition:  Playing contact sports.  Playing sports that involve throwing and overhead movements, including racket sports, gymnastics, weight lifting, or bodybuilding.  Doing physical labor.  Having poor strength and flexibility of the arm and shoulder.  What are the signs or symptoms? Symptoms of this condition may include:  Pain and inflammation in the front  of the shoulder. Pain may get worse with movement, especially when you use resistance, as in weight lifting.  A feeling of warmth in the front of the shoulder.  Limited range of motion of the shoulder and the elbow.  A crackling sound (crepitation) when you move or touch the shoulder or the upper arm.  In some cases, symptoms may return (recur) after treatment, and they may be long-lasting (chronic). How is this diagnosed? This condition is diagnosed based on your symptoms, your medical history, and a physical exam. You may have tests, including X-rays or MRIs. Your health care provider may test your range of motion by asking you to do arm movements. How is this treated? This condition is treated by resting and icing the injured area, and by doing physical therapy exercises. Depending on the severity of your condition, treatment may also include:  Medicines to help relieve pain and inflammation.  Ultrasound therapy. This is the application of sound waves to the injured area.  Injecting medicines (corticosteroids) into your tendon sheath.  Injecting medicines that numb the area (local anesthetics).  Surgery to remove the damaged part of the tendon and reattach the undamaged part of the tendon to the arm bone (humerus). This is usually only done if you have symptoms that do not get better with other treatment methods.  Follow these instructions at home: Managing pain, stiffness, and swelling  If directed, put ice on the injured area: ? Put ice in a plastic bag. ? Place a towel between your skin and the bag. ? Leave the ice on for 20 minutes, 2-3 times a Calise.    Move your fingers often to avoid stiffness and to lessen swelling.  Raise (elevate) the injured area above the level of your heart while you are sitting or lying down.  If directed, apply heat to the affected area before you exercise. Use the heat source that your health care provider recommends, such as a moist heat pack or a  heating pad. ? Place a towel between your skin and the heat source. ? Leave the heat on for 20-30 minutes. ? Remove the heat if your skin turns bright red. This is especially important if you are unable to feel pain, heat, or cold. You may have a greater risk of getting burned. Activity  Return to your normal activities as told by your health care provider. Ask your health care provider what activities are safe for you.  Do not lift anything that is heavier than 10 lb (4.5 kg) until your health care provider tells you that it is safe.  Avoid activities that cause pain or make your condition worse.  Do exercises as told by your health care provider. General instructions  Take over-the-counter and prescription medicines only as told by your health care provider.  Do not drive or operate heavy machinery while taking prescription pain medicines.  Keep all follow-up visits as told by your health care provider. This is important. How is this prevented?  Warm up and stretch before being active.  Cool down and stretch after being active.  Give your body time to rest between periods of activity.  Make sure any equipment that you use is fitted to you.  Be safe and responsible while being active to avoid falls.  Do at least 150 minutes of moderate-intensity aerobic exercise each week, such as brisk walking or water aerobics.  Maintain physical fitness, including: ? Strength. ? Flexibility. ? Cardiovascular fitness. ? Endurance. Contact a health care provider if:  You have symptoms that get worse or do not get better after 2 weeks of treatment.  You develop new symptoms. Get help right away if:  You develop severe pain. This information is not intended to replace advice given to you by your health care provider. Make sure you discuss any questions you have with your health care provider. Document Released: 11/04/2005 Document Revised: 07/11/2016 Document Reviewed:  10/13/2015 Elsevier Interactive Patient Education  2018 [employer]   Biceps Tendon Tendinitis (Proximal) and Tenosynovitis Rehab Ask your health care provider which exercises are safe for you. Do exercises exactly as told by your health care provider and adjust them as directed. It is normal to feel mild stretching, pulling, tightness, or discomfort as you do these exercises, but you should stop right away if you feel sudden pain or your pain gets worse.Do not begin these exercises until told by your health care provider. Stretching and range of motion exercises These exercises warm up your muscles and joints and improve the movement and flexibility of your arm and shoulder. These exercises also help to relieve pain and stiffness. Exercise A: Shoulder flexion  1. Stand facing a wall. Put your left / right hand on the wall. 2. Slide your left / right hand up the wall. Stop when you feel a stretch in your shoulder, or when you reach the angle that is recommended by your health care provider. ? Use your other hand to help raise your arm, if needed. ? As your hand gets higher, you may need to step closer to the wall. ? Avoid shrugging your shoulder while you raise   your arm. To do this, keep your shoulder blade tucked down toward your spine. 3. Hold for __________ seconds. 4. Slowly return to the starting position. Use your other arm to help, if needed. Repeat __________ times. Complete this exercise __________ times a Qadri. Exercise B: Posterior capsule stretch ( passive horizontal adduction) 1. Sit or stand and pull your left / right elbow across your chest, toward your other shoulder. Stop when you feel a gentle stretch in the back of your shoulder and upper arm. ? Keep your arm at shoulder height. ? Keep your arm as close to your body as you comfortably can. 2. Hold for __________ seconds. 3. Slowly return to the starting position. Repeat __________ times. Complete this exercise  __________ times a Druck. Strengthening exercises These exercises build strength and endurance in your arm and shoulder. Endurance is the ability to use your muscles for a long time, even after your muscles get tired. Exercise C: Elbow flexion, supinated  1. Sit on a stable chair without armrests, or stand. 2. If directed, hold a __________ weight in your left / right hand, or hold an exercise band with both hands. Your palms should face up toward the ceiling at the starting position. 3. Bend your left / right elbow and move your hand up toward your shoulder. Keep your other arm straight down, in the starting position. 4. Slowly return to the starting position. Repeat __________ times. Complete this exercise __________ times a Gonsoulin. Exercise D: Scapular protraction, supine  1. Lie on your back on a firm surface. If directed, hold a __________ weight in your left / right hand. 2. Raise your left / right arm straight into the air so your hand is directly above your shoulder joint. 3. Push the weight into the air so your shoulder lifts off of the surface that you are lying on. Do not move your head, neck, or back. 4. Hold for __________ seconds. 5. Slowly return to the starting position. Let your muscles relax completely before you repeat this exercise. Repeat __________ times. Complete this exercise __________ times a Frisby. Exercise E: Scapular retraction  1. Sit in a stable chair without armrests, or stand. 2. Secure an exercise band to a stable object in front of you so the band is at shoulder height. 3. Hold one end of the exercise band in each hand. 4. Squeeze your shoulder blades together and move your elbows slightly behind you. Do not shrug your shoulders. 5. Hold for __________ seconds. 6. Slowly return to the starting position. Repeat __________ times. Complete this exercise __________ times a Chaney. This information is not intended to replace advice given to you by your health care  provider. Make sure you discuss any questions you have with your health care provider. Document Released: 11/04/2005 Document Revised: 07/11/2016 Document Reviewed: 10/13/2015 Elsevier Interactive Patient Education  2018 [employer] Inc.   

## 2017-11-25 NOTE — Progress Notes (Signed)
Assessment and Plan:   Hypertension -Continue medication, monitor blood pressure at home. Continue DASH diet.  Reminder to go to the ER if any CP, SOB, nausea, dizziness, severe HA, changes vision/speech, left arm numbness and tingling and jaw pain.   Cholesterol -Continue diet and exercise. Check cholesterol.    Prediabetes  -Continue diet and exercise. Check A1C  Tendinopathy of left biceps tendon -     dexamethasone (DECADRON) injection 10 mg Verbal consent obtained, risk and benefits were addressed with patient including but not limited to tendon rupture, infection, bleeding, etc. A trigger point injection was performed at the site of maximal tenderness using 1% plain Lidocaine and Dexamethasone. This was well tolerated, and followed by immediate relief of pain. Return precautions discussed with the patient.   Continue diet and meds as discussed. Further disposition pending results of labs. Over 30 minutes of exam, counseling, chart review, and critical decision making was performed Future Appointments  Date Time Provider [place]  01/19/2018  9:30 AM Unk Pinto, MD [facility] None  07/08/2018  2:00 PM Unk Pinto, MD [facility] None    HPI 62 y.o. AA male  presents for 3 month follow up on hypertension, cholesterol, prediabetes, and vitamin D deficiency.   He works outside   His blood pressure has been controlled at home, today their BP is BP: 116/78  He does not workout. He denies chest pain, shortness of breath, dizziness.  He is on cholesterol medication and denies myalgias. His cholesterol is at goal. The cholesterol last visit was:   Lab Results  Component Value Date   CHOL 187 06/12/2017   HDL 48 06/12/2017   LDLCALC 109 (H) 06/12/2017   TRIG 151 (H) 06/12/2017   CHOLHDL 3.9 06/12/2017    He has been working on diet and exercise for prediabetes, and denies paresthesia of the feet, polydipsia, polyuria and visual disturbances. Last A1C in the office  was:  Lab Results  Component Value Date   HGBA1C 5.9 (H) 06/12/2017   Patient is on Vitamin D supplement.   Lab Results  Component Value Date   VD25OH 54 06/12/2017     He does smoke, last visit was given chantix for smoking cessation last visit, he has not had a cig since August 8th 2017    Current Medications:  Current Outpatient Medications on File Prior to Visit  Medication Sig Dispense Refill  . acyclovir (ZOVIRAX) 400 MG tablet TAKE ONE TABLET BY MOUTH TWICE A Kirkey 60 tablet 3  . atorvastatin (LIPITOR) 80 MG tablet Take 1 tablet (80 mg total) by mouth daily. 90 tablet 1  . Cholecalciferol (VITAMIN D PO) Take 2,000 Units by mouth daily.     . finasteride (PROSCAR) 5 MG tablet Take 5 mg by mouth daily.      . sildenafil (REVATIO) 20 MG tablet TAKE 1 TO 5 TABLETS BY MOUTH DAILY AS NEEDED 90 tablet 2  . Tamsulosin HCl (FLOMAX) 0.4 MG CAPS Take 0.4 mg by mouth every evening.       No current facility-administered medications on file prior to visit.    Medical History:  Past Medical History:  Diagnosis Date  . Labile hypertension   . Osteoarthritis   . Other testicular hypofunction   . Prediabetes    Allergies: No Known Allergies   Review of Systems:  Review of Systems  Constitutional: Negative.  Negative for malaise/fatigue and weight loss.  HENT: Negative for congestion, ear discharge, ear pain, hearing loss, nosebleeds, sore throat and  tinnitus.   Eyes: Negative.   Respiratory: Negative.  Negative for stridor.   Cardiovascular: Negative.   Gastrointestinal: Negative.   Genitourinary: Negative.   Musculoskeletal: Positive for joint pain (left shoulder with work).  Skin: Negative.   Neurological: Negative.  Negative for weakness and headaches.  Endo/Heme/Allergies: Negative.   Psychiatric/Behavioral: Negative.     Family history- Review and unchanged Social history- Review and unchanged Physical Exam: BP 116/78   Pulse 83   Temp 97.6 F (36.4 C)   Resp 16    Ht 5\' 11"  (1.803 m)   Wt 185 lb 9.6 oz (84.2 kg)   SpO2 97%   BMI 25.89 kg/m  Wt Readings from Last 3 Encounters:  11/25/17 185 lb 9.6 oz (84.2 kg)  06/12/17 176 lb 6.4 oz (80 kg)  12/25/15 185 lb 9.6 oz (84.2 kg)   General Appearance: Well nourished, in no apparent distress. Eyes: PERRLA, EOMs, conjunctiva no swelling or erythema Sinuses: No Frontal/maxillary tenderness ENT/Mouth: Ext aud canals clear, TMs without erythema, bulging. No erythema, swelling, or exudate on post pharynx.  Tonsils not swollen or erythematous. Hearing normal.  Neck: Supple, thyroid normal.  Respiratory: Respiratory effort normal, BS equal bilaterally without rales, rhonchi, wheezing or stridor.  Cardio: RRR with no MRGs. Brisk peripheral pulses without edema.  Abdomen: Soft, + BS,  Non tender, no guarding, rebound, hernias, masses. Lymphatics: Non tender without lymphadenopathy.  Musculoskeletal: Full ROM, 5/5 strength, Left sensory exam normal, motor exam normal, radial pulse intact and tenderness at proximal biceps tendon with pain with abduction to 90 degrees, full ROM 90 to 180.   Strength is normal and symmetric in arms. Skin: Warm, dry without rashes, lesions, ecchymosis.  Neuro: Cranial nerves intact. Normal muscle tone, no cerebellar symptoms. Psych: Awake and oriented X 3, normal affect, Insight and Judgment appropriate.    Vicie Mutters, PA-C 3:45 PM [facility]

## 2017-11-26 LAB — CBC WITH DIFFERENTIAL/PLATELET
BASOS PCT: 1.5 %
Basophils Absolute: 92 cells/uL (ref 0–200)
EOS ABS: 366 {cells}/uL (ref 15–500)
Eosinophils Relative: 6 %
HCT: 35.6 % — ABNORMAL LOW (ref 38.5–50.0)
HEMOGLOBIN: 12.2 g/dL — AB (ref 13.2–17.1)
Lymphs Abs: 2782 cells/uL (ref 850–3900)
MCH: 30.6 pg (ref 27.0–33.0)
MCHC: 34.3 g/dL (ref 32.0–36.0)
MCV: 89.2 fL (ref 80.0–100.0)
MPV: 10.1 fL (ref 7.5–12.5)
Monocytes Relative: 8.8 %
NEUTROS ABS: 2324 {cells}/uL (ref 1500–7800)
Neutrophils Relative %: 38.1 %
Platelets: 323 10*3/uL (ref 140–400)
RBC: 3.99 10*6/uL — ABNORMAL LOW (ref 4.20–5.80)
RDW: 13.3 % (ref 11.0–15.0)
Total Lymphocyte: 45.6 %
WBC: 6.1 10*3/uL (ref 3.8–10.8)
WBCMIX: 537 {cells}/uL (ref 200–950)

## 2017-11-26 LAB — HEPATIC FUNCTION PANEL
AG Ratio: 1.2 (calc) (ref 1.0–2.5)
ALT: 16 U/L (ref 9–46)
AST: 21 U/L (ref 10–35)
Albumin: 4.1 g/dL (ref 3.6–5.1)
Alkaline phosphatase (APISO): 68 U/L (ref 40–115)
BILIRUBIN DIRECT: 0.1 mg/dL (ref 0.0–0.2)
Globulin: 3.3 g/dL (calc) (ref 1.9–3.7)
Indirect Bilirubin: 0.2 mg/dL (calc) (ref 0.2–1.2)
TOTAL PROTEIN: 7.4 g/dL (ref 6.1–8.1)
Total Bilirubin: 0.3 mg/dL (ref 0.2–1.2)

## 2017-11-26 LAB — LIPID PANEL
CHOL/HDL RATIO: 3 (calc) (ref <5.0)
Cholesterol: 163 mg/dL (ref <200)
HDL: 54 mg/dL (ref >40)
LDL Cholesterol (Calc): 78 mg/dL (calc)
NON-HDL CHOLESTEROL (CALC): 109 mg/dL (ref <130)
Triglycerides: 216 mg/dL — ABNORMAL HIGH (ref <150)

## 2017-11-26 LAB — BASIC METABOLIC PANEL WITH GFR
BUN: 11 mg/dL (ref 7–25)
CHLORIDE: 102 mmol/L (ref 98–110)
CO2: 28 mmol/L (ref 20–32)
CREATININE: 1.14 mg/dL (ref 0.70–1.25)
Calcium: 9.6 mg/dL (ref 8.6–10.3)
GFR, Est African American: 80 mL/min/{1.73_m2} (ref >=60)
GFR, Est Non African American: 69 mL/min/{1.73_m2} (ref >=60)
Glucose, Bld: 77 mg/dL (ref 65–99)
POTASSIUM: 5.2 mmol/L (ref 3.5–5.3)
Sodium: 138 mmol/L (ref 135–146)

## 2017-11-26 LAB — TSH: TSH: 1.75 m[IU]/L (ref 0.40–4.50)

## 2017-11-26 LAB — HEMOGLOBIN A1C
HEMOGLOBIN A1C: 6 %{Hb} — AB (ref <5.7)
Mean Plasma Glucose: 126 (calc)
eAG (mmol/L): 7 (calc)

## 2017-12-04 ENCOUNTER — Ambulatory Visit: Payer: Managed Care, Other (non HMO) | Admitting: Internal Medicine

## 2017-12-04 VITALS — BP 116/82 | HR 72 | Temp 97.5°F | Resp 16 | Ht 71.0 in | Wt 184.2 lb

## 2017-12-04 DIAGNOSIS — K648 Other hemorrhoids: Secondary | ICD-10-CM | POA: Diagnosis not present

## 2017-12-04 DIAGNOSIS — K644 Residual hemorrhoidal skin tags: Secondary | ICD-10-CM | POA: Diagnosis not present

## 2017-12-04 MED ORDER — PREDNISONE 20 MG PO TABS: ORAL_TABLET | ORAL | 0 refills | Status: DC

## 2017-12-04 MED ORDER — HYDROCORTISONE ACETATE 25 MG RE SUPP: RECTAL | 1 refills | Status: DC

## 2017-12-04 MED ORDER — HYDROCORTISONE 2.5 % RE CREA: TOPICAL_CREAM | RECTAL | 3 refills | Status: AC

## 2017-12-07 ENCOUNTER — Encounter: Payer: Self-pay | Admitting: Internal Medicine

## 2017-12-07 NOTE — Progress Notes (Signed)
  Subjective:    Patient ID: Tony Ware, male    DOB: August 07, 1956, 62 y.o.   MRN: [id number]  HPI  This very nice 62 yo MBM presents w/ complaints of paimnful externakl hemorrhoids and has noticed some blood spotting on the toilet tissue but not in the toilet bowel. Has tried Prep-H w/o benefit. Patient's last Colonoscopy was 2011 per Dr Deatra Ina  recommending a 10 year f/u.  Medication Sig  . acyclovir (ZOVIRAX) 400 MG tablet TAKE ONE TABLET BY MOUTH TWICE A Anstine  . atorvastatin (LIPITOR) 80 MG tablet Take 1 tablet (80 mg total) by mouth daily.  . Cholecalciferol (VITAMIN D PO) Take 2,000 Units by mouth daily.   . finasteride (PROSCAR) 5 MG tablet Take 5 mg by mouth daily.    . sildenafil (REVATIO) 20 MG tablet TAKE 1 TO 5 TABLETS BY MOUTH DAILY AS NEEDED  . Tamsulosin HCl (FLOMAX) 0.4 MG CAPS Take 0.4 mg by mouth every evening.     Past Medical History:  Diagnosis Date  . Labile hypertension   . Osteoarthritis   . Other testicular hypofunction   . Prediabetes    Past Surgical History:  Procedure Laterality Date  . HERNIA REPAIR     No Known Allergies  Review of Systems   10 point systems review negative except as above    Objective:   Physical Exam  BP 116/82   Pulse 72   Temp (!) 97.5 F (36.4 C)   Resp 16   Ht 5\' 11"  (1.803 m)   Wt 184 lb 3.2 oz (83.6 kg)   BMI 25.69 kg/m   HEENT - WNL. Neck - supple.  Chest - Clear equal BS. Cor - Nl HS. RRR w/o sig m. PP 1(+). No edema. GU - focused exam finds several external inflamed & tender hemorrhoids. DRE is tender and some small, but no large Int hemorrhoids are evident.  MS- FROM w/o deformities.  Gait Nl. Neuro -  Nl w/o focal abnormalities.    Assessment & Plan:   1. Internal hemorrhoid   2. External hemorrhoid  - predniSONE (DELTASONE) 20 MG tablet; 1 tab 3 x Capo for 3 days, then 1 tab 2 x Christenson for 3 days, then 1 tab 1 x Gosling for 5 days  Dispense: 20 tablet; Refill: 0  - ANUSOL-HC 2.5 % rectal crm; Apply 3 to 3 x  /Piccirilli to rectal irritation  Disp: 30 g; Rf: 3  - ANUSOL-HC 25 MG supp; Insert 1 supp rectally 3 to 4 x / [place]well  Disp: 30 suppository; Rf: 1  - Discussed stool softeners, high fiber diet and sitz baths.

## 2017-12-07 NOTE — Patient Instructions (Signed)
About Hemorrhoids  Hemorrhoids are swollen veins in the lower rectum and anus.  Also called piles, hemorrhoids are a common problem.  Hemorrhoids may be internal (inside the rectum) or external (around the anus).  Internal Hemorrhoids  Internal hemorrhoids are often painless, but they rarely cause bleeding.  The internal veins may stretch and fall down (prolapse) through the anus to the outside of the body.  The veins may then become irritated and painful.  External Hemorrhoids  External hemorrhoids can be easily seen or felt around the anal opening.  They are under the skin around the anus.  When the swollen veins are scratched or broken by straining, rubbing or wiping they sometimes bleed.  How Hemorrhoids Occur  Veins in the rectum and around the anus tend to swell under pressure.  Hemorrhoids can result from increased pressure in the veins of your anus or rectum.  Some sources of pressure are:   Straining to have a bowel movement because of constipation  Waiting too long to have a bowel movement  Coughing and sneezing often  Sitting for extended periods of time, including on the toilet  Diarrhea  Obesity  Trauma or injury to the anus  Some liver diseases  Stress  Family history of hemorrhoids  Pregnancy  Pregnant women should try to avoid becoming constipated, because they are more likely to have hemorrhoids during pregnancy.  In the last trimester of pregnancy, the enlarged uterus may press on blood vessels and causes hemorrhoids.  In addition, the strain of childbirth sometimes causes hemorrhoids after the birth.  Symptoms of Hemorrhoids  Some symptoms of hemorrhoids include:  Swelling and/or a tender lump around the anus  Itching, mild burning and bleeding around the anus  Painful bowel movements with or without constipation  Bright red blood covering the stool, on toilet paper or in the toilet bowel.   Symptoms usually go away within a few days.  Always  talk to your doctor about any bleeding to make sure it is not from some other causes.  Diagnosing and Treating Hemorrhoids  Diagnosis is made by an examination by your healthcare provider.  Special test can be performed by your doctor.    Most cases of hemorrhoids can be treated with:  High-fiber diet: Eat more high-fiber foods, which help prevent constipation.  Ask for more detailed fiber information on types and sources of fiber from your healthcare provider.  Fluids: Drink plenty of water.  This helps soften bowel movements so they are easier to pass.  Sitz baths and cold packs: Sitting in lukewarm water two or three times a Borelli for 15 minutes cleases the anal area and may relieve discomfort.  If the water is too hot, swelling around the anus will get worse.  Placing a cloth-covered ice pack on the anus for ten minutes four times a Patriarca can also help reduce selling.  Gently pushing a prolapsed hemorrhoid back inside after the bath or ice pack can be helpful.  Medications: For mild discomfort, your healthcare provider may suggest over-the-counter pain medication or prescribe a cream or ointment for topical use.  The cream may contain witch hazel, zinc oxide or petroleum jelly.  Medicated suppositories are also a treatment option.  Always consult your doctor before applying medications or creams.  Procedures and surgeries: There are also a number of procedures and surgeries to shrink or remove hemorrhoids in more serious cases.  Talk to your physician about these options.  You can often prevent hemorrhoids or keep   them from becoming worse by maintaining a healthy lifestyle.  Eat a fiber-rich diet of fruits, vegetables and whole grains.  Also, drink plenty of water and exercise regularly.   2007, [employer] Doc.30 

## 2018-01-19 ENCOUNTER — Ambulatory Visit: Payer: Self-pay | Admitting: Internal Medicine

## 2018-05-15 ENCOUNTER — Other Ambulatory Visit: Payer: Self-pay | Admitting: Internal Medicine

## 2018-07-08 ENCOUNTER — Encounter: Payer: Self-pay | Admitting: Internal Medicine

## 2018-08-24 ENCOUNTER — Ambulatory Visit (INDEPENDENT_AMBULATORY_CARE_PROVIDER_SITE_OTHER): Payer: Managed Care, Other (non HMO) | Admitting: Internal Medicine

## 2018-08-24 ENCOUNTER — Encounter: Payer: Self-pay | Admitting: Internal Medicine

## 2018-08-24 VITALS — BP 124/82 | HR 88 | Temp 97.7°F | Resp 18 | Ht 71.0 in | Wt 185.0 lb

## 2018-08-24 DIAGNOSIS — E559 Vitamin D deficiency, unspecified: Secondary | ICD-10-CM

## 2018-08-24 DIAGNOSIS — Z0000 Encounter for general adult medical examination without abnormal findings: Secondary | ICD-10-CM

## 2018-08-24 DIAGNOSIS — Z131 Encounter for screening for diabetes mellitus: Secondary | ICD-10-CM

## 2018-08-24 DIAGNOSIS — Z1211 Encounter for screening for malignant neoplasm of colon: Secondary | ICD-10-CM

## 2018-08-24 DIAGNOSIS — Z79899 Other long term (current) drug therapy: Secondary | ICD-10-CM

## 2018-08-24 DIAGNOSIS — Z1329 Encounter for screening for other suspected endocrine disorder: Secondary | ICD-10-CM | POA: Diagnosis not present

## 2018-08-24 DIAGNOSIS — Z1389 Encounter for screening for other disorder: Secondary | ICD-10-CM

## 2018-08-24 DIAGNOSIS — Z125 Encounter for screening for malignant neoplasm of prostate: Secondary | ICD-10-CM

## 2018-08-24 DIAGNOSIS — I10 Essential (primary) hypertension: Secondary | ICD-10-CM

## 2018-08-24 DIAGNOSIS — Z111 Encounter for screening for respiratory tuberculosis: Secondary | ICD-10-CM | POA: Diagnosis not present

## 2018-08-24 DIAGNOSIS — E782 Mixed hyperlipidemia: Secondary | ICD-10-CM

## 2018-08-24 DIAGNOSIS — Z87891 Personal history of nicotine dependence: Secondary | ICD-10-CM

## 2018-08-24 DIAGNOSIS — R350 Frequency of micturition: Secondary | ICD-10-CM

## 2018-08-24 DIAGNOSIS — N401 Enlarged prostate with lower urinary tract symptoms: Secondary | ICD-10-CM | POA: Diagnosis not present

## 2018-08-24 DIAGNOSIS — Z1212 Encounter for screening for malignant neoplasm of rectum: Secondary | ICD-10-CM

## 2018-08-24 DIAGNOSIS — Z130 Encounter for screening for diseases of the blood and blood-forming organs and certain disorders involving the immune mechanism: Secondary | ICD-10-CM

## 2018-08-24 DIAGNOSIS — E349 Endocrine disorder, unspecified: Secondary | ICD-10-CM

## 2018-08-24 DIAGNOSIS — Z13220 Encounter for screening for lipoid disorders: Secondary | ICD-10-CM | POA: Diagnosis not present

## 2018-08-24 DIAGNOSIS — Z136 Encounter for screening for cardiovascular disorders: Secondary | ICD-10-CM

## 2018-08-24 DIAGNOSIS — R7303 Prediabetes: Secondary | ICD-10-CM

## 2018-08-24 DIAGNOSIS — Z0001 Encounter for general adult medical examination with abnormal findings: Secondary | ICD-10-CM

## 2018-08-24 DIAGNOSIS — Z8249 Family history of ischemic heart disease and other diseases of the circulatory system: Secondary | ICD-10-CM

## 2018-08-24 DIAGNOSIS — Z122 Encounter for screening for malignant neoplasm of respiratory organs: Secondary | ICD-10-CM

## 2018-08-24 DIAGNOSIS — R5383 Other fatigue: Secondary | ICD-10-CM

## 2018-08-24 DIAGNOSIS — R0989 Other specified symptoms and signs involving the circulatory and respiratory systems: Secondary | ICD-10-CM

## 2018-08-24 NOTE — Progress Notes (Signed)
[facility]   Unk Pinto, M.D.     Uvaldo Bristle. Silverio Lay, P.A.-C Liane Comber, [place]                [address] [place] [address] Telephone [phone] Telefax [phone] Annual  Screening/Preventative Visit  & Comprehensive Evaluation & Examination     This very nice 62 y.o. MBM presents for a Screening /Preventative Visit & comprehensive evaluation and management of multiple medical co-morbidities.  Patient has been followed for HTN, HLD, Prediabetes and Vitamin D Deficiency.     Patient has COPD and 18 + yr hx/o smoking (65+ pk years)- alleging quitting in August 2016.  I discussed lung cancer screening with him. He was agreeable to undergo a screening low dose CT scan of the chest, but his insurance would not cover last year and he deferred testing.     HTN predates circa 1995. Patient's BP has been controlled at home.  Today's BP is at goal - 124/82. Patient denies any cardiac symptoms as chest pain, palpitations, shortness of breath, dizziness or ankle swelling.     Patient's hyperlipidemia is controlled with diet and medications. Patient denies myalgias or other medication SE's. Last lipids were at goal albeit elevated Trig's: Lab Results  Component Value Date   CHOL 163 11/25/2017   HDL 54 11/25/2017   LDLCALC 78 11/25/2017   TRIG 216 (H) 11/25/2017   CHOLHDL 3.0 11/25/2017      Patient has hx/o prediabetes  (A1c 6.0%/2012 & 6.1%/2015)  and patient denies reactive hypoglycemic symptoms, visual blurring, diabetic polys or paresthesias. Last A1c was not at goal: Lab Results  Component Value Date   HGBA1C 6.0 (H) 11/25/2017       Finally, patient has history of Vitamin D Deficiency and last vitamin D was still slightly  low: Lab Results  Component Value Date   VD25OH 49 06/12/2017   Current Outpatient Medications on File Prior to Visit  Medication Sig  . acyclovir  (ZOVIRAX) 400 MG tablet TAKE ONE TABLET BY MOUTH TWICE A Ducey  . atorvastatin (LIPITOR) 80 MG tablet Take 1 tablet (80 mg total) by mouth daily.  . Cholecalciferol (VITAMIN D PO) Take 2,000 Units by mouth daily.   . finasteride (PROSCAR) 5 MG tablet Take 5 mg by mouth daily.    . hydrocortisone (ANUSOL-HC) 2.5 % rectal cream Apply 3 to 3 x /Mcleroy to rectal irritation  . hydrocortisone (ANUSOL-HC) 25 MG suppository Insert 1 suppository rectally 3 to 4 x / Dralle  . sildenafil (REVATIO) 20 MG tablet TAKE 1 TO 5 TABLETS BY MOUTH DAILY AS NEEDED  . Tamsulosin HCl (FLOMAX) 0.4 MG CAPS Take 0.4 mg by mouth every evening.     No current facility-administered medications on file prior to visit.    No Known Allergies   Past Medical History:  Diagnosis Date  . Labile hypertension   . Osteoarthritis   . Other testicular hypofunction   . Prediabetes    Health Maintenance  Topic Date Due  . Hepatitis C Screening  04-04-1956  . HIV Screening  08/02/1971  . INFLUENZA VACCINE  06/18/2018  . COLONOSCOPY  06/18/2020  . TETANUS/TDAP  12/24/2025   Immunization History  Administered Date(s) Administered  . PPD Test 06/12/2017, 08/24/2018  . Td 11/18/2004  . Tdap 12/25/2015   Last Colon - 06/18/2010 - Dr  L[place]ide f/u in 5 years - overdue  Past Surgical History:  Procedure Laterality Date  . HERNIA REPAIR     Family History  Problem Relation Age of Onset  . Hypertension Mother   . Diabetes Mother   . Cancer Father        colon   Social History   Socioeconomic History  . Marital status: Single    Spouse name: Not on file  . Number of children: Not on file  Tobacco Use  . Smoking status: Former Smoker    Packs/Grossi: 1.50    Years: 41.00    Pack years: 61.50    Types: Cigarettes    Last attempt to quit: 06/26/2015    Years since quitting: 3.1  . Smokeless tobacco: Never Used  Substance and Sexual Activity  . Alcohol use: Yes    Alcohol/week: 2.0 standard drinks    Types: 2 Standard  drinks or equivalent per week    Comment: beers  . Drug use: No  . Sexual activity: Not on file    ROS Constitutional: Denies fever, chills, weight loss/gain, headaches, insomnia,  night sweats or change in appetite. Does c/o fatigue. Eyes: Denies redness, blurred vision, diplopia, discharge, itchy or watery eyes.  ENT: Denies discharge, congestion, post nasal drip, epistaxis, sore throat, earache, hearing loss, dental pain, Tinnitus, Vertigo, Sinus pain or snoring.  Cardio: Denies chest pain, palpitations, irregular heartbeat, syncope, dyspnea, diaphoresis, orthopnea, PND, claudication or edema Respiratory: denies cough, dyspnea, DOE, pleurisy, hoarseness, laryngitis or wheezing.  Gastrointestinal: Denies dysphagia, heartburn, reflux, water brash, pain, cramps, nausea, vomiting, bloating, diarrhea, constipation, hematemesis, melena, hematochezia, jaundice or hemorrhoids Genitourinary: Denies dysuria, frequency, urgency, nocturia, hesitancy, discharge, hematuria or flank pain Musculoskeletal: Denies arthralgia, myalgia, stiffness, Jt. Swelling, pain, limp or strain/sprain. Denies Falls. Skin: Denies puritis, rash, hives, warts, acne, eczema or change in skin lesion Neuro: No weakness, tremor, incoordination, spasms, paresthesia or pain Psychiatric: Denies confusion, memory loss or sensory loss. Denies Depression. Endocrine: Denies change in weight, skin, hair change, nocturia, and paresthesia, diabetic polys, visual blurring or hyper / hypo glycemic episodes.  Heme/Lymph: No excessive bleeding, bruising or enlarged lymph nodes.  Physical Exam  BP 124/82   Pulse 88   Temp 97.7 F (36.5 C)   Resp 18   Ht 5\' 11"  (1.803 m)   Wt 185 lb (83.9 kg)   BMI 25.80 kg/m   General Appearance: Well nourished and well groomed and in no apparent distress. Sl hoarse voice.   Eyes: PERRLA, EOMs, conjunctiva no swelling or erythema, normal fundi and vessels. Sinuses: No frontal/maxillary  tenderness ENT/Mouth: EACs patent / TMs  nl. Nares clear without erythema, swelling, mucoid exudates. Oral hygiene is good. No erythema, swelling, or exudate. Tongue normal, non-obstructing. Tonsils not swollen or erythematous. Hearing normal.  Neck: Supple, thyroid not palpable. No bruits, nodes or JVD. Respiratory: Respiratory effort normal.  BS equal and clear bilateral without rales, rhonci, wheezing or stridor. Cardio: Heart sounds are normal with regular rate and rhythm and no murmurs, rubs or gallops. Peripheral pulses are normal and equal bilaterally without edema. No aortic or femoral bruits. Chest: symmetric with normal excursions and percussion.  Abdomen: Soft, with Nl bowel sounds. Nontender, no guarding, rebound, hernias, masses, or organomegaly.  Lymphatics: Non tender without lymphadenopathy.  Genitourinary: No hernias.Testes nl. DRE - prostate nl for age - smooth & firm w/o nodules. Musculoskeletal: Full ROM all peripheral extremities, joint stability, 5/5 strength, and normal gait. Skin: Warm and dry without  rashes, lesions, cyanosis, clubbing or  ecchymosis.  Neuro: Cranial nerves intact, reflexes equal bilaterally. Normal muscle tone, no cerebellar symptoms. Sensation intact.  Pysch: Alert and oriented X 3 with normal affect, insight and judgment appropriate.   Assessment and Plan  1. Annual Preventative/Screening Exam   2. Labile hypertension  - EKG 12-Lead - [place], RETROPERITNL ABD,  LTD - Urinalysis, Routine w reflex microscopic - Microalbumin / creatinine urine ratio - CBC with Differential/Platelet - COMPLETE METABOLIC PANEL WITH GFR - Magnesium - TSH - DG Chest 2 View; Future  3. Hyperlipidemia, mixed  - EKG 12-Lead - [place], RETROPERITNL ABD,  LTD - Lipid panel - TSH  4. Prediabetes  - EKG 12-Lead - [place], RETROPERITNL ABD,  LTD - Hemoglobin A1c - Insulin, random  5. Vitamin D deficiency  - VITAMIN D 25 Hydroxy  6. Testosterone deficiency  -  Testosterone  7. Screening for colorectal cancer  - POC Hemoccult Bld/Stl - Ambulatory referral to Gastroenterology  8. Prostate cancer screening  - PSA  9. Screening for ischemic heart disease  - EKG 12-Lead  10. FH: hypertension  - EKG 12-Lead - [place], RETROPERITNL ABD,  LTD  11. Former smoker  - EKG 12-Lead - [place], RETROPERITNL ABD,  LTD - DG Chest 2 View; Future  12. Screening for AAA (aortic abdominal aneurysm)  - [place], RETROPERITNL ABD,  LTD  13. Screening examination for pulmonary tuberculosis  - PPD  14. Encounter for screening for lung cancer  - DG Chest 2 View  15. Fatigue  - Iron,Total/Total Iron Binding Cap - Vitamin B12 - Testosterone - CBC with Differential/Platelet - TSH  16. Medication management  - Urinalysis, Routine w reflex microscopic - Microalbumin / creatinine urine ratio - CBC with Differential/Platelet - COMPLETE METABOLIC PANEL WITH GFR - Magnesium - Lipid panel - TSH - Hemoglobin A1c - Insulin, random - VITAMIN D 25 Hydroxyl        Patient was counseled in prudent diet, weight control to achieve/maintain BMI less than 25, BP monitoring, regular exercise and medications as discussed.  Discussed med effects and SE's. Routine screening labs and tests as requested with regular follow-up as recommended. Over 40 minutes of exam, counseling, chart review and high complex critical decision making was performed

## 2018-08-24 NOTE — Patient Instructions (Signed)
Preventive Care for Adults  A healthy lifestyle and preventive care can promote health and wellness. Preventive health guidelines for men include the following key practices:  A routine yearly physical is a good way to check with your health care provider about your health and preventative screening. It is a chance to share any concerns and updates on your health and to receive a thorough exam.  Visit your dentist for a routine exam and preventative care every 6 months. Brush your teeth twice a [place] and floss once a [place]. Good oral hygiene prevents tooth decay and gum disease.  The frequency of eye exams is based on your age, health, family medical history, use of contact lenses, and other factors. Follow your health care provider's recommendations for frequency of eye exams.  Eat a healthy diet. Foods such as vegetables, fruits, whole grains, low-fat dairy products, and lean protein foods contain the nutrients you need without too many calories. Decrease your intake of foods high in solid fats, added sugars, and salt. Eat the right amount of calories for you. Get information about a proper diet from your health care provider, if necessary.  Regular physical exercise is one of the most important things you can do for your health. Most adults should get at least 150 minutes of moderate-intensity exercise (any activity that increases your heart rate and causes you to sweat) each week. In addition, most adults need muscle-strengthening exercises on 2 or more days a week.  Maintain a healthy weight. The body mass index (BMI) is a screening tool to identify possible weight problems. It provides an estimate of body fat based on height and weight. Your health care provider can find your BMI and can help you achieve or maintain a healthy weight. For adults 20 years and older:  A BMI below 18.5 is considered underweight.  A BMI of 18.5 to 24.9 is normal.  A BMI of 25 to 29.9 is considered overweight.  A  BMI of 30 and above is considered obese.  Maintain normal blood lipids and cholesterol levels by exercising and minimizing your intake of saturated fat. Eat a balanced diet with plenty of fruit and vegetables. Blood tests for lipids and cholesterol should begin at age 20 and be repeated every 5 years. If your lipid or cholesterol levels are high, you are over 50, or you are at high risk for heart disease, you may need your cholesterol levels checked more frequently. Ongoing high lipid and cholesterol levels should be treated with medicines if diet and exercise are not working.  If you smoke, find out from your health care provider how to quit. If you do not use tobacco, do not start.  Lung cancer screening is recommended for adults aged 55-80 years who are at high risk for developing lung cancer because of a history of smoking. A yearly low-dose CT scan of the lungs is recommended for people who have at least a 30-pack-year history of smoking and are a current smoker or have quit within the past 15 years. A pack year of smoking is smoking an average of 1 pack of cigarettes a [place] for 1 year (for example: 1 pack a [place] for 30 years or 2 packs a [place] for 15 years). Yearly screening should continue until the smoker has stopped smoking for at least 15 years. Yearly screening should be stopped for people who develop a health problem that would prevent them from having lung cancer treatment.  If you choose to drink alcohol, do   not have more than 2 drinks per [place]. One drink is considered to be 12 ounces (355 mL) of beer, 5 ounces (148 mL) of wine, or 1.5 ounces (44 mL) of liquor.  Avoid use of street drugs. Do not share needles with anyone. Ask for help if you need support or instructions about stopping the use of drugs.  High blood pressure causes heart disease and increases the risk of stroke. Your blood pressure should be checked at least every 1-2 years. Ongoing high blood pressure should be treated with  medicines, if weight loss and exercise are not effective.  If you are 45-79 years old, ask your health care provider if you should take aspirin to prevent heart disease.  Diabetes screening involves taking a blood sample to check your fasting blood sugar level. This should be done once every 3 years, after age 45, if you are within normal weight and without risk factors for diabetes. Testing should be considered at a younger age or be carried out more frequently if you are overweight and have at least 1 risk factor for diabetes.  Colorectal cancer can be detected and often prevented. Most routine colorectal cancer screening begins at the age of 50 and continues through age 75. However, your health care provider may recommend screening at an earlier age if you have risk factors for colon cancer. On a yearly basis, your health care provider may provide home test kits to check for hidden blood in the stool. Use of a small camera at the end of a tube to directly examine the colon (sigmoidoscopy or colonoscopy) can detect the earliest forms of colorectal cancer. Talk to your health care provider about this at age 50, when routine screening begins. Direct exam of the colon should be repeated every 5-10 years through age 75, unless early forms of precancerous polyps or small growths are found.   Talk with your health care provider about prostate cancer screening.  Testicular cancer screening isrecommended for adult males. Screening includes self-exam, a health care provider exam, and other screening tests. Consult with your health care provider about any symptoms you have or any concerns you have about testicular cancer.  Use sunscreen. Apply sunscreen liberally and repeatedly throughout the [place]. You should seek shade when your shadow is shorter than you. Protect yourself by wearing long sleeves, pants, a wide-brimmed hat, and sunglasses year round, whenever you are outdoors.  Once a month, do a whole-body  skin exam, using a mirror to look at the skin on your back. Tell your health care provider about new moles, moles that have irregular borders, moles that are larger than a pencil eraser, or moles that have changed in shape or color.  Stay current with required vaccines (immunizations).  Influenza vaccine. All adults should be immunized every year.  Tetanus, diphtheria, and acellular pertussis (Td, Tdap) vaccine. An adult who has not previously received Tdap or who does not know his vaccine status should receive 1 dose of Tdap. This initial dose should be followed by tetanus and diphtheria toxoids (Td) booster doses every 10 years. Adults with an unknown or incomplete history of completing a 3-dose immunization series with Td-containing vaccines should begin or complete a primary immunization series including a Tdap dose. Adults should receive a Td booster every 10 years.  Varicella vaccine. An adult without evidence of immunity to varicella should receive 2 doses or a second dose if he has previously received 1 dose.  Human papillomavirus (HPV) vaccine. Males aged 13-21 years   who have not received the vaccine previously should receive the 3-dose series. Males aged 22-26 years may be immunized. Immunization is recommended through the age of 26 years for any male who has sex with males and did not get any or all doses earlier. Immunization is recommended for any person with an immunocompromised condition through the age of 26 years if he did not get any or all doses earlier. During the 3-dose series, the second dose should be obtained 4-8 weeks after the first dose. The third dose should be obtained 24 weeks after the first dose and 16 weeks after the second dose.  Zoster vaccine. One dose is recommended for adults aged 60 years or older unless certain conditions are present.    PREVNAR  - Pneumococcal 13-valent conjugate (PCV13) vaccine. When indicated, a person who is uncertain of his immunization  history and has no record of immunization should receive the PCV13 vaccine. An adult aged 19 years or older who has certain medical conditions and has not been previously immunized should receive 1 dose of PCV13 vaccine. This PCV13 should be followed with a dose of pneumococcal polysaccharide (PPSV23) vaccine. The PPSV23 vaccine dose should be obtained at least 1 r more year(s) after the dose of PCV13 vaccine. An adult aged 19 years or older who has certain medical conditions and previously received 1 or more doses of PPSV23 vaccine should receive 1 dose of PCV13. The PCV13 vaccine dose should be obtained 1 or more years after the last PPSV23 vaccine dose.    PNEUMOVAX - Pneumococcal polysaccharide (PPSV23) vaccine. When PCV13 is also indicated, PCV13 should be obtained first. All adults aged 65 years and older should be immunized. An adult younger than age 65 years who has certain medical conditions should be immunized. Any person who resides in a nursing home or long-term care facility should be immunized. An adult smoker should be immunized. People with an immunocompromised condition and certain other conditions should receive both PCV13 and PPSV23 vaccines. People with human immunodeficiency virus (HIV) infection should be immunized as soon as possible after diagnosis. Immunization during chemotherapy or radiation therapy should be avoided. Routine use of PPSV23 vaccine is not recommended for American Indians, [place] Natives, or people younger than 65 years unless there are medical conditions that require PPSV23 vaccine. When indicated, people who have unknown immunization and have no record of immunization should receive PPSV23 vaccine. One-time revaccination 5 years after the first dose of PPSV23 is recommended for people aged 19-64 years who have chronic kidney failure, nephrotic syndrome, asplenia, or immunocompromised conditions. People who received 1-2 doses of PPSV23 before age 65 years should receive  another dose of PPSV23 vaccine at age 65 years or later if at least 5 years have passed since the previous dose. Doses of PPSV23 are not needed for people immunized with PPSV23 at or after age 65 years.    Hepatitis A vaccine. Adults who wish to be protected from this disease, have certain high-risk conditions, work with hepatitis A-infected animals, work in hepatitis A research labs, or travel to or work in countries with a high rate of hepatitis A should be immunized. Adults who were previously unvaccinated and who anticipate close contact with an international adoptee during the first 60 days after arrival in the [place] from a country with a high rate of hepatitis A should be immunized.    Hepatitis B vaccine. Adults should be immunized if they wish to be protected from this disease, have certain high-risk   conditions, may be exposed to blood or other infectious body fluids, are household contacts or sex partners of hepatitis B positive people, are clients or workers in certain care facilities, or travel to or work in countries with a high rate of hepatitis B.   Preventive Service / Frequency   Ages 40 to 64  Blood pressure check.  Lipid and cholesterol check  Lung cancer screening. / Every year if you are aged 55-80 years and have a 30-pack-year history of smoking and currently smoke or have quit within the past 15 years. Yearly screening is stopped once you have quit smoking for at least 15 years or develop a health problem that would prevent you from having lung cancer treatment.  Fecal occult blood test (FOBT) of stool. / Every year beginning at age 50 and continuing until age 75. You may not have to do this test if you get a colonoscopy every 10 years.  Flexible sigmoidoscopy** or colonoscopy.** / Every 5 years for a flexible sigmoidoscopy or every 10 years for a colonoscopy beginning at age 50 and continuing until age 75. Screening for abdominal aortic aneurysm (AAA)  by  ultrasound is recommended for people who have history of high blood pressure or who are current or former smokers. +++++++++++ Recommend Adult Low Dose Aspirin or  coated  Aspirin 81 mg daily  To reduce risk of Colon Cancer 20 %,  Skin Cancer 26 % ,  Malignant Melanoma 46%  and  Pancreatic cancer 60% ++++++++++++++++++++ Vitamin D goal  is between 70-100.  Please make sure that you are taking your Vitamin D as directed.  It is very important as a natural anti-inflammatory  helping hair, skin, and nails, as well as reducing stroke and heart attack risk.  It helps your bones and helps with mood. It also decreases numerous cancer risks so please take it as directed.  Low Vit D is associated with a 200-300% higher risk for CANCER  and 200-300% higher risk for HEART   ATTACK  &  STROKE.   ...................................... It is also associated with higher death rate at younger ages,  autoimmune diseases like Rheumatoid arthritis, Lupus, Multiple Sclerosis.    Also many other serious conditions, like depression, Alzheimer's Dementia, infertility, muscle aches, fatigue, fibromyalgia - just to name a few. +++++++++++++++++++++ Recommend the book "The END of DIETING" by Dr Joel Fuhrman  & the book "The END of DIABETES " by Dr Joel Fuhrman At [employer] - get book & Audio CD's    Being diabetic has a  300% increased risk for heart attack, stroke, cancer, and alzheimer- type vascular dementia. It is very important that you work harder with diet by avoiding all foods that are white. Avoid white rice (brown & wild rice is OK), white potatoes (sweetpotatoes in moderation is OK), White bread or wheat bread or anything made out of white flour like bagels, donuts, rolls, buns, biscuits, cakes, pastries, cookies, pizza crust, and pasta (made from white flour & egg whites) - vegetarian pasta or spinach or wheat pasta is OK. Multigrain breads like Arnold's or Pepperidge [employer], or multigrain sandwich  thins or flatbreads.  Diet, exercise and weight loss can reverse and cure diabetes in the early stages.  Diet, exercise and weight loss is very important in the control and prevention of complications of diabetes which affects every system in your body, ie. Brain - dementia/stroke, eyes - glaucoma/blindness, heart - heart attack/heart failure, kidneys - dialysis, stomach - gastric paralysis, intestines - malabsorption,   nerves - severe painful neuritis, circulation - gangrene & loss of a leg(s), and finally cancer and Alzheimers.    I recommend avoid fried & greasy foods,  sweets/candy, white rice (brown or wild rice or Quinoa is OK), white potatoes (sweet potatoes are OK) - anything made from white flour - bagels, doughnuts, rolls, buns, biscuits,white and wheat breads, pizza crust and traditional pasta made of white flour & egg white(vegetarian pasta or spinach or wheat pasta is OK).  Multi-grain bread is OK - like multi-grain flat bread or sandwich thins. Avoid alcohol in excess. Exercise is also important.    Eat all the vegetables you want - avoid meat, especially red meat and dairy - especially cheese.  Cheese is the most concentrated form of trans-fats which is the worst thing to clog up our arteries. Veggie cheese is OK which can be found in the fresh produce section at [employer] or [employer] or [employer]  ++++++++++++++++++++++ DASH Eating Plan  DASH stands for "Dietary Approaches to Stop Hypertension."   The DASH eating plan is a healthy eating plan that has been shown to reduce high blood pressure (hypertension). Additional health benefits may include reducing the risk of type 2 diabetes mellitus, heart disease, and stroke. The DASH eating plan may also help with weight loss. WHAT DO I NEED TO KNOW ABOUT THE DASH EATING PLAN? For the DASH eating plan, you will follow these general guidelines:  Choose foods with a percent daily value for sodium of less than 5% (as listed on the food  label).  Use salt-free seasonings or herbs instead of table salt or sea salt.  Check with your health care provider or pharmacist before using salt substitutes.  Eat lower-sodium products, often labeled as "lower sodium" or "no salt added."  Eat fresh foods.  Eat more vegetables, fruits, and low-fat dairy products.  Choose whole grains. Look for the word "whole" as the first word in the ingredient list.  Choose fish   Limit sweets, desserts, sugars, and sugary drinks.  Choose heart-healthy fats.  Eat veggie cheese   Eat more home-cooked food and less restaurant, buffet, and fast food.  Limit fried foods.  Cook foods using methods other than frying.  Limit canned vegetables. If you do use them, rinse them well to decrease the sodium.  When eating at a restaurant, ask that your food be prepared with less salt, or no salt if possible.                      WHAT FOODS CAN I EAT? Read Dr Joel Fuhrman's books on The End of Dieting & The End of Diabetes  Grains Whole grain or whole wheat bread. Brown rice. Whole grain or whole wheat pasta. Quinoa, bulgur, and whole grain cereals. Low-sodium cereals. Corn or whole wheat flour tortillas. Whole grain cornbread. Whole grain crackers. Low-sodium crackers.  Vegetables Fresh or frozen vegetables (raw, steamed, roasted, or grilled). Low-sodium or reduced-sodium tomato and vegetable juices. Low-sodium or reduced-sodium tomato sauce and paste. Low-sodium or reduced-sodium canned vegetables.   Fruits All fresh, canned (in natural juice), or frozen fruits.  Protein Products  All fish and seafood.  Dried beans, peas, or lentils. Unsalted nuts and seeds. Unsalted canned beans.  Dairy Low-fat dairy products, such as skim or 1% milk, 2% or reduced-fat cheeses, low-fat ricotta or cottage cheese, or plain low-fat yogurt. Low-sodium or reduced-sodium cheeses.  Fats and Oils Tub margarines without trans fats. Light or reduced-fat mayonnaise    and salad dressings (reduced sodium). Avocado. Safflower, olive, or canola oils. Natural peanut or almond butter.  Other Unsalted popcorn and pretzels. The items listed above may not be a complete list of recommended foods or beverages. Contact your dietitian for more options.  +++++++++++++++++++  WHAT FOODS ARE NOT RECOMMENDED? Grains/ White flour or wheat flour White bread. White pasta. White rice. Refined cornbread. Bagels and croissants. Crackers that contain trans fat.  Vegetables  Creamed or fried vegetables. Vegetables in a . Regular canned vegetables. Regular canned tomato sauce and paste. Regular tomato and vegetable juices.  Fruits Dried fruits. Canned fruit in light or heavy syrup. Fruit juice.  Meat and Other Protein Products Meat in general - RED meat & White meat.  Fatty cuts of meat. Ribs, chicken wings, all processed meats as bacon, sausage, bologna, salami, fatback, hot dogs, bratwurst and packaged luncheon meats.  Dairy Whole or 2% milk, cream, half-and-half, and cream cheese. Whole-fat or sweetened yogurt. Full-fat cheeses or blue cheese. Non-dairy creamers and whipped toppings. Processed cheese, cheese spreads, or cheese curds.  Condiments Onion and garlic salt, seasoned salt, table salt, and sea salt. Canned and packaged gravies. Worcestershire sauce. Tartar sauce. Barbecue sauce. Teriyaki sauce. Soy sauce, including reduced sodium. Steak sauce. Fish sauce. Oyster sauce. Cocktail sauce. Horseradish. Ketchup and mustard. Meat flavorings and tenderizers. Bouillon cubes. Hot sauce. Tabasco sauce. Marinades. Taco seasonings. Relishes.  Fats and Oils Butter, stick margarine, lard, shortening and bacon fat. Coconut, palm kernel, or palm oils. Regular salad dressings.  Pickles and olives. Salted popcorn and pretzels.  The items listed above may not be a complete list of foods and beverages to avoid.    

## 2018-08-25 LAB — MICROALBUMIN / CREATININE URINE RATIO
Creatinine, Urine: 87 mg/dL (ref 20–320)
MICROALB UR: 0.3 mg/dL
MICROALB/CREAT RATIO: 3 ug/mg{creat} (ref <30)

## 2018-08-25 LAB — COMPLETE METABOLIC PANEL WITH GFR
AG RATIO: 1.4 (calc) (ref 1.0–2.5)
ALBUMIN MSPROF: 4 g/dL (ref 3.6–5.1)
ALKALINE PHOSPHATASE (APISO): 69 U/L (ref 40–115)
ALT: 16 U/L (ref 9–46)
AST: 18 U/L (ref 10–35)
BILIRUBIN TOTAL: 0.3 mg/dL (ref 0.2–1.2)
BUN: 8 mg/dL (ref 7–25)
CO2: 29 mmol/L (ref 20–32)
CREATININE: 0.99 mg/dL (ref 0.70–1.25)
Calcium: 9.6 mg/dL (ref 8.6–10.3)
Chloride: 105 mmol/L (ref 98–110)
GFR, Est African American: 94 mL/min/{1.73_m2} (ref >=60)
GFR, Est Non African American: 81 mL/min/{1.73_m2} (ref >=60)
GLOBULIN: 2.9 g/dL (ref 1.9–3.7)
Glucose, Bld: 104 mg/dL — ABNORMAL HIGH (ref 65–99)
POTASSIUM: 4.9 mmol/L (ref 3.5–5.3)
SODIUM: 142 mmol/L (ref 135–146)
Total Protein: 6.9 g/dL (ref 6.1–8.1)

## 2018-08-25 LAB — INSULIN, RANDOM: Insulin: 29.6 u[IU]/mL — ABNORMAL HIGH (ref 2.0–19.6)

## 2018-08-25 LAB — CBC WITH DIFFERENTIAL/PLATELET
BASOS ABS: 91 {cells}/uL (ref 0–200)
Basophils Relative: 1.6 %
EOS ABS: 291 {cells}/uL (ref 15–500)
EOS PCT: 5.1 %
HEMATOCRIT: 35.5 % — AB (ref 38.5–50.0)
HEMOGLOBIN: 12 g/dL — AB (ref 13.2–17.1)
LYMPHS ABS: 2286 {cells}/uL (ref 850–3900)
MCH: 30.8 pg (ref 27.0–33.0)
MCHC: 33.8 g/dL (ref 32.0–36.0)
MCV: 91.3 fL (ref 80.0–100.0)
MPV: 9.7 fL (ref 7.5–12.5)
Monocytes Relative: 6.7 %
NEUTROS PCT: 46.5 %
Neutro Abs: 2651 cells/uL (ref 1500–7800)
Platelets: 305 10*3/uL (ref 140–400)
RBC: 3.89 10*6/uL — ABNORMAL LOW (ref 4.20–5.80)
RDW: 13.5 % (ref 11.0–15.0)
Total Lymphocyte: 40.1 %
WBC: 5.7 10*3/uL (ref 3.8–10.8)
WBCMIX: 382 {cells}/uL (ref 200–950)

## 2018-08-25 LAB — URINALYSIS, ROUTINE W REFLEX MICROSCOPIC
Bilirubin Urine: NEGATIVE
Glucose, UA: NEGATIVE
Hgb urine dipstick: NEGATIVE
KETONES UR: NEGATIVE
Leukocytes, UA: NEGATIVE
Nitrite: NEGATIVE
Protein, ur: NEGATIVE
SPECIFIC GRAVITY, URINE: 1.011 (ref 1.001–1.03)
pH: 5.5 (ref 5.0–8.0)

## 2018-08-25 LAB — LIPID PANEL
CHOLESTEROL: 132 mg/dL (ref <200)
HDL: 45 mg/dL (ref >40)
LDL CHOLESTEROL (CALC): 56 mg/dL
Non-HDL Cholesterol (Calc): 87 mg/dL (calc) (ref <130)
Total CHOL/HDL Ratio: 2.9 (calc) (ref <5.0)
Triglycerides: 264 mg/dL — ABNORMAL HIGH (ref <150)

## 2018-08-25 LAB — HEMOGLOBIN A1C
HEMOGLOBIN A1C: 6 %{Hb} — AB (ref <5.7)
Mean Plasma Glucose: 126 (calc)
eAG (mmol/L): 7 (calc)

## 2018-08-25 LAB — TESTOSTERONE: Testosterone: 246 ng/dL — ABNORMAL LOW (ref 250–827)

## 2018-08-25 LAB — PSA: PSA: 0.1 ng/mL (ref <=4.0)

## 2018-08-25 LAB — TSH: TSH: 1.76 mIU/L (ref 0.40–4.50)

## 2018-08-25 LAB — IRON, TOTAL/TOTAL IRON BINDING CAP
%SAT: 18 % — AB (ref 20–48)
IRON: 60 ug/dL (ref 50–180)
TIBC: 325 mcg/dL (calc) (ref 250–425)

## 2018-08-25 LAB — MAGNESIUM: MAGNESIUM: 2.2 mg/dL (ref 1.5–2.5)

## 2018-08-25 LAB — VITAMIN B12: VITAMIN B 12: 452 pg/mL (ref 200–1100)

## 2018-08-25 LAB — VITAMIN D 25 HYDROXY (VIT D DEFICIENCY, FRACTURES): VIT D 25 HYDROXY: 39 ng/mL (ref 30–100)

## 2018-08-26 ENCOUNTER — Encounter: Payer: Self-pay | Admitting: *Deleted

## 2018-08-26 LAB — TB SKIN TEST
Induration: 0 mm
TB Skin Test: NEGATIVE

## 2018-09-08 ENCOUNTER — Ambulatory Visit (HOSPITAL_COMMUNITY)
Admission: RE | Admit: 2018-09-08 | Discharge: 2018-09-08 | Disposition: A | Discharge status: Home / Self Care | Payer: Managed Care, Other (non HMO) | Source: Ambulatory Visit | Attending: Internal Medicine | Admitting: Internal Medicine

## 2018-09-08 DIAGNOSIS — R0989 Other specified symptoms and signs involving the circulatory and respiratory systems: Secondary | ICD-10-CM | POA: Diagnosis not present

## 2018-09-08 DIAGNOSIS — I10 Essential (primary) hypertension: Secondary | ICD-10-CM | POA: Insufficient documentation

## 2018-09-08 DIAGNOSIS — Z122 Encounter for screening for malignant neoplasm of respiratory organs: Secondary | ICD-10-CM

## 2018-09-08 DIAGNOSIS — F17210 Nicotine dependence, cigarettes, uncomplicated: Secondary | ICD-10-CM | POA: Insufficient documentation

## 2018-09-08 DIAGNOSIS — R918 Other nonspecific abnormal finding of lung field: Secondary | ICD-10-CM | POA: Diagnosis not present

## 2018-09-08 DIAGNOSIS — Z87891 Personal history of nicotine dependence: Secondary | ICD-10-CM

## 2018-09-28 ENCOUNTER — Encounter: Payer: Self-pay | Admitting: Internal Medicine

## 2018-11-26 ENCOUNTER — Ambulatory Visit: Payer: Self-pay | Admitting: Adult Health

## 2018-11-26 ENCOUNTER — Other Ambulatory Visit: Payer: Self-pay | Admitting: Urology

## 2018-12-28 ENCOUNTER — Ambulatory Visit: Payer: Self-pay | Admitting: Adult Health

## 2018-12-31 ENCOUNTER — Other Ambulatory Visit: Payer: Self-pay

## 2018-12-31 ENCOUNTER — Encounter (HOSPITAL_BASED_OUTPATIENT_CLINIC_OR_DEPARTMENT_OTHER): Payer: Self-pay | Admitting: *Deleted

## 2018-12-31 NOTE — Progress Notes (Signed)
Spoke with patient via telephone for pre op interview. NPO after MN. Patient to take Zovirax, Proscar, and Flomax AM of surgery with a sip of water. EKG on chart and in Epic. Arrival time 0530.

## 2019-01-05 ENCOUNTER — Other Ambulatory Visit: Payer: Self-pay

## 2019-01-05 ENCOUNTER — Encounter (HOSPITAL_BASED_OUTPATIENT_CLINIC_OR_DEPARTMENT_OTHER): Payer: Self-pay | Admitting: *Deleted

## 2019-01-05 ENCOUNTER — Ambulatory Visit (HOSPITAL_BASED_OUTPATIENT_CLINIC_OR_DEPARTMENT_OTHER): Payer: Managed Care, Other (non HMO) | Admitting: Certified Registered Nurse Anesthetist

## 2019-01-05 ENCOUNTER — Encounter (HOSPITAL_BASED_OUTPATIENT_CLINIC_OR_DEPARTMENT_OTHER)
Admission: RE | Disposition: A | Discharge status: Home / Self Care | Payer: Self-pay | Source: Home / Self Care | Attending: Urology

## 2019-01-05 ENCOUNTER — Ambulatory Visit (HOSPITAL_BASED_OUTPATIENT_CLINIC_OR_DEPARTMENT_OTHER)
Admission: RE | Admit: 2019-01-05 | Discharge: 2019-01-05 | Disposition: A | Discharge status: Home / Self Care | Payer: Managed Care, Other (non HMO) | Attending: Urology | Admitting: Urology

## 2019-01-05 DIAGNOSIS — N138 Other obstructive and reflux uropathy: Secondary | ICD-10-CM | POA: Insufficient documentation

## 2019-01-05 DIAGNOSIS — R338 Other retention of urine: Secondary | ICD-10-CM | POA: Diagnosis not present

## 2019-01-05 DIAGNOSIS — M1990 Unspecified osteoarthritis, unspecified site: Secondary | ICD-10-CM | POA: Insufficient documentation

## 2019-01-05 DIAGNOSIS — N401 Enlarged prostate with lower urinary tract symptoms: Secondary | ICD-10-CM | POA: Diagnosis present

## 2019-01-05 DIAGNOSIS — Z79899 Other long term (current) drug therapy: Secondary | ICD-10-CM | POA: Insufficient documentation

## 2019-01-05 DIAGNOSIS — R7303 Prediabetes: Secondary | ICD-10-CM | POA: Insufficient documentation

## 2019-01-05 DIAGNOSIS — Z87891 Personal history of nicotine dependence: Secondary | ICD-10-CM | POA: Diagnosis not present

## 2019-01-05 DIAGNOSIS — I10 Essential (primary) hypertension: Secondary | ICD-10-CM | POA: Insufficient documentation

## 2019-01-05 HISTORY — PX: THULIUM LASER TURP (TRANSURETHRAL RESECTION OF PROSTATE): SHX6744

## 2019-01-05 SURGERY — THULIUM LASER TURP (TRANSURETHRAL RESECTION OF PROSTATE)
Anesthesia: General | Site: Prostate

## 2019-01-05 MED ORDER — DEXAMETHASONE SODIUM PHOSPHATE 10 MG/ML IJ SOLN
INTRAMUSCULAR | Status: DC | PRN
  Administered 2019-01-05: 5 mg via INTRAVENOUS

## 2019-01-05 MED ORDER — PROPOFOL 10 MG/ML IV BOLUS
INTRAVENOUS | Status: DC | PRN
  Administered 2019-01-05: 150 mg via INTRAVENOUS

## 2019-01-05 MED ORDER — LACTATED RINGERS IV SOLN
INTRAVENOUS | Status: DC
  Administered 2019-01-05 (×2): via INTRAVENOUS
  Filled 2019-01-05: qty 1000

## 2019-01-05 MED ORDER — FENTANYL CITRATE (PF) 100 MCG/2ML IJ SOLN
INTRAMUSCULAR | Status: AC
  Filled 2019-01-05: qty 2

## 2019-01-05 MED ORDER — CEFAZOLIN SODIUM-DEXTROSE 2-4 GM/100ML-% IV SOLN
2.0000 g | Freq: Once | INTRAVENOUS | Status: AC
  Administered 2019-01-05: 2 g via INTRAVENOUS
  Filled 2019-01-05: qty 100

## 2019-01-05 MED ORDER — DEXAMETHASONE SODIUM PHOSPHATE 10 MG/ML IJ SOLN
INTRAMUSCULAR | Status: AC
  Filled 2019-01-05: qty 1

## 2019-01-05 MED ORDER — LIDOCAINE 2% (20 MG/ML) 5 ML SYRINGE
INTRAMUSCULAR | Status: AC
  Filled 2019-01-05: qty 5

## 2019-01-05 MED ORDER — CEFAZOLIN SODIUM-DEXTROSE 2-4 GM/100ML-% IV SOLN
INTRAVENOUS | Status: AC
  Filled 2019-01-05: qty 100

## 2019-01-05 MED ORDER — PROPOFOL 10 MG/ML IV BOLUS
INTRAVENOUS | Status: AC
  Filled 2019-01-05: qty 20

## 2019-01-05 MED ORDER — SODIUM CHLORIDE 0.9 % IR SOLN
Status: DC | PRN
  Administered 2019-01-05: 6000 mL via INTRAVESICAL

## 2019-01-05 MED ORDER — ONDANSETRON HCL 4 MG/2ML IJ SOLN
INTRAMUSCULAR | Status: DC | PRN
  Administered 2019-01-05: 4 mg via INTRAVENOUS

## 2019-01-05 MED ORDER — FENTANYL CITRATE (PF) 100 MCG/2ML IJ SOLN
INTRAMUSCULAR | Status: DC | PRN
  Administered 2019-01-05 (×4): 25 ug via INTRAVENOUS

## 2019-01-05 MED ORDER — OXYCODONE HCL 5 MG/5ML PO SOLN
5.0000 mg | Freq: Once | ORAL | Status: DC | PRN
  Filled 2019-01-05: qty 5

## 2019-01-05 MED ORDER — MIDAZOLAM HCL 2 MG/2ML IJ SOLN
INTRAMUSCULAR | Status: DC | PRN
  Administered 2019-01-05: 2 mg via INTRAVENOUS

## 2019-01-05 MED ORDER — FENTANYL CITRATE (PF) 100 MCG/2ML IJ SOLN
25.0000 ug | INTRAMUSCULAR | Status: DC | PRN
  Filled 2019-01-05: qty 1

## 2019-01-05 MED ORDER — LIDOCAINE HCL (CARDIAC) PF 100 MG/5ML IV SOSY
PREFILLED_SYRINGE | INTRAVENOUS | Status: DC | PRN
  Administered 2019-01-05: 80 mg via INTRAVENOUS

## 2019-01-05 MED ORDER — MIDAZOLAM HCL 2 MG/2ML IJ SOLN
INTRAMUSCULAR | Status: AC
  Filled 2019-01-05: qty 2

## 2019-01-05 MED ORDER — ONDANSETRON HCL 4 MG/2ML IJ SOLN
4.0000 mg | Freq: Four times a day (QID) | INTRAMUSCULAR | Status: DC | PRN
  Filled 2019-01-05: qty 2

## 2019-01-05 MED ORDER — ONDANSETRON HCL 4 MG/2ML IJ SOLN
INTRAMUSCULAR | Status: AC
  Filled 2019-01-05: qty 2

## 2019-01-05 MED ORDER — OXYCODONE HCL 5 MG PO TABS
5.0000 mg | ORAL_TABLET | Freq: Once | ORAL | Status: DC | PRN
  Filled 2019-01-05: qty 1

## 2019-01-05 SURGICAL SUPPLY — 19 items
BAG DRAIN URO-CYSTO SKYTR STRL (DRAIN) ×3 IMPLANT
BAG DRN UROCATH (DRAIN) ×1
BAG URINE DRAINAGE (UROLOGICAL SUPPLIES) ×3 IMPLANT
CATH COUDE FOLEY 2W 5CC 18FR (CATHETERS) ×2 IMPLANT
CATH FOLEY 3WAY 30CC 22F (CATHETERS) IMPLANT
CLOTH BEACON ORANGE TIMEOUT ST (SAFETY) ×3 IMPLANT
GLOVE BIO SURGEON STRL SZ7.5 (GLOVE) ×3 IMPLANT
GOWN STRL REUS W/TWL XL LVL3 (GOWN DISPOSABLE) ×3 IMPLANT
HOLDER FOLEY CATH W/STRAP (MISCELLANEOUS) ×2 IMPLANT
IV NS IRRIG 3000ML ARTHROMATIC (IV SOLUTION) ×3 IMPLANT
KIT TURNOVER CYSTO (KITS) ×3 IMPLANT
LASER REVOLIX PROCEDURE (MISCELLANEOUS) ×3 IMPLANT
LOOP CUT BIPOLAR 24F LRG (ELECTROSURGICAL) IMPLANT
MANIFOLD NEPTUNE II (INSTRUMENTS) ×3 IMPLANT
PACK CYSTO (CUSTOM PROCEDURE TRAY) ×3 IMPLANT
SYR 30ML LL (SYRINGE) IMPLANT
TUBE CONNECTING 12'X1/4 (SUCTIONS) ×1
TUBE CONNECTING 12X1/4 (SUCTIONS) ×1 IMPLANT
TUBING UROLOGY SET (TUBING) IMPLANT

## 2019-01-05 NOTE — Discharge Instructions (Signed)
Indwelling Urinary Catheter Care, Adult An indwelling urinary catheter is a thin tube that is put into your bladder. The tube helps to drain pee (urine) out of your body. The tube goes in through your urethra. Your urethra is where pee comes out of your body. Your pee will come out through the catheter, then it will go into a bag (drainage bag). Take good care of your catheter so it will work well. How to wear your catheter and bag Supplies needed  Sticky tape (adhesive tape) or a leg strap.  Alcohol wipe or soap and water (if you use tape).  A clean towel (if you use tape).  Large overnight bag.  Smaller bag (leg bag). Wearing your catheter Attach your catheter to your leg with tape or a leg strap.  Make sure the catheter is not pulled tight.  If a leg strap gets wet, take it off and put on a dry strap.  If you use tape to hold the bag on your leg: 1. Use an alcohol wipe or soap and water to wash your skin where the tape made it sticky before. 2. Use a clean towel to pat-dry that skin. 3. Use new tape to make the bag stay on your leg. Wearing your bags You should have been given a large overnight bag.  You may wear the overnight bag in the [place] or night.  Always have the overnight bag lower than your bladder.  Do not let the bag touch the floor.  Before you go to sleep, put a clean plastic bag in a wastebasket. Then hang the overnight bag inside the wastebasket. You should also have a smaller leg bag that fits under your clothes.  Always wear the leg bag below your knee.  Do not wear your leg bag at night. How to care for your skin and catheter Supplies needed  A clean washcloth.  Water and mild soap.  A clean towel. Caring for your skin and catheter      Clean the skin around your catheter every [place]: ? Wash your hands with soap and water. ? Wet a clean washcloth in warm water and mild soap. ? Clean the skin around your urethra. ? If you are male: ? Gently  spread the folds of skin around your vagina (labia). ? With the washcloth in your other hand, wipe the inner side of your labia on each side. Wipe from front to back. ? If you are male: ? Pull back any skin that covers the end of your penis (foreskin). ? With the washcloth in your other hand, wipe your penis in small circles. Start wiping at the tip of your penis, then move away from the catheter. ? With your free hand, hold the catheter close to where it goes into your body. ? Keep holding the catheter during cleaning so it does not get pulled out. ? With the washcloth in your other hand, clean the catheter. ? Only wipe downward on the catheter. ? Do not wipe upward toward your body. Doing this may push germs into your urethra and cause infection. ? Use a clean towel to pat-dry the catheter and the skin around it. Make sure to wipe off all soap. ? Wash your hands with soap and water.  Shower every [place]. Do not take baths.  Do not use cream, ointment, or lotion on the area where the catheter goes into your body, unless your doctor tells you to.  Do not use powders, sprays, or lotions  on your genital area.  Check your skin around the catheter every [place] for signs of infection. Check for: ? Redness, swelling, or pain. ? Fluid or blood. ? Warmth. ? Pus or a bad smell. How to empty the bag Supplies needed  Rubbing alcohol.  Gauze pad or cotton ball.  Tape or a leg strap. Emptying the bag Pour the pee out of your bag when it is ?- full, or at least 2-3 times a [place]. Do this for your overnight bag and your leg bag. 1. Wash your hands with soap and water. 2. Separate (detach) the bag from your leg. 3. Hold the bag over the toilet or a clean pail. Keep the bag lower than your hips and bladder. This is so the pee (urine) does not go back into the tube. 4. Open the pour spout. It is at the bottom of the bag. 5. Empty the pee into the toilet or pail. Do not let the pour spout touch any  surface. 6. Put rubbing alcohol on a gauze pad or cotton ball. 7. Use the gauze pad or cotton ball to clean the pour spout. 8. Close the pour spout. 9. Attach the bag to your leg with tape or a leg strap. 10. Wash your hands with soap and water. Follow instructions for cleaning the drainage bag:  From the product maker.  As told by your doctor. How to change the bag Supplies needed  Alcohol wipes.  A clean bag.  Tape or a leg strap. Changing the bag Replace your bag with a clean bag once a month. If it starts to leak, smell bad, or look dirty, change it sooner. 1. Wash your hands with soap and water. 2. Separate the dirty bag from your leg. 3. Pinch the catheter with your fingers so that pee does not spill out. 4. Separate the catheter tube from the bag tube where these tubes connect (at the connection valve). Do not let the tubes touch any surface. 5. Clean the end of the catheter tube with an alcohol wipe. Use a different alcohol wipe to clean the end of the bag tube. 6. Connect the catheter tube to the tube of the clean bag. 7. Attach the clean bag to your leg with tape or a leg strap. Do not make the bag tight on your leg. 8. Wash your hands with soap and water. General rules   Never pull on your catheter. Never try to take it out. Doing that can hurt you.  Always wash your hands before and after you touch your catheter or bag. Use a mild, fragrance-free soap. If you do not have soap and water, use hand sanitizer.  Always make sure there are no twists or bends (kinks) in the catheter tube.  Always make sure there are no leaks in the catheter or bag.  Drink enough fluid to keep your pee pale yellow.  Do not take baths, swim, or use a hot tub.  If you are male, wipe from front to back after you poop (have a bowel movement). Contact a doctor if:  Your pee is cloudy.  Your pee smells worse than usual.  Your catheter gets clogged.  Your catheter leaks.  Your  bladder feels full. Get help right away if:  You have redness, swelling, or pain where the catheter goes into your body.  You have fluid, blood, pus, or a bad smell coming from the area where the catheter goes into your body.  Your skin feels warm  where the catheter goes into your body.  You have a fever.  You have pain in your: ? Belly (abdomen). ? Legs. ? Lower back. ? Bladder.  You see blood in the catheter.  Your pee is pink or red.  You feel sick to your stomach (nauseous).  You throw up (vomit).  You have chills.  Your pee is not draining into the bag.  Your catheter gets pulled out. Summary  An indwelling urinary catheter is a thin tube that is placed into the bladder to help drain pee (urine) out of the body.  The catheter is placed into the part of the body that drains pee from the bladder (urethra).  Taking good care of your catheter will keep it working properly and help prevent problems.  Always wash your hands before and after touching your catheter or bag.  Never pull on your catheter or try to take it out. This information is not intended to replace advice given to you by your health care provider. Make sure you discuss any questions you have with your health care provider. Document Released: 03/01/2013 Document Revised: 04/27/2018 Document Reviewed: 06/20/2017 Elsevier Interactive Patient Education  2019 [place] [facility], Care After  This sheet gives you information about how to care for yourself after your procedure. Your health care provider may also give you more specific instructions. If you have problems or questions, contact your health care provider. What can I expect after the procedure? For the first few weeks after the procedure:  You will feel a need to urinate often.  You may have blood in your urine.  You may feel a sudden need to urinate. Once your urinary catheter is removed, you may have a burning feeling when  you urinate, especially at the end of urination. This feeling usually passes within 3-5 days. Follow these instructions at home: Activity  Return to your normal activities as told by your health care provider. Ask your health care provider what activities are safe for you.  Do not do vigorous exercise for 1 week or as told by your health care provider.  Do not lift anything that is heavier than 10 lb (4.5 kg) until your health care provider say it is safe.  Avoid sexual activity for 4-6 weeks or as told by your health care provider.  Do not ride in a car for extended periods of time for 1 month or as told by your health care provider.  Do not drive for 24 hours if you were given a medicine to help you relax (sedative). Diet  Eat foods that are high in fiber, such as fresh fruits and vegetables, whole grains, and beans.  Drink enough fluid to keep your urine clear or pale yellow. Medicines  Take over-the-counter and prescription medicines, including stool softeners, only as told by your health care provider.  If you were prescribed an antibiotic medicine, take it as told by your health care provider. Do not stop taking the antibiotic even if you start to feel better. General instructions   If you were given elastic support stockings, wear them as told by your health care provider.  Do not strain to have a bowel movement. Straining may lead to bleeding from the prostate and cause clots to form and cause trouble urinating.  Keep all follow-up visits as told by your health care provider. This is important. Contact a health care provider if:  You have a fever or chills.  You have spasms or pain  with the urinary catheter still in place.  Once the catheter has been removed, you experience difficulty starting your stream when attempting to urinate. Get help right away if:  There is a blockage in your catheter.  Your catheter has been removed and you are suddenly unable to  urinate.  Your urine smells unusually bad.  You start to have blood clots in your urine.  The blood in your urine becomes persistent or gets thick.  You develop chest pains.  You develop shortness of breath.  You develop swelling or pain in your leg. Summary  You may notice urinary symptoms for a few weeks after your procedure.  Follow instructions from your health care provider regarding activity restrictions such as lifting, exercise, and sexual activity.  Contact your health care provider if you have any unusual symptoms during your recovery. This information is not intended to replace advice given to you by your health care provider. Make sure you discuss any questions you have with your health care provider. Document Released: 11/04/2005 Document Revised: 06/21/2016 Document Reviewed: 06/21/2016 Elsevier Interactive Patient Education  2019 [place] [facility]est[facility] Home Care Instructions  Activity: Get plenty of rest for the remainder of the Face. A responsible individual must stay with you for 24 hours following the procedure.  For the next 24 hours, DO NOT: -Drive a car -Paediatric nurse -Drink alcoholic beverages -Take any medication unless instructed by your physician -Make any legal decisions or sign important papers.  Meals: Start with liquid foods such as gelatin or soup. Progress to regular foods as tolerated. Avoid greasy, spicy, heavy foods. If nausea and/or vomiting occur, drink only clear liquids until the nausea and/or vomiting subsides. Call your physician if vomiting continues.  Special Instructions/Symptoms: Your throat may feel dry or sore from the anesthesia or the breathing tube placed in your throat during surgery. If this causes discomfort, gargle with warm salt water. The discomfort should disappear within 24 hours.  If you had a scopolamine patch placed behind your ear for the management of post- operative nausea and/or vomiting:  1.  The medication in the patch is effective for 72 hours, after which it should be removed.  Wrap patch in a tissue and discard in the trash. Wash hands thoroughly with soap and water. 2. You may remove the patch earlier than 72 hours if you experience unpleasant side effects which may include dry mouth, dizziness or visual disturbances. 3. Avoid touching the patch. Wash your hands with soap and water after contact with the patch.

## 2019-01-05 NOTE — Transfer of Care (Signed)
Immediate Anesthesia Transfer of Care Note  Patient: Tony Ware  Procedure(s) Performed: Marcelino Duster LASER TURP (TRANSURETHRAL RESECTION OF PROSTATE) (N/A Prostate)  Patient Location: PACU  Anesthesia Type:General  Level of Consciousness: awake, alert , oriented, drowsy and patient cooperative  Airway & Oxygen Therapy: Patient Spontanous Breathing and Patient connected to nasal cannula oxygen  Post-op Assessment: Report given to RN and Post -op Vital signs reviewed and stable  Post vital signs: Reviewed and stable  Last Vitals:  Vitals Value Taken Time  BP 117/77 01/05/2019  8:46 AM  Temp    Pulse 57 01/05/2019  8:48 AM  Resp 11 01/05/2019  8:48 AM  SpO2 100 % 01/05/2019  8:48 AM  Vitals shown include unvalidated device data.  Last Pain:  Vitals:   01/05/19 0533  TempSrc:   PainSc: 0-No pain      Patients Stated Pain Goal: 6 (60/15/61 [id number])  Complications: No apparent anesthesia complications

## 2019-01-05 NOTE — Anesthesia Preprocedure Evaluation (Signed)
Anesthesia Evaluation  Patient identified by MRN, date of birth, ID band Patient awake    Reviewed: Allergy & Precautions, H&P , NPO status , Patient's Chart, lab work & pertinent test results  Airway Mallampati: II   Neck ROM: full    Dental   Pulmonary former smoker,    breath sounds clear to auscultation       Cardiovascular hypertension,  Rhythm:regular Rate:Normal     Neuro/Psych    GI/Hepatic   Endo/Other    Renal/GU    BPH    Musculoskeletal  (+) Arthritis ,   Abdominal   Peds  Hematology   Anesthesia Other Findings   Reproductive/Obstetrics                             Anesthesia Physical Anesthesia Plan  ASA: II  Anesthesia Plan: General   Post-op Pain Management:    Induction: Intravenous  PONV Risk Score and Plan: 2 and Ondansetron, Dexamethasone, Midazolam and Treatment may vary due to age or medical condition  Airway Management Planned: LMA  Additional Equipment:   Intra-op Plan:   Post-operative Plan:   Informed Consent: I have reviewed the patients History and Physical, chart, labs and discussed the procedure including the risks, benefits and alternatives for the proposed anesthesia with the patient or authorized representative who has indicated his/her understanding and acceptance.       Plan Discussed with: CRNA, Anesthesiologist and Surgeon  Anesthesia Plan Comments:         Anesthesia Quick Evaluation

## 2019-01-05 NOTE — Op Note (Signed)
Preoperative diagnosis: BPH, lower urinary tract symptoms, incomplete bladder emptying Postoperative diagnosis: Same  Procedure: Thulium laser vaporization of the prostate  Surgeon: Junious Silk  Anesthesia: General  Indication for procedure: Tony Ware is a 63 year old male with significant lower urinary tract symptoms and a high end obstructing bladder neck on cystoscopy.  Despite maximal medical therapy he had trouble emptying his bladder.  Residuals in the 300-500 range.  Findings: On cystoscopy the bladder neck/median lobe was high and obstructing.  The bladder neck opening was small and tight.  The bladder had a large capacity.  Mild trabeculation.  No stone or foreign body in the bladder.  The prostate was short.  On exam under anesthesia the penis was uncircumcised, but no phimosis.  Normal foreskin without mass or lesion.  The testicles were descended bilaterally and palpably normal.  5 mm right epididymal head cyst.  On DRE the prostate was 20 g and smooth without hard area or nodule.  Description of procedure: After consent was obtained patient brought to the operating room.  After adequate anesthesia he is placed in lithotomy position and prepped and draped in usual sterile fashion.  A timeout was performed to confirm the patient and procedure.  The cystoscope was passed per urethra and the bladder carefully inspected.  The 1000 m laser fiber was then deployed and made an incision at 5:00 and 7:00 and brought this down to the verumontanum.  I then vaporized the median lobe.  The lateral lobes were short and I vaporized the right and left lateral lobes but for majority of vaporization was the median lobe.  The ureteral orifice ease were identified pre-and post vaporization and noted to be normal.  Clear reflux.  Vaporization was not distal to the verumontanum.  There was some apical tissue and anterior tissue but this was left in place.  He had an excellent channel at low pressure and adequate  hemostasis.  The bladder was filled and the scope removed.  An 27 French coud catheter was placed in left to gravity drainage.  I performed an exam under anesthesia.  He was awakened and taken to the recovery room in stable condition.  Complications: None  Blood loss: Minimal  Specimens: None  Drains: 17 French coud catheter  Disposition: Patient stable to PACU

## 2019-01-05 NOTE — OR Nursing (Signed)
15 cc water in balloon

## 2019-01-05 NOTE — Anesthesia Procedure Notes (Signed)
Procedure Name: LMA Insertion Date/Time: 01/05/2019 7:51 AM Performed by: Raenette Rover, CRNA Pre-anesthesia Checklist: Emergency Drugs available, Suction available, Patient identified and Patient being monitored Patient Re-evaluated:Patient Re-evaluated prior to induction Oxygen Delivery Method: Circle system utilized Preoxygenation: Pre-oxygenation with 100% oxygen Induction Type: IV induction LMA: LMA inserted LMA Size: 4.0 Number of attempts: 1 Placement Confirmation: positive ETCO2,  CO2 detector and breath sounds checked- equal and bilateral Tube secured with: Tape Dental Injury: Teeth and Oropharynx as per pre-operative assessment

## 2019-01-05 NOTE — H&P (Signed)
. H&P  Chief Complaint: BPH with lower urinary tract symptoms, incomplete emptying  History of Present Illness: [name] is a 63 year old male with a history of BPH and incomplete bladder emptying.  Post void residuals of run from the 300s to the 500s.  Urodynamics revealed bladder outlet obstruction at the capacity of 828 mL.  He is on tamsulosin twice daily and finasteride.  His September 2019 PSA was 0.066.  Cystoscopy revealed lateral lobe hypertrophy and a high obstructing bladder neck.  He has been well without dysuria or fever.  No gross hematuria.  No cough or congestion.  Past Medical History:  Diagnosis Date  . Labile hypertension   . Osteoarthritis   . Other testicular hypofunction   . Prediabetes    Past Surgical History:  Procedure Laterality Date  . HERNIA REPAIR      Home Medications:  Medications Prior to Admission  Medication Sig Dispense Refill Last Dose  . acyclovir (ZOVIRAX) 400 MG tablet TAKE ONE TABLET BY MOUTH TWICE A Critt[place]on 180 tablet 1 01/04/2019 at Unknown time  . atorvastatin (LIPITOR) 80 MG tablet Take 1 tablet (80 mg total) by mouth daily. 90 tablet 1 Past Week at Unknown time  . Cholecalciferol (VITAMIN D PO) Take 2,000 Units by mouth daily.    Past Week at Unknown time  . finasteride (PROSCAR) 5 MG tablet Take 5 mg by mouth daily.     01/04/2019 at Unknown time  . hydrocortisone (ANUSOL-HC) 25 MG suppository Insert 1 suppository rectally 3 to 4 x / [name] 30 suppository 1 Past Week at Unknown time  . sildenafil (REVATIO) 20 MG tablet TAKE 1 TO 5 TABLETS BY MOUTH DAILY AS NEEDED 90 tablet 2 Past Week at Unknown time  . Tamsulosin HCl (FLOMAX) 0.4 MG CAPS Take 0.4 mg by mouth every evening.     01/05/2019 at 0440   Allergies: No Known Allergies  Family History  Problem Relation Age of Onset  . Hypertension Mother   . Diabetes Mother   . Cancer Father        colon   Social History:  reports that he quit smoking about 3 years ago. His smoking use included  cigarettes. He has a 61.50 pack-year smoking history. He has never used smokeless tobacco. He reports current alcohol use of about 2.0 standard drinks of alcohol per week. He reports that he does not use drugs.  ROS: A complete review of systems was performed.  All systems are negative except for pertinent findings as noted. Review of Systems  All other systems reviewed and are negative.    Physical Exam:  Vital signs in last 24 hours: Temp:  [98.5 F (36.9 C)] 98.5 F (36.9 C) (02/18 0530) Pulse Rate:  [77] 77 (02/18 0530) Resp:  [16] 16 (02/18 0530) BP: (111)/(77) 111/77 (02/18 0530) SpO2:  [100 %] 100 % (02/18 0530) Weight:  [80.5 kg] 80.5 kg (02/18 0530) General:  Alert and oriented, No acute distress HEENT: Normocephalic, atraumatic Cardiovascular: Regular rate and rhythm Lungs: Regular rate and effort Abdomen: Soft, nontender, nondistended, no abdominal masses Back: No CVA tenderness Extremities: No edema Neurologic: Grossly intact  Laboratory Data:  No results found for this or any previous visit (from the past 24 hour(s)). No results found for this or any previous visit (from the past 240 hour(s)). Creatinine: No results for input(s): CREATININE in the last 168 hours.  Impression/Assessment:  Bph, luts, inc emptying --   Plan:  I discussed with the patient and  his daughter the nature, potential benefits, risks and alternatives to thulium laser vaporization of the prostate, including side effects of the proposed treatment, the likelihood of the patient achieving the goals of the procedure, and any potential problems that might occur during the procedure or recuperation. All questions answered. Patient elects to proceed.    Festus Aloe 01/05/2019, 7:28 AM

## 2019-01-06 ENCOUNTER — Encounter (HOSPITAL_BASED_OUTPATIENT_CLINIC_OR_DEPARTMENT_OTHER): Payer: Self-pay | Admitting: Urology

## 2019-01-07 NOTE — Anesthesia Postprocedure Evaluation (Signed)
Anesthesia Post Note  Patient: Tony Ware  Procedure(s) Performed: THULIUM LASER TURP (TRANSURETHRAL RESECTION OF PROSTATE) (N/A Prostate)     Patient location during evaluation: PACU Anesthesia Type: General Level of consciousness: awake and alert Pain management: pain level controlled Vital Signs Assessment: post-procedure vital signs reviewed and stable Respiratory status: spontaneous breathing, nonlabored ventilation, respiratory function stable and patient connected to nasal cannula oxygen Cardiovascular status: blood pressure returned to baseline and stable Postop Assessment: no apparent nausea or vomiting Anesthetic complications: no    Last Vitals:  Vitals:   01/05/19 0930 01/05/19 1019  BP: 122/69 122/75  Pulse: (!) 50 (!) 56  Resp: 12 13  Temp:  36.6 C  SpO2: 100% 100%    Last Pain:  Vitals:   01/05/19 1015  TempSrc:   PainSc: 0-No pain                 Keymora Grillot S

## 2019-01-14 ENCOUNTER — Other Ambulatory Visit: Payer: Self-pay | Admitting: Internal Medicine

## 2019-01-14 DIAGNOSIS — K648 Other hemorrhoids: Secondary | ICD-10-CM

## 2019-01-14 DIAGNOSIS — K644 Residual hemorrhoidal skin tags: Secondary | ICD-10-CM

## 2019-01-14 MED ORDER — HYDROCORTISONE ACETATE 25 MG RE SUPP: RECTAL | 3 refills | Status: DC

## 2019-01-14 MED ORDER — HYDROCORTISONE 2.5 % RE CREA: TOPICAL_CREAM | RECTAL | 3 refills | Status: DC

## 2019-01-25 NOTE — Progress Notes (Deleted)
FOLLOW UP  Assessment and Plan:   Labile hypertension Well controlled at this time off of medications Monitor blood pressure at home; patient to call if consistently greater than 130/80 Continue DASH diet.   Reminder to go to the ER if any CP, SOB, nausea, dizziness, severe HA, changes vision/speech, left arm numbness and tingling and jaw pain.  Cholesterol Currently at goal;  Continue low cholesterol diet and exercise.  Check lipid panel.   Prediabetes Continue diet and exercise.  Perform daily foot/skin check, notify office of any concerning changes.  Check A1C  BMI 24 Continue to recommend diet heavy in fruits and veggies and low in animal meats, cheeses, and dairy products, appropriate calorie intake Discuss exercise recommendations routinely Continue to monitor weight at each visit  Vitamin D Def Below goal at last visit; continue supplementation to maintain goal of 60-100 *** Vit D level  Continue diet and meds as discussed. Further disposition pending results of labs. Discussed med's effects and SE's.   Over 30 minutes of exam, counseling, chart review, and critical decision making was performed.   Future Appointments  Date Time Provider [place]  01/27/2019  4:00 PM Liane Comber, NP [facility] None  02/25/2019  3:30 PM Unk Pinto, MD [facility] None  09/29/2019  3:00 PM Unk Pinto, MD [facility] None    ----------------------------------------------------------------------------------------------------------------------  HPI 63 y.o. AA male  presents for 3 month follow up on hypertension, cholesterol, prediabetes, weight and vitamin D deficiency.   Former Smoker (Quit Date: 06/26/2015), 1.5 ppd, 61.5 pack-years Did chantix   BMI is There is no height or weight on file to calculate BMI., he {HAS HAS DDU:20254} been working on diet and exercise. Wt Readings from Last 3 Encounters:  01/05/19 177 lb 6.4 oz (80.5 kg)  08/24/18 185 lb (83.9  kg)  12/04/17 184 lb 3.2 oz (83.6 kg)   His blood pressure {HAS HAS NOT:18834} been controlled at home, today their BP is    He {DOES_DOES YHC:62376} workout. He denies chest pain, shortness of breath, dizziness.   He is on cholesterol medication Atorvastatin *** and denies myalgias. His LDL cholesterol is at goal; trigs remain elevated. The cholesterol last visit was:   Lab Results  Component Value Date   CHOL 132 08/24/2018   HDL 45 08/24/2018   LDLCALC 56 08/24/2018   TRIG 264 (H) 08/24/2018   CHOLHDL 2.9 08/24/2018    He {Has/has not:18111} been working on diet and exercise for prediabetes, and denies {Symptoms; diabetes w/o none:19199}. Last A1C in the office was:  Lab Results  Component Value Date   HGBA1C 6.0 (H) 08/24/2018   Patient is *** on Vitamin D supplement.   Lab Results  Component Value Date   VD25OH 39 08/24/2018        Current Medications:  Current Outpatient Medications on File Prior to Visit  Medication Sig  . acyclovir (ZOVIRAX) 400 MG tablet TAKE ONE TABLET BY MOUTH TWICE A Lupercio  . atorvastatin (LIPITOR) 80 MG tablet Take 1 tablet (80 mg total) by mouth daily.  . Cholecalciferol (VITAMIN D PO) Take 2,000 Units by mouth daily.   . hydrocortisone (ANUSOL-HC) 2.5 % rectal cream Apply rectally 2 to 4 x /Tardiff  . hydrocortisone (ANUSOL-HC) 25 MG suppository Insert 1 suppository rectally 3 to 4 x /Dinan  . sildenafil (REVATIO) 20 MG tablet TAKE 1 TO 5 TABLETS BY MOUTH DAILY AS NEEDED  . Tamsulosin HCl (FLOMAX) 0.4 MG CAPS Take 0.4 mg by mouth every evening.  No current facility-administered medications on file prior to visit.      Allergies: No Known Allergies   Medical History:  Past Medical History:  Diagnosis Date  . Labile hypertension   . Osteoarthritis   . Other testicular hypofunction   . Prediabetes    Family history- Reviewed and unchanged Social history- Reviewed and unchanged   Review of Systems:  Review of Systems  Constitutional:  Negative for malaise/fatigue and weight loss.  HENT: Negative for hearing loss and tinnitus.   Eyes: Negative for blurred vision and double vision.  Respiratory: Negative for cough, shortness of breath and wheezing.   Cardiovascular: Negative for chest pain, palpitations, orthopnea, claudication and leg swelling.  Gastrointestinal: Negative for abdominal pain, blood in stool, constipation, diarrhea, heartburn, melena, nausea and vomiting.  Genitourinary: Negative.   Musculoskeletal: Negative for joint pain and myalgias.  Skin: Negative for rash.  Neurological: Negative for dizziness, tingling, sensory change, weakness and headaches.  Endo/Heme/Allergies: Negative for polydipsia.  Psychiatric/Behavioral: Negative.   All other systems reviewed and are negative.     Physical Exam: There were no vitals taken for this visit. Wt Readings from Last 3 Encounters:  01/05/19 177 lb 6.4 oz (80.5 kg)  08/24/18 185 lb (83.9 kg)  12/04/17 184 lb 3.2 oz (83.6 kg)   General Appearance: Well nourished, in no apparent distress. Eyes: PERRLA, EOMs, conjunctiva no swelling or erythema Sinuses: No Frontal/maxillary tenderness ENT/Mouth: Ext aud canals clear, TMs without erythema, bulging. No erythema, swelling, or exudate on post pharynx.  Tonsils not swollen or erythematous. Hearing normal.  Neck: Supple, thyroid normal.  Respiratory: Respiratory effort normal, BS equal bilaterally without rales, rhonchi, wheezing or stridor.  Cardio: RRR with no MRGs. Brisk peripheral pulses without edema.  Abdomen: Soft, + BS.  Non tender, no guarding, rebound, hernias, masses. Lymphatics: Non tender without lymphadenopathy.  Musculoskeletal: Full ROM, 5/5 strength, {PSY - GAIT AND STATION:22860} gait Skin: Warm, dry without rashes, lesions, ecchymosis.  Neuro: Cranial nerves intact. No cerebellar symptoms.  Psych: Awake and oriented X 3, normal affect, Insight and Judgment appropriate.    Izora Ribas,  NP 1:39 PM [facility]

## 2019-01-27 ENCOUNTER — Ambulatory Visit: Payer: Self-pay | Admitting: Adult Health

## 2019-02-09 ENCOUNTER — Other Ambulatory Visit: Payer: Self-pay | Admitting: Internal Medicine

## 2019-02-12 IMAGING — CR DG CHEST 2V
2 series · 2 of 2 positions shown · non-contrast
Comparison: 08/19/2013

CLINICAL DATA: Annual physical exam, long-term smoker, hypertension

EXAM:
CHEST - 2 VIEW

[chest pa]
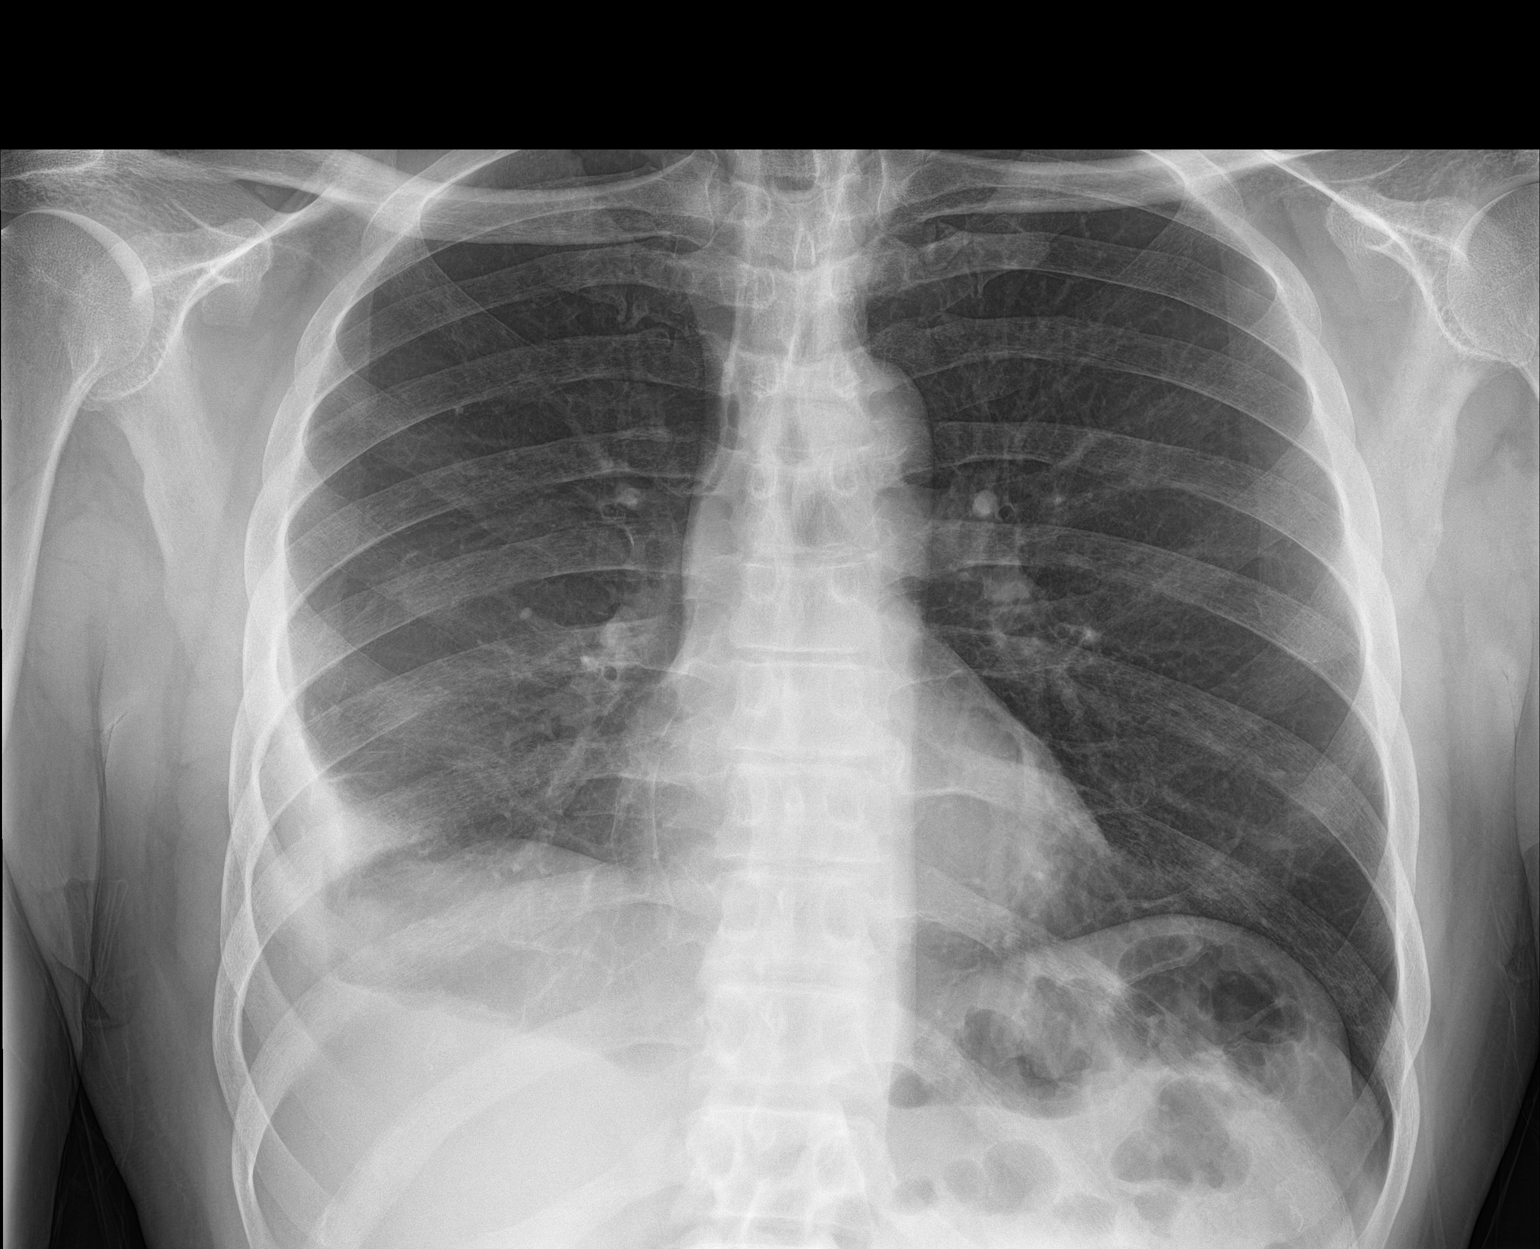

[chest lat]
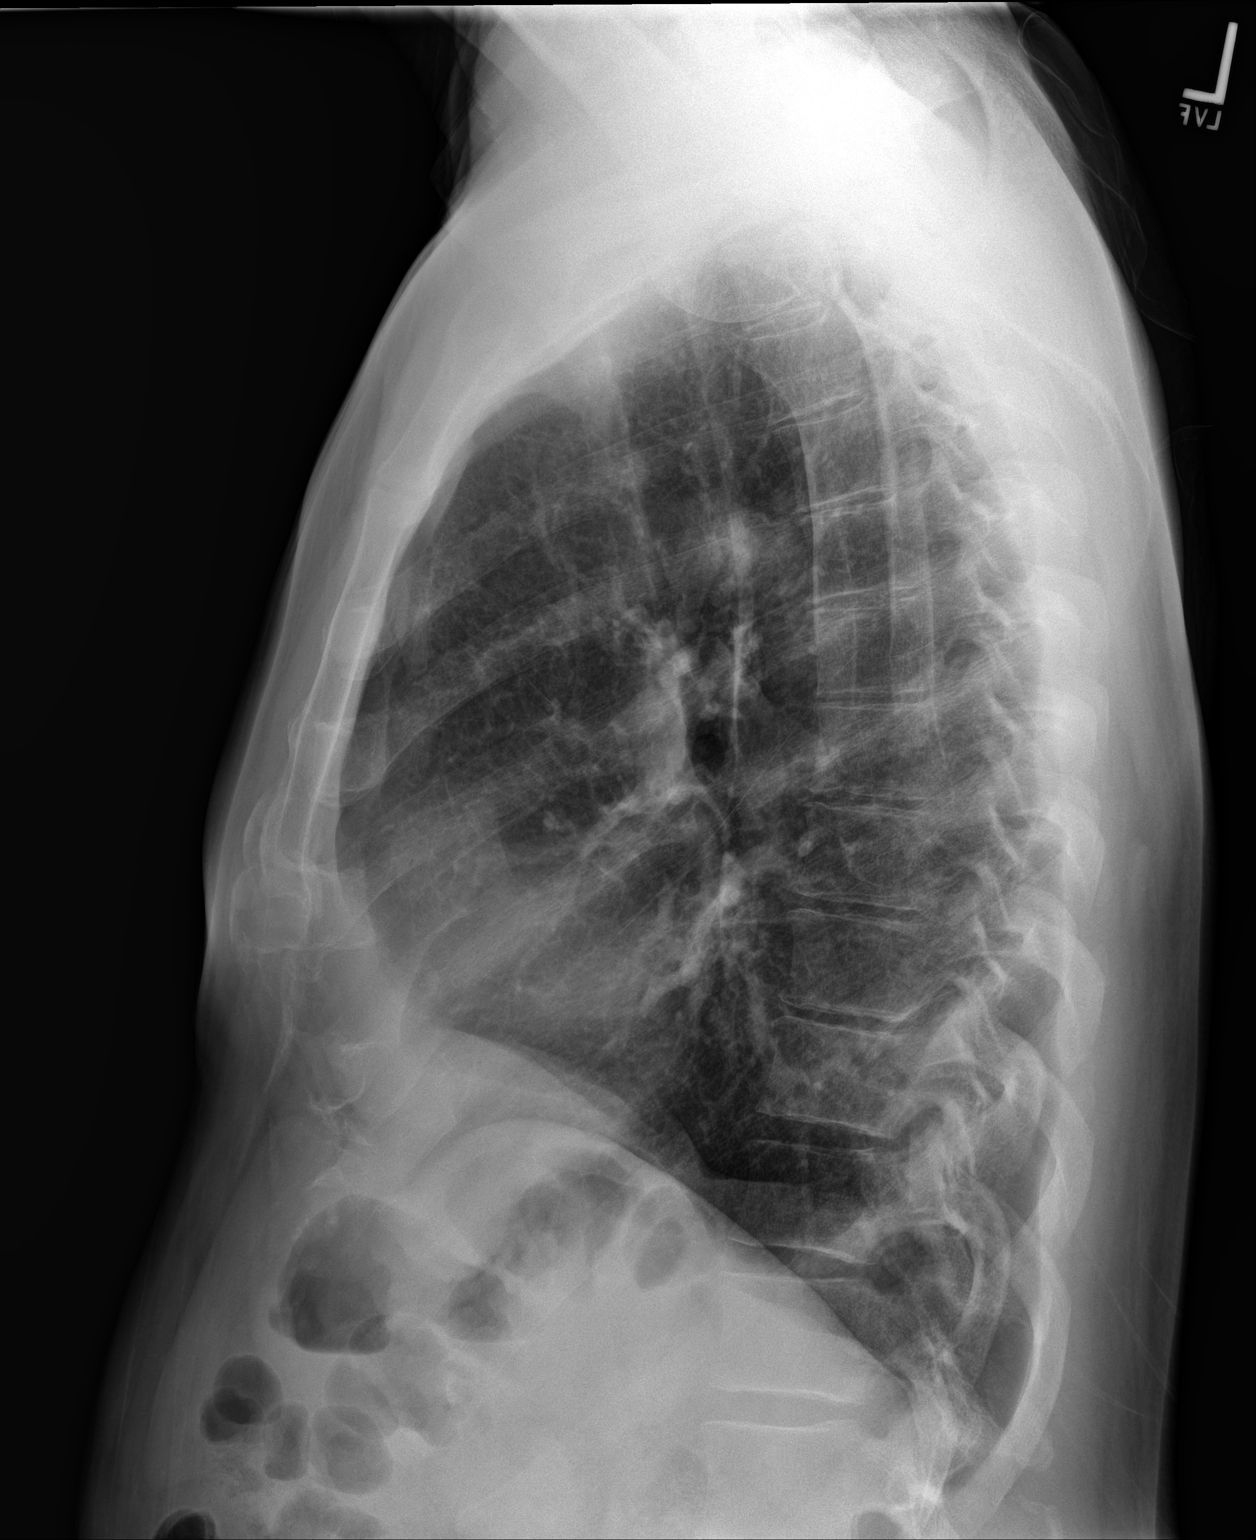

[2 of 2 positions shown; findings below may reference images not displayed]

FINDINGS: Normal heart size, mediastinal contours, and pulmonary vascularity.

Chronic pleural thickening versus effusion at lateral RIGHT base
with minimal atelectasis.

Remaining lungs clear.

No acute infiltrate, pneumothorax, or LEFT pleural effusion.

No acute osseous findings.
IMPRESSION: Chronic scarring versus effusion at lateral RIGHT lung base.

No acute abnormalities.

## 2019-02-24 ENCOUNTER — Other Ambulatory Visit: Payer: Self-pay | Admitting: Internal Medicine

## 2019-02-25 ENCOUNTER — Ambulatory Visit: Payer: Self-pay | Admitting: Internal Medicine

## 2019-05-06 ENCOUNTER — Other Ambulatory Visit: Payer: Self-pay | Admitting: Internal Medicine

## 2019-05-09 ENCOUNTER — Encounter: Payer: Self-pay | Admitting: Internal Medicine

## 2019-05-09 NOTE — Patient Instructions (Addendum)

## 2019-05-09 NOTE — Progress Notes (Signed)
History of Present Illness:      This very nice 63 y.o.  MBM presents for 6 month follow up with HTN, HLD, Pre-Diabetes and Vitamin D Deficiency. Patient has COPD consequent of a 41+ yr smoking hx (65 pk yrs) alleged quitting in 06/2015.        Patient has been followed with labile HTN (1995) & BP has been controlled at home. Today's BP is at goal - 32/82. Patient has had no complaints of any cardiac type chest pain, palpitations, dyspnea / orthopnea / PND, dizziness, claudication, or dependent edema.      Hyperlipidemia is controlled with diet & Atorvastatin. Patient denies myalgias or other med SE's. Last Lipids were at goal albeit elevated Trig's: Lab Results  Component Value Date   CHOL 132 08/24/2018   HDL 45 08/24/2018   LDLCALC 56 08/24/2018   TRIG 264 (H) 08/24/2018   CHOLHDL 2.9 08/24/2018       Also, the patient has history of PreDiabetes (A1c 6.0% / 2012 & 6.1% / 2015)  and has had no symptoms of reactive hypoglycemia, diabetic polys, paresthesias or visual blurring.  Last A1c was not at goal: Lab Results  Component Value Date   HGBA1C 6.0 (H) 08/24/2018       Further, the patient also has history of Vitamin D Deficiency and supplements vitamin D without any suspected side-effects. Last vitamin D was still low: Lab Results  Component Value Date   VD25OH 39 08/24/2018   Current Outpatient Medications on File Prior to Visit  Medication Sig  . acyclovir (ZOVIRAX) 400 MG tablet Take 1 tablet 2 x /Cavagnaro to prevent fever blisters  . atorvastatin (LIPITOR) 80 MG tablet TAKE ONE TABLET BY MOUTH DAILY  . Cholecalciferol (VITAMIN D PO) Take 2,000 Units by mouth daily.   . sildenafil (REVATIO) 20 MG tablet TAKE 1 TO 5 TABLETS BY MOUTH ONCE DAILY AS NEEDED   No current facility-administered medications on file prior to visit.    PMHx:   Past Medical History:  Diagnosis Date  . Labile hypertension   . Osteoarthritis   . Other testicular hypofunction   . Prediabetes     Immunization History  Administered Date(s) Administered  . PPD Test 06/12/2017, 08/24/2018  . Td 11/18/2004  . Tdap 12/25/2015   Past Surgical History:  Procedure Laterality Date  . HERNIA REPAIR    . THULIUM LASER TURP (TRANSURETHRAL RESECTION OF PROSTATE) N/A 01/05/2019   Procedure: THULIUM LASER TURP (TRANSURETHRAL RESECTION OF PROSTATE);  Surgeon: Festus Aloe, MD;  Location: [facility];  Service: Urology;  Laterality: N/A;   FHx:    Reviewed / unchanged  SHx:    Reviewed / unchanged   Systems Review:  Constitutional: Denies fever, chills, wt changes, headaches, insomnia, fatigue, night sweats, change in appetite. Eyes: Denies redness, blurred vision, diplopia, discharge, itchy, watery eyes.  ENT: Denies discharge, congestion, post nasal drip, epistaxis, sore throat, earache, hearing loss, dental pain, tinnitus, vertigo, sinus pain, snoring.  CV: Denies chest pain, palpitations, irregular heartbeat, syncope, dyspnea, diaphoresis, orthopnea, PND, claudication or edema. Respiratory: denies cough, dyspnea, DOE, pleurisy, hoarseness, laryngitis, wheezing.  Gastrointestinal: Denies dysphagia, odynophagia, heartburn, reflux, water brash, abdominal pain or cramps, nausea, vomiting, bloating, diarrhea, constipation, hematemesis, melena, hematochezia  or hemorrhoids. Genitourinary: Denies dysuria, frequency, urgency, nocturia, hesitancy, discharge, hematuria or flank pain. Musculoskeletal: Denies arthralgias, myalgias, stiffness, jt. swelling, pain, limping or strain/sprain.  Skin: Denies pruritus, rash, hives, warts, acne, eczema or change in skin lesion(s). Neuro:  No weakness, tremor, incoordination, spasms, paresthesia or pain. Psychiatric: Denies confusion, memory loss or sensory loss. Endo: Denies change in weight, skin or hair change.  Heme/Lymph: No excessive bleeding, bruising or enlarged lymph nodes.  Physical Exam  BP 132/82   Pulse 88   Temp (!) 97.2 F  (36.2 C)   Resp 16   Ht 5\' 11"  (1.803 m)   Wt 180 lb 6.4 oz (81.8 kg)   BMI 25.16 kg/m   Appears  well nourished, well groomed  and in no distress.  Eyes: PERRLA, EOMs, conjunctiva no swelling or erythema. Sinuses: No frontal/maxillary tenderness ENT/Mouth: EAC's clear, TM's nl w/o erythema, bulging. Nares clear w/o erythema, swelling, exudates. Oropharynx clear without erythema or exudates. Oral hygiene is good. Tongue normal, non obstructing. Hearing intact.  Neck: Supple. Thyroid not palpable. Car 2+/2+ without bruits, nodes or JVD. Chest: Respirations nl with BS clear & equal w/o rales, rhonchi, wheezing or stridor.  Cor: Heart sounds normal w/ regular rate and rhythm without sig. murmurs, gallops, clicks or rubs. Peripheral pulses normal and equal  without edema.  Abdomen: Soft & bowel sounds normal. Non-tender w/o guarding, rebound, hernias, masses or organomegaly.  Lymphatics: Unremarkable.  Musculoskeletal: Full ROM all peripheral extremities, joint stability, 5/5 strength and normal gait.  Skin: Warm, dry without exposed rashes, lesions or ecchymosis apparent.  Neuro: Cranial nerves intact, reflexes equal bilaterally. Sensory-motor testing grossly intact. Tendon reflexes grossly intact.  Pysch: Alert & oriented x 3.  Insight and judgement nl & appropriate. No ideations.  Assessment and Plan:  1. Labile hypertension  - Continue medication, monitor blood pressure at home.  - Continue DASH diet.  Reminder to go to the ER if any CP,  SOB, nausea, dizziness, severe HA, changes vision/speech.  - CBC with Differential/Platelet - COMPLETE METABOLIC PANEL WITH GFR - Magnesium - TSH  2. Hyperlipidemia, mixed  - Continue diet/meds, exercise,& lifestyle modifications.  - Continue monitor periodic cholesterol/liver & renal functions   - Lipid panel - TSH  3. Abnormal glucose  - Hemoglobin A1c - Insulin, random  4. Vitamin D deficiency  - Continue diet, exercise  -  Lifestyle modifications.  - Monitor appropriate labs. - Continue supplementation.  - VITAMIN D 25 Hydroxyl  5. Prediabetes  - Hemoglobin A1c - Insulin, random  6. Medication management  - CBC with Differential/Platelet - COMPLETE METABOLIC PANEL WITH GFR - Magnesium - Lipid panel - TSH - Hemoglobin A1c - Insulin, random - VITAMIN D 25 Hydroxyl       Discussed  regular exercise, BP monitoring, weight control to achieve/maintain BMI less than 25 and discussed med and SE's. Recommended labs to assess and monitor clinical status with further disposition pending results of labs. I discussed the assessment and treatment plan with the patient. The patient was provided an opportunity to ask questions and all were answered. The patient agreed with the plan and demonstrated an understanding of the instructions. I provided over 30 minutes of exam, counseling, chart review and  complex critical decision making.   Kirtland Bouchard, MD

## 2019-05-10 ENCOUNTER — Ambulatory Visit: Payer: Managed Care, Other (non HMO) | Admitting: Internal Medicine

## 2019-05-10 ENCOUNTER — Other Ambulatory Visit: Payer: Self-pay

## 2019-05-10 VITALS — BP 132/82 | HR 88 | Temp 97.2°F | Resp 16 | Ht 71.0 in | Wt 180.4 lb

## 2019-05-10 DIAGNOSIS — Z79899 Other long term (current) drug therapy: Secondary | ICD-10-CM

## 2019-05-10 DIAGNOSIS — R7309 Other abnormal glucose: Secondary | ICD-10-CM | POA: Diagnosis not present

## 2019-05-10 DIAGNOSIS — E559 Vitamin D deficiency, unspecified: Secondary | ICD-10-CM | POA: Diagnosis not present

## 2019-05-10 DIAGNOSIS — E782 Mixed hyperlipidemia: Secondary | ICD-10-CM | POA: Diagnosis not present

## 2019-05-10 DIAGNOSIS — R0989 Other specified symptoms and signs involving the circulatory and respiratory systems: Secondary | ICD-10-CM | POA: Diagnosis not present

## 2019-05-10 DIAGNOSIS — R7303 Prediabetes: Secondary | ICD-10-CM

## 2019-05-11 LAB — CBC WITH DIFFERENTIAL/PLATELET
Absolute Monocytes: 374 cells/uL (ref 200–950)
Basophils Absolute: 88 cells/uL (ref 0–200)
Basophils Relative: 1.7 %
Eosinophils Absolute: 260 cells/uL (ref 15–500)
Eosinophils Relative: 5 %
HCT: 35.5 % — ABNORMAL LOW (ref 38.5–50.0)
Hemoglobin: 11.9 g/dL — ABNORMAL LOW (ref 13.2–17.1)
Lymphs Abs: 2543 cells/uL (ref 850–3900)
MCH: 30.8 pg (ref 27.0–33.0)
MCHC: 33.5 g/dL (ref 32.0–36.0)
MCV: 92 fL (ref 80.0–100.0)
MPV: 9.7 fL (ref 7.5–12.5)
Monocytes Relative: 7.2 %
Neutro Abs: 1934 cells/uL (ref 1500–7800)
Neutrophils Relative %: 37.2 %
Platelets: 289 10*3/uL (ref 140–400)
RBC: 3.86 10*6/uL — ABNORMAL LOW (ref 4.20–5.80)
RDW: 13.7 % (ref 11.0–15.0)
Total Lymphocyte: 48.9 %
WBC: 5.2 10*3/uL (ref 3.8–10.8)

## 2019-05-11 LAB — LIPID PANEL
Cholesterol: 159 mg/dL (ref <200)
HDL: 48 mg/dL (ref >=40)
LDL Cholesterol (Calc): 88 mg/dL (calc)
Non-HDL Cholesterol (Calc): 111 mg/dL (calc) (ref <130)
Total CHOL/HDL Ratio: 3.3 (calc) (ref <5.0)
Triglycerides: 135 mg/dL (ref <150)

## 2019-05-11 LAB — COMPLETE METABOLIC PANEL WITH GFR
AG Ratio: 1.5 (calc) (ref 1.0–2.5)
ALT: 15 U/L (ref 9–46)
AST: 20 U/L (ref 10–35)
Albumin: 4 g/dL (ref 3.6–5.1)
Alkaline phosphatase (APISO): 63 U/L (ref 35–144)
BUN: 10 mg/dL (ref 7–25)
CO2: 29 mmol/L (ref 20–32)
Calcium: 9.5 mg/dL (ref 8.6–10.3)
Chloride: 104 mmol/L (ref 98–110)
Creat: 1.08 mg/dL (ref 0.70–1.25)
GFR, Est African American: 85 mL/min/{1.73_m2} (ref >=60)
GFR, Est Non African American: 73 mL/min/{1.73_m2} (ref >=60)
Globulin: 2.6 g/dL (calc) (ref 1.9–3.7)
Glucose, Bld: 74 mg/dL (ref 65–99)
Potassium: 4.1 mmol/L (ref 3.5–5.3)
Sodium: 139 mmol/L (ref 135–146)
Total Bilirubin: 0.3 mg/dL (ref 0.2–1.2)
Total Protein: 6.6 g/dL (ref 6.1–8.1)

## 2019-05-11 LAB — INSULIN, RANDOM: Insulin: 7.8 u[IU]/mL

## 2019-05-11 LAB — HEMOGLOBIN A1C
Hgb A1c MFr Bld: 5.8 % of total Hgb — ABNORMAL HIGH (ref <5.7)
Mean Plasma Glucose: 120 (calc)
eAG (mmol/L): 6.6 (calc)

## 2019-05-11 LAB — MAGNESIUM: Magnesium: 2.4 mg/dL (ref 1.5–2.5)

## 2019-05-11 LAB — TSH: TSH: 0.9 mIU/L (ref 0.40–4.50)

## 2019-05-11 LAB — VITAMIN D 25 HYDROXY (VIT D DEFICIENCY, FRACTURES): Vit D, 25-Hydroxy: 47 ng/mL (ref 30–100)

## 2019-05-17 ENCOUNTER — Encounter: Payer: Self-pay | Admitting: *Deleted

## 2019-06-30 NOTE — Progress Notes (Signed)
THIS ENCOUNTER IS A VIRTUAL/TELEPHONE VISIT DUE TO COVID-19 - PATIENT WAS NOT SEEN IN THE OFFICE.  PATIENT HAS CONSENTED TO VIRTUAL VISIT / TELEMEDICINE VISIT  This provider placed a call to Tony Ware using telephone, his appointment was changed to a virtual office visit to reduce the risk of exposure to the COVID-19 virus and to help Rayansh F Broden remain healthy and safe. The virtual visit will also provide continuity of care. He verbalizes understanding.   Assessment and Plan:  Hyperlipidemia check lipids decrease fatty foods increase activity.   Labile hypertension - continue medications, DASH diet, exercise and monitor at home. Call if greater than 130/80.   SMOKER Smoking cessation-  instruction/counseling given, counseled patient on the dangers of tobacco use, advised patient to stop smoking, and reviewed strategies to maximize success, patient not ready to quit at this time.   Gastroesophageal reflux disease with esophagitis GERD- will try to get off PPiI given info for taper and zantac sent in If not better make an office visit to come in Will go to the ER if they have any melena (black stool), hematochezia (blood in stool), coffee ground vomiting, severe pain, severe shortness of breath or chest pain.  -     omeprazole (PRILOSEC) 40 MG capsule; Take 1 capsule (40 mg total) by mouth daily for 14 days.    Continue diet and meds as discussed. Further disposition pending results of labs. Over 30 minutes of exam, counseling, chart review, and critical decision making was performed Future Appointments  Date Time Provider [place]  09/29/2019  3:00 PM Unk Pinto, MD [facility] None    HPI 63 y.o. AA male  presents for 3 month follow up on hypertension, cholesterol, prediabetes, and vitamin D deficiency.   He has been having reflux x 2 weeks, tums and pepcid helps.  No new meds, same foods.  He drinks beer occ and he has been taking aleve 1-2 x week but no more than  usual.  No black stool, no blood in stool except when he takes his iron pills.  Some bilateral chest pain, no back pain, no radiation.  No fever, chills.  No trouble swallowing pills or food.   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 He denies chest pain, shortness of breath, dizziness. BMI is There is no height or weight on file to calculate BMI., he is working on diet and exercise. Wt Readings from Last 3 Encounters:  05/10/19 180 lb 6.4 oz (81.8 kg)  01/05/19 177 lb 6.4 oz (80.5 kg)  08/24/18 185 lb (83.9 kg)   Former smoker, quit 2016 but he has started back only 2-3 cigs a [place], will use nicotine patches.   He is on cholesterol medication and denies myalgias. His cholesterol is at goal. The cholesterol last visit was:   Lab Results  Component Value Date   CHOL 159 05/10/2019   HDL 48 05/10/2019   LDLCALC 88 05/10/2019   TRIG 135 05/10/2019   CHOLHDL 3.3 05/10/2019    He has been working on diet and exercise for prediabetes, and denies paresthesia of the feet, polydipsia, polyuria and visual disturbances. Last A1C in the office was:  Lab Results  Component Value Date   HGBA1C 5.8 (H) 05/10/2019   Patient is on Vitamin D supplement.   Lab Results  Component Value Date   VD25OH  47 05/10/2019      Current Medications:  Current Outpatient Medications on File Prior to Visit  Medication Sig Dispense Refill  . acyclovir (ZOVIRAX) 400 MG tablet Take 1 tablet 2 x /Freese to prevent fever blisters 180 tablet 3  . atorvastatin (LIPITOR) 80 MG tablet TAKE ONE TABLET BY MOUTH DAILY 90 tablet 0  . Cholecalciferol (VITAMIN D PO) Take 2,000 Units by mouth daily.     . sildenafil (REVATIO) 20 MG tablet TAKE 1 TO 5 TABLETS BY MOUTH ONCE DAILY AS NEEDED 90 tablet 1   No current facility-administered medications on file prior to visit.    Medical History:  Past Medical History:  Diagnosis  Date  . Labile hypertension   . Osteoarthritis   . Other testicular hypofunction   . Prediabetes    Allergies: Not on File   Review of Systems:  Review of Systems  Constitutional: Negative.  Negative for malaise/fatigue and weight loss.  HENT: Negative for congestion, ear discharge, ear pain, hearing loss, nosebleeds, sore throat and tinnitus.   Eyes: Negative.   Respiratory: Negative.  Negative for stridor.   Cardiovascular: Negative.   Gastrointestinal: Positive for heartburn. Negative for abdominal pain, blood in stool, constipation, diarrhea, melena, nausea and vomiting.  Genitourinary: Negative.   Musculoskeletal: Positive for joint pain (left shoulder with work).  Skin: Negative.   Neurological: Negative.  Negative for weakness and headaches.  Endo/Heme/Allergies: Negative.   Psychiatric/Behavioral: Negative.     Family history- Review and unchanged Social history- Review and unchanged Physical Exam: There were no vitals taken for this visit. Wt Readings from Last 3 Encounters:  05/10/19 180 lb 6.4 oz (81.8 kg)  01/05/19 177 lb 6.4 oz (80.5 kg)  08/24/18 185 lb (83.9 kg)   General Appearance:Well sounding, in no apparent distress.  ENT/Mouth: No hoarseness, No cough for duration of visit.  Respiratory: completing full sentences without distress, without audible wheeze Neuro: Awake and oriented X 3,  Psych:  Insight and Judgment appropriate.     Vicie Mutters, PA-C 4:25 PM [facility]

## 2019-07-05 ENCOUNTER — Other Ambulatory Visit: Payer: Self-pay

## 2019-07-05 ENCOUNTER — Encounter: Payer: Self-pay | Admitting: Physician Assistant

## 2019-07-05 ENCOUNTER — Ambulatory Visit: Payer: Managed Care, Other (non HMO) | Admitting: Physician Assistant

## 2019-07-05 DIAGNOSIS — F17200 Nicotine dependence, unspecified, uncomplicated: Secondary | ICD-10-CM | POA: Diagnosis not present

## 2019-07-05 DIAGNOSIS — K2100 Gastro-esophageal reflux disease with esophagitis, without bleeding: Secondary | ICD-10-CM

## 2019-07-05 DIAGNOSIS — E782 Mixed hyperlipidemia: Secondary | ICD-10-CM

## 2019-07-05 DIAGNOSIS — R0989 Other specified symptoms and signs involving the circulatory and respiratory systems: Secondary | ICD-10-CM

## 2019-07-05 MED ORDER — OMEPRAZOLE 40 MG PO CPDR: 40.0000 mg | DELAYED_RELEASE_CAPSULE | Freq: Every day | ORAL | 0 refills | Status: DC

## 2019-07-10 ENCOUNTER — Other Ambulatory Visit: Payer: Self-pay

## 2019-07-10 DIAGNOSIS — Z20822 Contact with and (suspected) exposure to covid-19: Secondary | ICD-10-CM

## 2019-07-11 LAB — NOVEL CORONAVIRUS, NAA: SARS-CoV-2, NAA: NOT DETECTED

## 2019-07-13 ENCOUNTER — Telehealth: Payer: Self-pay | Admitting: *Deleted

## 2019-07-13 NOTE — Telephone Encounter (Signed)
LM for pt to return call for covid results [phone] M-F [id number]

## 2019-07-13 NOTE — Telephone Encounter (Signed)
Pt notified of negative results

## 2019-09-29 ENCOUNTER — Other Ambulatory Visit: Payer: Self-pay

## 2019-09-29 ENCOUNTER — Ambulatory Visit: Payer: Managed Care, Other (non HMO) | Admitting: Internal Medicine

## 2019-09-29 VITALS — BP 120/70 | HR 72 | Temp 97.1°F | Resp 16 | Ht 70.5 in | Wt 176.8 lb

## 2019-09-29 DIAGNOSIS — R7309 Other abnormal glucose: Secondary | ICD-10-CM

## 2019-09-29 DIAGNOSIS — K219 Gastro-esophageal reflux disease without esophagitis: Secondary | ICD-10-CM

## 2019-09-29 DIAGNOSIS — E559 Vitamin D deficiency, unspecified: Secondary | ICD-10-CM

## 2019-09-29 DIAGNOSIS — E349 Endocrine disorder, unspecified: Secondary | ICD-10-CM | POA: Diagnosis not present

## 2019-09-29 DIAGNOSIS — Z8249 Family history of ischemic heart disease and other diseases of the circulatory system: Secondary | ICD-10-CM | POA: Diagnosis not present

## 2019-09-29 DIAGNOSIS — N138 Other obstructive and reflux uropathy: Secondary | ICD-10-CM

## 2019-09-29 DIAGNOSIS — R7303 Prediabetes: Secondary | ICD-10-CM

## 2019-09-29 DIAGNOSIS — N401 Enlarged prostate with lower urinary tract symptoms: Secondary | ICD-10-CM

## 2019-09-29 DIAGNOSIS — Z87891 Personal history of nicotine dependence: Secondary | ICD-10-CM | POA: Diagnosis not present

## 2019-09-29 DIAGNOSIS — R0989 Other specified symptoms and signs involving the circulatory and respiratory systems: Secondary | ICD-10-CM

## 2019-09-29 DIAGNOSIS — Z111 Encounter for screening for respiratory tuberculosis: Secondary | ICD-10-CM | POA: Diagnosis not present

## 2019-09-29 DIAGNOSIS — Z79899 Other long term (current) drug therapy: Secondary | ICD-10-CM

## 2019-09-29 DIAGNOSIS — Z23 Encounter for immunization: Secondary | ICD-10-CM | POA: Diagnosis not present

## 2019-09-29 DIAGNOSIS — Z1211 Encounter for screening for malignant neoplasm of colon: Secondary | ICD-10-CM

## 2019-09-29 DIAGNOSIS — Z125 Encounter for screening for malignant neoplasm of prostate: Secondary | ICD-10-CM | POA: Diagnosis not present

## 2019-09-29 DIAGNOSIS — E782 Mixed hyperlipidemia: Secondary | ICD-10-CM

## 2019-09-29 DIAGNOSIS — Z0000 Encounter for general adult medical examination without abnormal findings: Secondary | ICD-10-CM

## 2019-09-29 DIAGNOSIS — R5383 Other fatigue: Secondary | ICD-10-CM | POA: Diagnosis not present

## 2019-09-29 DIAGNOSIS — Z0001 Encounter for general adult medical examination with abnormal findings: Secondary | ICD-10-CM

## 2019-09-29 DIAGNOSIS — Z136 Encounter for screening for cardiovascular disorders: Secondary | ICD-10-CM | POA: Diagnosis not present

## 2019-09-29 NOTE — Progress Notes (Signed)
Annual  Screening/Preventative Visit  & Comprehensive Evaluation & Examination     This very nice 63 y.o.  MBM presents for a Screening /Preventative Visit & comprehensive evaluation and management of multiple medical co-morbidities.  Patient has been followed for labile HTN, HLD, Prediabetes and Vitamin D Deficiency. Patient has GERD controlled on his meds.      Patient has COPD consequent of a 33 yr (65+ pk yr) smoking hx and LD Ct scan was deferred due to lack of insurance coverage and std CXR showed no sign of  Cancer. Patient alleges stopping smoking in Aug 2016     In Feb 2020, patient underwent a Laser TURP by Dr Junious Silk.      Labile HTN predates since  1995. Patient's BP has been controlled at home.  Today's BP is at goal - 120/70. Patient denies any cardiac symptoms as chest pain, palpitations, shortness of breath, dizziness or ankle swelling.     Patient's hyperlipidemia is controlled with diet and medications. Patient denies myalgias or other medication SE's. Last lipids were at goal:  Lab Results  Component Value Date   CHOL 159 05/10/2019   HDL 48 05/10/2019   LDLCALC 88 05/10/2019   TRIG 135 05/10/2019   CHOLHDL 3.3 05/10/2019      Patient has hx/o prediabetes since    and patient denies reactive hypoglycemic symptoms, visual blurring, diabetic polys or paresthesias. Last A1c was not at goal:  Lab Results  Component Value Date   HGBA1C 5.8 (H) 05/10/2019       Finally, patient has history of Vitamin D Deficiency of    and last vitamin D was still low:   Lab Results  Component Value Date   VD25OH 47 05/10/2019   Current Outpatient Medications on File Prior to Visit  Medication Sig  . acyclovir (ZOVIRAX) 400 MG tablet Take 1 tablet 2 x /Toran to prevent fever blisters  . Ascorbic Acid (VITAMIN C PO) Take 1 tablet by mouth daily.  Marland Kitchen atorvastatin (LIPITOR) 80 MG tablet TAKE ONE TABLET BY MOUTH DAILY  . Cholecalciferol (VITAMIN D PO) Take 2,000 Units by mouth daily.    Marland Kitchen OVER THE COUNTER MEDICATION Takes an OTC iron tablet daily.  . sildenafil (REVATIO) 20 MG tablet TAKE 1 TO 5 TABLETS BY MOUTH ONCE DAILY AS NEEDED   No current facility-administered medications on file prior to visit.    Not on File Past Medical History:  Diagnosis Date  . Labile hypertension   . Osteoarthritis   . Other testicular hypofunction   . Prediabetes    Health Maintenance  Topic Date Due  . Hepatitis C Screening  06/12/1956  . HIV Screening  08/02/1971  . INFLUENZA VACCINE  06/19/2019  . COLONOSCOPY  06/18/2020  . TETANUS/TDAP  12/24/2025   Immunization History  Administered Date(s) Administered  . Influenza Inj Mdck Quad With Preservative 09/29/2019  . PPD Test 06/12/2017, 08/24/2018, 09/29/2019  . Td 11/18/2004  . Tdap 12/25/2015   Last Colon -  06/18/2010 - Dr Deatra Ina - Recc f/u in 5 years - overdue - patient aware  Past Surgical History:  Procedure Laterality Date  . HERNIA REPAIR    . THULIUM LASER TURP (TRANSURETHRAL RESECTION OF PROSTATE) N/A 01/05/2019   Procedure: THULIUM LASER TURP (TRANSURETHRAL RESECTION OF PROSTATE);  Surgeon: Festus Aloe, MD;  Location: [facility];  Service: Urology;  Laterality: N/A;   Family History  Problem Relation Age of Onset  . Hypertension Mother   .  Diabetes Mother   . Cancer Father        colon   Social History   Socioeconomic History  . Marital status: Single    Spouse name: Not on file  . Number of children: Not on file  . Years of education: Not on file  . Highest education level: Not on file  Occupational History  . Not on file  Tobacco Use  . Smoking status: Former Smoker    Packs/Stoffer: 1.50    Years: 41.00    Pack years: 61.50    Types: Cigarettes    Quit date: 06/26/2015    Years since quitting: 4.2  . Smokeless tobacco: Never Used  Substance and Sexual Activity  . Alcohol use: Yes    Alcohol/week: 2.0 standard drinks    Types: 2 Standard drinks or equivalent per week     Comment: beers  . Drug use: No  . Sexual activity: Yes    ROS Constitutional: Denies fever, chills, weight loss/gain, headaches, insomnia,  night sweats or change in appetite. Does c/o fatigue. Eyes: Denies redness, blurred vision, diplopia, discharge, itchy or watery eyes.  ENT: Denies discharge, congestion, post nasal drip, epistaxis, sore throat, earache, hearing loss, dental pain, Tinnitus, Vertigo, Sinus pain or snoring.  Cardio: Denies chest pain, palpitations, irregular heartbeat, syncope, dyspnea, diaphoresis, orthopnea, PND, claudication or edema Respiratory: denies cough, dyspnea, DOE, pleurisy, hoarseness, laryngitis or wheezing.  Gastrointestinal: Denies dysphagia, heartburn, reflux, water brash, pain, cramps, nausea, vomiting, bloating, diarrhea, constipation, hematemesis, melena, hematochezia, jaundice or hemorrhoids Genitourinary: Denies dysuria, frequency, urgency, nocturia, hesitancy, discharge, hematuria or flank pain Musculoskeletal: Denies arthralgia, myalgia, stiffness, Jt. Swelling, pain, limp or strain/sprain. Denies Falls. Skin: Denies puritis, rash, hives, warts, acne, eczema or change in skin lesion Neuro: No weakness, tremor, incoordination, spasms, paresthesia or pain Psychiatric: Denies confusion, memory loss or sensory loss. Denies Depression. Endocrine: Denies change in weight, skin, hair change, nocturia, and paresthesia, diabetic polys, visual blurring or hyper / hypo glycemic episodes.  Heme/Lymph: No excessive bleeding, bruising or enlarged lymph nodes.  Physical Exam  BP 120/70   Pulse 72   Temp (!) 97.1 F (36.2 C)   Resp 16   Ht 5' 10.5" (1.791 m)   Wt 176 lb 12.8 oz (80.2 kg)   BMI 25.01 kg/m   General Appearance: Well nourished and well groomed and in no apparent distress.  Eyes: PERRLA, EOMs, conjunctiva no swelling or erythema, normal fundi and vessels. Sinuses: No frontal/maxillary tenderness ENT/Mouth: EACs patent / TMs  nl. Nares clear  without erythema, swelling, mucoid exudates. Oral hygiene is good. No erythema, swelling, or exudate. Tongue normal, non-obstructing. Tonsils not swollen or erythematous. Hearing normal.  Neck: Supple, thyroid not palpable. No bruits, nodes or JVD. Respiratory: Respiratory effort normal.  BS equal and clear bilateral without rales, rhonci, wheezing or stridor. Cardio: Heart sounds are normal with regular rate and rhythm and no murmurs, rubs or gallops. Peripheral pulses are normal and equal bilaterally without edema. No aortic or femoral bruits. Chest: symmetric with normal excursions and percussion.  Abdomen: Soft, with Nl bowel sounds. Nontender, no guarding, rebound, hernias, masses, or organomegaly.  Lymphatics: Non tender without lymphadenopathy.  Musculoskeletal: Full ROM all peripheral extremities, joint stability, 5/5 strength, and normal gait. Skin: Warm and dry without rashes, lesions, cyanosis, clubbing or  ecchymosis.  Neuro: Cranial nerves intact, reflexes equal bilaterally. Normal muscle tone, no cerebellar symptoms. Sensation intact.  Pysch: Alert and oriented X 3 with normal affect, insight and judgment appropriate.  Assessment and Plan  1. Annual Preventative/Screening Exam   2. Labile hypertension  - EKG 12-Lead - [place], retroperitnl abd,  ltd - Urinalysis, Routine w reflex microscopic - Microalbumin / Creatinine Urine Ratio - CBC with Diff - COMPLETE METABOLIC PANEL WITH GFR - Magnesium - TSH - DG Chest 2 View; Future  3. Hyperlipidemia, mixed  - EKG 12-Lead - [place], retroperitnl abd,  ltd - Lipid Profile - TSH  4. Abnormal glucose  - EKG 12-Lead - [place], retroperitnl abd,  ltd - Hemoglobin A1c ([facility]) - Insulin, random  5. Vitamin D deficiency  - Vitamin D (25 hydroxy)  6. Prediabetes  - EKG 12-Lead - [place], retroperitnl abd,  ltd - Hemoglobin A1c ([facility]) - Insulin, random  7. Gastroesophageal reflux disease,  hx  - CBC with Diff  8. Testosterone  deficiency  - Testosterone, Total  9. Screening examination for pulmonary tuberculosis  - TB Skin Test  10. Screening for colorectal cancer  - POC Hemoccult Bld/Stl  - Ambulatory referral to Gastroenterology  11. BPH with obstruction/lower urinary tract symptoms  - PSA  12. Prostate cancer screening  - PSA  13. Screening for ischemic heart disease  - EKG 12-Lead - [place], retroperitnl abd,  ltd  14. FH: hypertension  - EKG 12-Lead - [place], retroperitnl abd,  ltd  15. Former smoker  - EKG 12-Lead - [place], retroperitnl abd,  ltd - DG Chest 2 View; Future  16. Screening for AAA (aortic abdominal aneurysm)  - [place], retroperitnl abd,  ltd  17. Fatigue, unspecified type  - Iron,Total/Total Iron Binding Cap - Vitamin B12  18. Medication management  - Urinalysis, Routine w reflex microscopic - Microalbumin / Creatinine Urine Ratio - CBC with Diff - COMPLETE METABOLIC PANEL WITH GFR - Magnesium - Lipid Profile - TSH - Hemoglobin A1c ([facility]) - Insulin, random - Vitamin D (25 hydroxy)  19. Need for immunization against influenza  - FLU VACCINE MDCK QUAD W/Preservative            Patient was counseled in prudent diet, weight control to achieve/maintain BMI less than 25, BP monitoring, regular exercise and medications as discussed.  Discussed med effects and SE's. Routine screening labs and tests as requested with regular follow-up as recommended. Over 40 minutes of exam, counseling, chart review and high complex critical decision making was performed   Kirtland Bouchard, MD

## 2019-09-29 NOTE — Patient Instructions (Signed)
Vit D  & Vit C 1,000 mg   are recommended to help protect  against the Covid-19 and other Corona viruses.    Also it's recommended  to take  Zinc 50 mg  to help  protect against the Covid-19   and best place to get  is also on [employer]  and don't pay more than 6-8 cents /pill !  =============================== Coronavirus (COVID-19) Are you at risk?  Are you at risk for the Coronavirus (COVID-19)?  To be considered HIGH RISK for Coronavirus (COVID-19), you have to meet the following criteria:  . Traveled to [place], [place], [place], [place] or [place]; or in the [place] to [place], [place], [place]  . or [place]; and have fever, cough, and shortness of breath within the last 2 weeks of travel OR . Been in close contact with a person diagnosed with COVID-19 within the last 2 weeks and have  . fever, cough,and shortness of breath .  . IF YOU DO NOT MEET THESE CRITERIA, YOU ARE CONSIDERED LOW RISK FOR COVID-19.  What to do if you are HIGH RISK for COVID-19?  . If you are having a medical emergency, call 911. . Seek medical care right away. Before you go to a doctor's office, urgent care or emergency department, .  call ahead and tell them about your recent travel, contact with someone diagnosed with COVID-19  .  and your symptoms.  . You should receive instructions from your physician's office regarding next steps of care.  . When you arrive at healthcare provider, tell the healthcare staff immediately you have returned from  . visiting [place], [place], [place], [place] or [place]; or traveled in the [place] to [place], [place],  . [place] or [place] in the last two weeks or you have been in close contact with a person diagnosed with  . COVID-19 in the last 2 weeks.   . Tell the health care staff about your symptoms: fever, cough and shortness of breath. . After you have been seen by a medical provider, you will be either: o Tested for (COVID-19) and  discharged home on quarantine except to seek medical care if  o symptoms worsen, and asked to  - Stay home and avoid contact with others until you get your results (4-5 days)  - Avoid travel on public transportation if possible (such as bus, train, or airplane) or o Sent to the Emergency Department by EMS for evaluation, COVID-19 testing  and  o possible admission depending on your condition and test results.  What to do if you are LOW RISK for COVID-19?  Reduce your risk of any infection by using the same precautions used for avoiding the common cold or flu:  . Wash your hands often with soap and warm water for at least 20 seconds.  If soap and water are not readily available,  . use an alcohol-based hand sanitizer with at least 60% alcohol.  . If coughing or sneezing, cover your mouth and nose by coughing or sneezing into the elbow areas of your shirt or coat, .  into a tissue or into your sleeve (not your hands). . Avoid shaking hands with others and consider head nods or verbal greetings only. . Avoid touching your eyes, nose, or mouth with unwashed hands.  . Avoid close contact with people who are sick. . Avoid places or events with large numbers of people in one location, like concerts or sporting events. .   Carefully consider travel plans you have or are making. . If you are planning any travel outside or inside the [place], visit the CDC's Travelers' Health webpage for the latest health notices. . If you have some symptoms but not all symptoms, continue to monitor at home and seek medical attention  . if your symptoms worsen. . If you are having a medical emergency, call 911. >>>>>>>>>>>>>>>>>>>>>>>>>>>> Preventive Care for Adults  A healthy lifestyle and preventive care can promote health and wellness. Preventive health guidelines for men include the following key practices:  A routine yearly physical is a good way to check with your health care provider about your health and  preventative screening. It is a chance to share any concerns and updates on your health and to receive a thorough exam.  Visit your dentist for a routine exam and preventative care every 6 months. Brush your teeth twice a Beals and floss once a Corriher. Good oral hygiene prevents tooth decay and gum disease.  The frequency of eye exams is based on your age, health, family medical history, use of contact lenses, and other factors. Follow your health care provider's recommendations for frequency of eye exams.  Eat a healthy diet. Foods such as vegetables, fruits, whole grains, low-fat dairy products, and lean protein foods contain the nutrients you need without too many calories. Decrease your intake of foods high in solid fats, added sugars, and salt. Eat the right amount of calories for you. Get information about a proper diet from your health care provider, if necessary.  Regular physical exercise is one of the most important things you can do for your health. Most adults should get at least 150 minutes of moderate-intensity exercise (any activity that increases your heart rate and causes you to sweat) each week. In addition, most adults need muscle-strengthening exercises on 2 or more days a week.  Maintain a healthy weight. The body mass index (BMI) is a screening tool to identify possible weight problems. It provides an estimate of body fat based on height and weight. Your health care provider can find your BMI and can help you achieve or maintain a healthy weight. For adults 20 years and older:  A BMI below 18.5 is considered underweight.  A BMI of 18.5 to 24.9 is normal.  A BMI of 25 to 29.9 is considered overweight.  A BMI of 30 and above is considered obese.  Maintain normal blood lipids and cholesterol levels by exercising and minimizing your intake of saturated fat. Eat a balanced diet with plenty of fruit and vegetables. Blood tests for lipids and cholesterol should begin at age 20 and be  repeated every 5 years. If your lipid or cholesterol levels are high, you are over 50, or you are at high risk for heart disease, you may need your cholesterol levels checked more frequently. Ongoing high lipid and cholesterol levels should be treated with medicines if diet and exercise are not working.  If you smoke, find out from your health care provider how to quit. If you do not use tobacco, do not start.  Lung cancer screening is recommended for adults aged 55-80 years who are at high risk for developing lung cancer because of a history of smoking. A yearly low-dose CT scan of the lungs is recommended for people who have at least a 30-pack-year history of smoking and are a current smoker or have quit within the past 15 years. A pack year of smoking is smoking an average of 1   pack of cigarettes a Luviano for 1 year (for example: 1 pack a Dodge for 30 years or 2 packs a Diebold for 15 years). Yearly screening should continue until the smoker has stopped smoking for at least 15 years. Yearly screening should be stopped for people who develop a health problem that would prevent them from having lung cancer treatment.  If you choose to drink alcohol, do not have more than 2 drinks per Aja. One drink is considered to be 12 ounces (355 mL) of beer, 5 ounces (148 mL) of wine, or 1.5 ounces (44 mL) of liquor.  Avoid use of street drugs. Do not share needles with anyone. Ask for help if you need support or instructions about stopping the use of drugs.  High blood pressure causes heart disease and increases the risk of stroke. Your blood pressure should be checked at least every 1-2 years. Ongoing high blood pressure should be treated with medicines, if weight loss and exercise are not effective.  If you are 45-79 years old, ask your health care provider if you should take aspirin to prevent heart disease.  Diabetes screening involves taking a blood sample to check your fasting blood sugar level. This should be done  once every 3 years, after age 45, if you are within normal weight and without risk factors for diabetes. Testing should be considered at a younger age or be carried out more frequently if you are overweight and have at least 1 risk factor for diabetes.  Colorectal cancer can be detected and often prevented. Most routine colorectal cancer screening begins at the age of 50 and continues through age 75. However, your health care provider may recommend screening at an earlier age if you have risk factors for colon cancer. On a yearly basis, your health care provider may provide home test kits to check for hidden blood in the stool. Use of a small camera at the end of a tube to directly examine the colon (sigmoidoscopy or colonoscopy) can detect the earliest forms of colorectal cancer. Talk to your health care provider about this at age 50, when routine screening begins. Direct exam of the colon should be repeated every 5-10 years through age 75, unless early forms of precancerous polyps or small growths are found.   Talk with your health care provider about prostate cancer screening.  Testicular cancer screening isrecommended for adult males. Screening includes self-exam, a health care provider exam, and other screening tests. Consult with your health care provider about any symptoms you have or any concerns you have about testicular cancer.  Use sunscreen. Apply sunscreen liberally and repeatedly throughout the Smoker. You should seek shade when your shadow is shorter than you. Protect yourself by wearing long sleeves, pants, a wide-brimmed hat, and sunglasses year round, whenever you are outdoors.  Once a month, do a whole-body skin exam, using a mirror to look at the skin on your back. Tell your health care provider about new moles, moles that have irregular borders, moles that are larger than a pencil eraser, or moles that have changed in shape or color.  Stay current with required vaccines  (immunizations).  Influenza vaccine. All adults should be immunized every year.  Tetanus, diphtheria, and acellular pertussis (Td, Tdap) vaccine. An adult who has not previously received Tdap or who does not know his vaccine status should receive 1 dose of Tdap. This initial dose should be followed by tetanus and diphtheria toxoids (Td) booster doses every 10 years. Adults with an   unknown or incomplete history of completing a 3-dose immunization series with Td-containing vaccines should begin or complete a primary immunization series including a Tdap dose. Adults should receive a Td booster every 10 years.  Varicella vaccine. An adult without evidence of immunity to varicella should receive 2 doses or a second dose if he has previously received 1 dose.  Human papillomavirus (HPV) vaccine. Males aged 13-21 years who have not received the vaccine previously should receive the 3-dose series. Males aged 22-26 years may be immunized. Immunization is recommended through the age of 26 years for any male who has sex with males and did not get any or all doses earlier. Immunization is recommended for any person with an immunocompromised condition through the age of 26 years if he did not get any or all doses earlier. During the 3-dose series, the second dose should be obtained 4-8 weeks after the first dose. The third dose should be obtained 24 weeks after the first dose and 16 weeks after the second dose.  Zoster vaccine. One dose is recommended for adults aged 60 years or older unless certain conditions are present.    PREVNAR  - Pneumococcal 13-valent conjugate (PCV13) vaccine. When indicated, a person who is uncertain of his immunization history and has no record of immunization should receive the PCV13 vaccine. An adult aged 19 years or older who has certain medical conditions and has not been previously immunized should receive 1 dose of PCV13 vaccine. This PCV13 should be followed with a dose of  pneumococcal polysaccharide (PPSV23) vaccine. The PPSV23 vaccine dose should be obtained at least 1 r more year(s) after the dose of PCV13 vaccine. An adult aged 19 years or older who has certain medical conditions and previously received 1 or more doses of PPSV23 vaccine should receive 1 dose of PCV13. The PCV13 vaccine dose should be obtained 1 or more years after the last PPSV23 vaccine dose.    PNEUMOVAX - Pneumococcal polysaccharide (PPSV23) vaccine. When PCV13 is also indicated, PCV13 should be obtained first. All adults aged 65 years and older should be immunized. An adult younger than age 65 years who has certain medical conditions should be immunized. Any person who resides in a nursing home or long-term care facility should be immunized. An adult smoker should be immunized. People with an immunocompromised condition and certain other conditions should receive both PCV13 and PPSV23 vaccines. People with human immunodeficiency virus (HIV) infection should be immunized as soon as possible after diagnosis. Immunization during chemotherapy or radiation therapy should be avoided. Routine use of PPSV23 vaccine is not recommended for American Indians, [place] Natives, or people younger than 65 years unless there are medical conditions that require PPSV23 vaccine. When indicated, people who have unknown immunization and have no record of immunization should receive PPSV23 vaccine. One-time revaccination 5 years after the first dose of PPSV23 is recommended for people aged 19-64 years who have chronic kidney failure, nephrotic syndrome, asplenia, or immunocompromised conditions. People who received 1-2 doses of PPSV23 before age 65 years should receive another dose of PPSV23 vaccine at age 65 years or later if at least 5 years have passed since the previous dose. Doses of PPSV23 are not needed for people immunized with PPSV23 at or after age 65 years.    Hepatitis A vaccine. Adults who wish to be protected  from this disease, have certain high-risk conditions, work with hepatitis A-infected animals, work in hepatitis A research labs, or travel to or work in countries with   a high rate of hepatitis A should be immunized. Adults who were previously unvaccinated and who anticipate close contact with an international adoptee during the first 60 days after arrival in the [place] from a country with a high rate of hepatitis A should be immunized.    Hepatitis B vaccine. Adults should be immunized if they wish to be protected from this disease, have certain high-risk conditions, may be exposed to blood or other infectious body fluids, are household contacts or sex partners of hepatitis B positive people, are clients or workers in certain care facilities, or travel to or work in countries with a high rate of hepatitis B.   Preventive Service / Frequency   Ages 40 to 64  Blood pressure check.  Lipid and cholesterol check  Lung cancer screening. / Every year if you are aged 55-80 years and have a 30-pack-year history of smoking and currently smoke or have quit within the past 15 years. Yearly screening is stopped once you have quit smoking for at least 15 years or develop a health problem that would prevent you from having lung cancer treatment.  Fecal occult blood test (FOBT) of stool. / Every year beginning at age 50 and continuing until age 75. You may not have to do this test if you get a colonoscopy every 10 years.  Flexible sigmoidoscopy** or colonoscopy.** / Every 5 years for a flexible sigmoidoscopy or every 10 years for a colonoscopy beginning at age 50 and continuing until age 75. Screening for abdominal aortic aneurysm (AAA)  by ultrasound is recommended for people who have history of high blood pressure or who are current or former smokers. +++++++++++ Recommend Adult Low Dose Aspirin or  coated  Aspirin 81 mg daily  To reduce risk of Colon Cancer 40 %,  Skin Cancer 26 % ,  Malignant  Melanoma 46%  and  Pancreatic cancer 60% ++++++++++++++++++++ Vitamin D goal  is between 70-100.  Please make sure that you are taking your Vitamin D as directed.  It is very important as a natural anti-inflammatory  helping hair, skin, and nails, as well as reducing stroke and heart attack risk.  It helps your bones and helps with mood. It also decreases numerous cancer risks so please take it as directed.  Low Vit D is associated with a 200-300% higher risk for CANCER  and 200-300% higher risk for HEART   ATTACK  &  STROKE.   ...................................... It is also associated with higher death rate at younger ages,  autoimmune diseases like Rheumatoid arthritis, Lupus, Multiple Sclerosis.    Also many other serious conditions, like depression, Alzheimer's Dementia, infertility, muscle aches, fatigue, fibromyalgia - just to name a few. +++++++++++++++++++++ Recommend the book "The END of DIETING" by Dr Joel Fuhrman  & the book "The END of DIABETES " by Dr Joel Fuhrman At [employer].com - get book & Audio CD's    Being diabetic has a  300% increased risk for heart attack, stroke, cancer, and alzheimer- type vascular dementia. It is very important that you work harder with diet by avoiding all foods that are white. Avoid white rice (brown & wild rice is OK), white potatoes (sweetpotatoes in moderation is OK), White bread or wheat bread or anything made out of white flour like bagels, donuts, rolls, buns, biscuits, cakes, pastries, cookies, pizza crust, and pasta (made from white flour & egg whites) - vegetarian pasta or spinach or wheat pasta is OK. Multigrain breads like Arnold's or Pepperidge Farm,   or multigrain sandwich thins or flatbreads.  Diet, exercise and weight loss can reverse and cure diabetes in the early stages.  Diet, exercise and weight loss is very important in the control and prevention of complications of diabetes which affects every system in your body, ie. Brain -  dementia/stroke, eyes - glaucoma/blindness, heart - heart attack/heart failure, kidneys - dialysis, stomach - gastric paralysis, intestines - malabsorption, nerves - severe painful neuritis, circulation - gangrene & loss of a leg(s), and finally cancer and Alzheimers.    I recommend avoid fried & greasy foods,  sweets/candy, white rice (brown or wild rice or Quinoa is OK), white potatoes (sweet potatoes are OK) - anything made from white flour - bagels, doughnuts, rolls, buns, biscuits,white and wheat breads, pizza crust and traditional pasta made of white flour & egg white(vegetarian pasta or spinach or wheat pasta is OK).  Multi-grain bread is OK - like multi-grain flat bread or sandwich thins. Avoid alcohol in excess. Exercise is also important.    Eat all the vegetables you want - avoid meat, especially red meat and dairy - especially cheese.  Cheese is the most concentrated form of trans-fats which is the worst thing to clog up our arteries. Veggie cheese is OK which can be found in the fresh produce section at [employer] or [employer] or [employer]  ++++++++++++++++++++++ DASH Eating Plan  DASH stands for "Dietary Approaches to Stop Hypertension."   The DASH eating plan is a healthy eating plan that has been shown to reduce high blood pressure (hypertension). Additional health benefits may include reducing the risk of type 2 diabetes mellitus, heart disease, and stroke. The DASH eating plan may also help with weight loss. WHAT DO I NEED TO KNOW ABOUT THE DASH EATING PLAN? For the DASH eating plan, you will follow these general guidelines:  Choose foods with a percent daily value for sodium of less than 5% (as listed on the food label).  Use salt-free seasonings or herbs instead of table salt or sea salt.  Check with your health care provider or pharmacist before using salt substitutes.  Eat lower-sodium products, often labeled as "lower sodium" or "no salt added."  Eat fresh  foods.  Eat more vegetables, fruits, and low-fat dairy products.  Choose whole grains. Look for the word "whole" as the first word in the ingredient list.  Choose fish   Limit sweets, desserts, sugars, and sugary drinks.  Choose heart-healthy fats.  Eat veggie cheese   Eat more home-cooked food and less restaurant, buffet, and fast food.  Limit fried foods.  Cook foods using methods other than frying.  Limit canned vegetables. If you do use them, rinse them well to decrease the sodium.  When eating at a restaurant, ask that your food be prepared with less salt, or no salt if possible.                      WHAT FOODS CAN I EAT? Read Dr Joel Fuhrman's books on The End of Dieting & The End of Diabetes  Grains Whole grain or whole wheat bread. Brown rice. Whole grain or whole wheat pasta. Quinoa, bulgur, and whole grain cereals. Low-sodium cereals. Corn or whole wheat flour tortillas. Whole grain cornbread. Whole grain crackers. Low-sodium crackers.  Vegetables Fresh or frozen vegetables (raw, steamed, roasted, or grilled). Low-sodium or reduced-sodium tomato and vegetable juices. Low-sodium or reduced-sodium tomato sauce and paste. Low-sodium or reduced-sodium canned vegetables.   Fruits All fresh, canned (  in natural juice), or frozen fruits.  Protein Products  All fish and seafood.  Dried beans, peas, or lentils. Unsalted nuts and seeds. Unsalted canned beans.  Dairy Low-fat dairy products, such as skim or 1% milk, 2% or reduced-fat cheeses, low-fat ricotta or cottage cheese, or plain low-fat yogurt. Low-sodium or reduced-sodium cheeses.  Fats and Oils Tub margarines without trans fats. Light or reduced-fat mayonnaise and salad dressings (reduced sodium). Avocado. Safflower, olive, or canola oils. Natural peanut or almond butter.  Other Unsalted popcorn and pretzels. The items listed above may not be a complete list of recommended foods or beverages. Contact your  dietitian for more options.  +++++++++++++++++++  WHAT FOODS ARE NOT RECOMMENDED? Grains/ White flour or wheat flour White bread. White pasta. White rice. Refined cornbread. Bagels and croissants. Crackers that contain trans fat.  Vegetables  Creamed or fried vegetables. Vegetables in a . Regular canned vegetables. Regular canned tomato sauce and paste. Regular tomato and vegetable juices.  Fruits Dried fruits. Canned fruit in light or heavy syrup. Fruit juice.  Meat and Other Protein Products Meat in general - RED meat & White meat.  Fatty cuts of meat. Ribs, chicken wings, all processed meats as bacon, sausage, bologna, salami, fatback, hot dogs, bratwurst and packaged luncheon meats.  Dairy Whole or 2% milk, cream, half-and-half, and cream cheese. Whole-fat or sweetened yogurt. Full-fat cheeses or blue cheese. Non-dairy creamers and whipped toppings. Processed cheese, cheese spreads, or cheese curds.  Condiments Onion and garlic salt, seasoned salt, table salt, and sea salt. Canned and packaged gravies. Worcestershire sauce. Tartar sauce. Barbecue sauce. Teriyaki sauce. Soy sauce, including reduced sodium. Steak sauce. Fish sauce. Oyster sauce. Cocktail sauce. Horseradish. Ketchup and mustard. Meat flavorings and tenderizers. Bouillon cubes. Hot sauce. Tabasco sauce. Marinades. Taco seasonings. Relishes.  Fats and Oils Butter, stick margarine, lard, shortening and bacon fat. Coconut, palm kernel, or palm oils. Regular salad dressings.  Pickles and olives. Salted popcorn and pretzels.  The items listed above may not be a complete list of foods and beverages to avoid.    

## 2019-09-30 LAB — URINALYSIS, ROUTINE W REFLEX MICROSCOPIC
Bilirubin Urine: NEGATIVE
Glucose, UA: NEGATIVE
Hgb urine dipstick: NEGATIVE
Ketones, ur: NEGATIVE
Leukocytes,Ua: NEGATIVE
Nitrite: NEGATIVE
Protein, ur: NEGATIVE
Specific Gravity, Urine: 1.014 (ref 1.001–1.03)
pH: 5.5 (ref 5.0–8.0)

## 2019-09-30 LAB — IRON,?TOTAL/TOTAL IRON BINDING CAP: %SAT: 21 % (calc) (ref 20–48)

## 2019-09-30 LAB — CBC WITH DIFFERENTIAL/PLATELET
Absolute Monocytes: 390 cells/uL (ref 200–950)
Basophils Absolute: 88 cells/uL (ref 0–200)
Basophils Relative: 1.7 %
Eosinophils Absolute: 369 cells/uL (ref 15–500)
Eosinophils Relative: 7.1 %
HCT: 36.5 % — ABNORMAL LOW (ref 38.5–50.0)
Hemoglobin: 12.5 g/dL — ABNORMAL LOW (ref 13.2–17.1)
Lymphs Abs: 2475 cells/uL (ref 850–3900)
MCH: 31.6 pg (ref 27.0–33.0)
MCHC: 34.2 g/dL (ref 32.0–36.0)
MCV: 92.2 fL (ref 80.0–100.0)
MPV: 9.5 fL (ref 7.5–12.5)
Monocytes Relative: 7.5 %
Neutro Abs: 1877 cells/uL (ref 1500–7800)
Neutrophils Relative %: 36.1 %
Platelets: 312 10*3/uL (ref 140–400)
RBC: 3.96 10*6/uL — ABNORMAL LOW (ref 4.20–5.80)
RDW: 13.2 % (ref 11.0–15.0)
Total Lymphocyte: 47.6 %
WBC: 5.2 10*3/uL (ref 3.8–10.8)

## 2019-09-30 LAB — HEMOGLOBIN A1C
Hgb A1c MFr Bld: 5.9 % of total Hgb — ABNORMAL HIGH (ref <5.7)
Mean Plasma Glucose: 123 (calc)
eAG (mmol/L): 6.8 (calc)

## 2019-09-30 LAB — MICROALBUMIN / CREATININE URINE RATIO
Creatinine, Urine: 145 mg/dL (ref 20–320)
Microalb Creat Ratio: 12 mcg/mg creat (ref <30)
Microalb, Ur: 1.7 mg/dL

## 2019-09-30 LAB — LIPID PANEL
Cholesterol: 157 mg/dL (ref <200)
HDL: 50 mg/dL (ref >=40)
LDL Cholesterol (Calc): 82 mg/dL (calc)
Non-HDL Cholesterol (Calc): 107 mg/dL (calc) (ref <130)
Total CHOL/HDL Ratio: 3.1 (calc) (ref <5.0)
Triglycerides: 156 mg/dL — ABNORMAL HIGH (ref <150)

## 2019-09-30 LAB — COMPLETE METABOLIC PANEL WITH GFR
AG Ratio: 1.4 (calc) (ref 1.0–2.5)
ALT: 14 U/L (ref 9–46)
AST: 18 U/L (ref 10–35)
Albumin: 4.1 g/dL (ref 3.6–5.1)
Alkaline phosphatase (APISO): 62 U/L (ref 35–144)
BUN: 8 mg/dL (ref 7–25)
CO2: 30 mmol/L (ref 20–32)
Calcium: 9.6 mg/dL (ref 8.6–10.3)
Chloride: 102 mmol/L (ref 98–110)
Creat: 1.03 mg/dL (ref 0.70–1.25)
GFR, Est African American: 89 mL/min/{1.73_m2} (ref >=60)
GFR, Est Non African American: 77 mL/min/{1.73_m2} (ref >=60)
Globulin: 2.9 g/dL (calc) (ref 1.9–3.7)
Glucose, Bld: 99 mg/dL (ref 65–99)
Potassium: 4.1 mmol/L (ref 3.5–5.3)
Sodium: 139 mmol/L (ref 135–146)
Total Bilirubin: 0.3 mg/dL (ref 0.2–1.2)
Total Protein: 7 g/dL (ref 6.1–8.1)

## 2019-09-30 LAB — INSULIN, RANDOM: Insulin: 6.6 u[IU]/mL

## 2019-09-30 LAB — PSA: PSA: 0.3 ng/mL (ref <=4.0)

## 2019-09-30 LAB — MAGNESIUM: Magnesium: 2.4 mg/dL (ref 1.5–2.5)

## 2019-09-30 LAB — VITAMIN D 25 HYDROXY (VIT D DEFICIENCY, FRACTURES): Vit D, 25-Hydroxy: 41 ng/mL (ref 30–100)

## 2019-09-30 LAB — TESTOSTERONE: Testosterone: 340 ng/dL (ref 250–827)

## 2019-09-30 LAB — VITAMIN B12: Vitamin B-12: 428 pg/mL (ref 200–1100)

## 2019-09-30 LAB — IRON, TOTAL/TOTAL IRON BINDING CAP
Iron: 65 ug/dL (ref 50–180)
TIBC: 306 mcg/dL (calc) (ref 250–425)

## 2019-09-30 LAB — TSH: TSH: 1.24 mIU/L (ref 0.40–4.50)

## 2019-10-03 ENCOUNTER — Encounter: Payer: Self-pay | Admitting: Internal Medicine

## 2019-10-04 ENCOUNTER — Encounter: Payer: Self-pay | Admitting: *Deleted

## 2019-10-04 LAB — TB SKIN TEST
Induration: 0 mm
TB Skin Test: NEGATIVE

## 2019-11-25 ENCOUNTER — Encounter: Payer: Self-pay | Admitting: Internal Medicine

## 2020-01-02 NOTE — Progress Notes (Signed)
[facility] LAB[facility] THIS TIME, WILL GET AT FOLLOW UP  Assessment and Plan:  Hyperlipidemia check lipids decrease fatty foods increase activity.   Labile hypertension - continue medications, DASH diet, exercise and monitor at home. Call if greater than 130/80.   Tobacco use disorder -     varenicline (CHANTIX CONTINUING MONTH PAK) 1 MG tablet; Take 1 tablet (1 mg total) by mouth 2 (two) times daily. Smoking cessation-  instruction/counseling given, counseled patient on the dangers of tobacco use, advised patient to stop smoking, and reviewed strategies to maximize success     Continue diet and meds as discussed. Further disposition pending results of labs. Over 30 minutes of exam, counseling, chart review, and critical decision making was performed Future Appointments  Date Time Provider [place]  04/10/2020  4:00 PM Unk Pinto, MD [facility] None  09/21/2020 11:00 AM Unk Pinto, MD [facility] None    HPI [name]  presents for 3 month follow up on hypertension, cholesterol, prediabetes, and vitamin D deficiency.    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  He denies chest pain, shortness of breath, dizziness. BMI is Body mass index is 25.32 kg/m., he is working on diet and exercise. Wt Readings from Last 3 Encounters:  01/03/20 179 lb (81.2 kg)  09/29/19 176 lb 12.8 oz (80.2 kg)  05/10/19 180 lb 6.4 oz (81.8 kg)   Has started back to smoking, 1 pack last 3 days, will use nicotine patches and would like chantix to quit.    He is on cholesterol medication and denies myalgias. His cholesterol is at goal. The cholesterol last visit was:   Lab Results  Component Value Date   CHOL 157 09/29/2019   HDL 50 09/29/2019   LDLCALC 82 09/29/2019   TRIG 156 (H) 09/29/2019   CHOLHDL 3.1 09/29/2019    He has been working on diet and exercise for  prediabetes, and denies paresthesia of the feet, polydipsia, polyuria and visual disturbances. Last A1C in the office was:  Lab Results  Component Value Date   HGBA1C 5.9 (H) 09/29/2019   Patient is on Vitamin D supplement.   Lab Results  Component Value Date   VD25OH 41 09/29/2019      Current Medications:  Current Outpatient Medications on File Prior to Visit  Medication Sig Dispense Refill  . acyclovir (ZOVIRAX) 400 MG tablet Take 1 tablet 2 x /Bachar to prevent fever blisters 180 tablet 3  . Ascorbic Acid (VITAMIN C PO) Take 1 tablet by mouth daily.    Marland Kitchen atorvastatin (LIPITOR) 80 MG tablet TAKE ONE TABLET BY MOUTH DAILY 90 tablet 0  . Cholecalciferol (VITAMIN D PO) Take 2,000 Units by mouth daily.     Marland Kitchen OVER THE COUNTER MEDICATION Takes an OTC iron tablet daily.    . sildenafil (REVATIO) 20 MG tablet TAKE 1 TO 5 TABLETS BY MOUTH ONCE DAILY AS NEEDED 90 tablet 1   No current facility-administered medications on file prior to visit.   Medical History:  Past Medical History:  Diagnosis Date  . Labile hypertension   . Osteoarthritis   . Other testicular hypofunction   . Prediabetes    Allergies: Not on File   Review of Systems:  Review of Systems  Constitutional: Negative.  Negative for malaise/fatigue and weight loss.  HENT: Negative for congestion, ear discharge, ear pain,  hearing loss, nosebleeds, sore throat and tinnitus.   Eyes: Negative.   Respiratory: Negative.  Negative for stridor.   Cardiovascular: Negative.   Gastrointestinal: Negative for abdominal pain, blood in stool, constipation, diarrhea, heartburn, melena, nausea and vomiting.  Genitourinary: Negative.   Musculoskeletal: Negative for joint pain.  Skin: Negative.        Bump on back  Neurological: Negative.  Negative for weakness and headaches.  Endo/Heme/Allergies: Negative.   Psychiatric/Behavioral: Negative.     Family history- Review and unchanged Social history- Review and unchanged Physical  Exam: BP 122/70   Pulse 74   Temp (!) 97.5 F (36.4 C)   Wt 179 lb (81.2 kg)   SpO2 98%   BMI 25.32 kg/m  Wt Readings from Last 3 Encounters:  01/03/20 179 lb (81.2 kg)  09/29/19 176 lb 12.8 oz (80.2 kg)  05/10/19 180 lb 6.4 oz (81.8 kg)   General Appearance: Well nourished, in no apparent distress. Eyes: PERRLA, EOMs, conjunctiva no swelling or erythema Sinuses: No Frontal/maxillary tenderness ENT/Mouth: Ext aud canals clear, TMs without erythema, bulging. No erythema, swelling, or exudate on post pharynx.  Tonsils not swollen or erythematous. Hearing normal.  Neck: Supple, thyroid normal.  Respiratory: Respiratory effort normal, BS equal bilaterally without rales, rhonchi, wheezing or stridor.  Cardio: RRR with no MRGs. Brisk peripheral pulses without edema.  Abdomen: Soft, + BS,  Non tender, no guarding, rebound, hernias, masses. Lymphatics: Non tender without lymphadenopathy.  Musculoskeletal: Full ROM, 5/5 strength,  Strength is normal and symmetric in arms. Skin: upper back right of midline with seb cyst 2 cm, no redness, no warmth, Warm, dry without rashes, lesions, ecchymosis.  Neuro: Cranial nerves intact. Normal muscle tone, no cerebellar symptoms. Psych: Awake and oriented X 3, normal affect, Insight and Judgment appropriate.       Vicie Mutters, PA-C 4:42 PM [facility]

## 2020-01-03 ENCOUNTER — Other Ambulatory Visit: Payer: Self-pay

## 2020-01-03 ENCOUNTER — Encounter: Payer: Self-pay | Admitting: Physician Assistant

## 2020-01-03 ENCOUNTER — Ambulatory Visit: Payer: Managed Care, Other (non HMO) | Admitting: Physician Assistant

## 2020-01-03 VITALS — BP 122/70 | HR 74 | Temp 97.5°F | Wt 179.0 lb

## 2020-01-03 DIAGNOSIS — R0989 Other specified symptoms and signs involving the circulatory and respiratory systems: Secondary | ICD-10-CM | POA: Diagnosis not present

## 2020-01-03 DIAGNOSIS — E559 Vitamin D deficiency, unspecified: Secondary | ICD-10-CM

## 2020-01-03 DIAGNOSIS — F17200 Nicotine dependence, unspecified, uncomplicated: Secondary | ICD-10-CM

## 2020-01-03 DIAGNOSIS — E782 Mixed hyperlipidemia: Secondary | ICD-10-CM | POA: Diagnosis not present

## 2020-01-03 DIAGNOSIS — Z79899 Other long term (current) drug therapy: Secondary | ICD-10-CM

## 2020-01-03 DIAGNOSIS — R7309 Other abnormal glucose: Secondary | ICD-10-CM | POA: Diagnosis not present

## 2020-01-03 MED ORDER — VARENICLINE TARTRATE 1 MG PO TABS: 1.0000 mg | ORAL_TABLET | Freq: Two times a day (BID) | ORAL | 2 refills | Status: DC

## 2020-01-03 NOTE — Patient Instructions (Addendum)
[place]  [employer]  [phone] for more information or for a free program for smoking cessation help.   You can call QUIT SMART 1-800-QUIT-NOW for free nicotine patches or replacement therapy- if they are out- keep calling  [place] [facility] Can call for smoking cessation classes, [phone]  If you have a smart phone, please look up Smoke Free app, this will help you stay on track and give you information about money you have saved, life that you have gained back and a ton of more information.   We are giving you chantix for smoking cessation. You can do it! And we are here to help! You may have heard some scary side effects about chantix, the three most common I hear about are nausea, crazy dreams and depression.  However, I like for my patients to try to stay on 1/2 a tablet twice a Sainz rather than one tablet twice a Goza as normally prescribed. This helps decrease the chances of side effects and helps save money by making a one month prescription last two months  Please start the prescription this way:  Start 1/2 tablet by mouth once daily after food with a full glass of water for 3 days Then do 1/2 tablet by mouth twice daily for 4 days. During this first week you can smoke, but try to stop after this week.  At this point we have several options: 1) continue on 1/2 tablet twice a Joh[place]- which I encourage you to do. You can stay on this dose the rest of the time on the medication or if you still feel the need to smoke you can do one of the two options below. 2) do one tablet in the morning and 1/2 in the evening which helps decrease dreams. 3) do one tablet twice a Wade.   What if I miss a dose? If you miss a dose, take it as soon as you can. If it is almost time for your next dose, take only that dose. Do not take double or extra doses.  What should I watch for while using this medicine? Visit your doctor or health care professional for  regular check ups. Ask for ongoing advice and encouragement from your doctor or healthcare professional, friends, and family to help you quit. If you smoke while on this medication, quit again  Your mouth may get dry. Chewing sugarless gum or hard candy, and drinking plenty of water may help. Contact your doctor if the problem does not go away or is severe.  You may get drowsy or dizzy. Do not drive, use machinery, or do anything that needs mental alertness until you know how this medicine affects you. Do not stand or sit up quickly, especially if you are an older patient.   The use of this medicine may increase the chance of suicidal thoughts or actions. Pay special attention to how you are responding while on this medicine. Any worsening of mood, or thoughts of suicide or dying should be reported to your health care professional right away.  ADVANTAGES OF QUITTING SMOKING  Within 20 minutes, blood pressure decreases. Your pulse is at normal level.  After 8 hours, carbon monoxide levels in the blood return to normal. Your oxygen level increases.  After 24 hours, the chance of having a heart attack starts to decrease. Your breath, hair, and body stop smelling like smoke.  After 48 hours, damaged nerve endings begin to recover. Your sense of taste and  smell improve.  After 72 hours, the body is virtually free of nicotine. Your bronchial tubes relax and breathing becomes easier.  After 2 to 12 weeks, lungs can hold more air. Exercise becomes easier and circulation improves.  After 1 year, the risk of coronary heart disease is cut in half.  After 5 years, the risk of stroke falls to the same as a nonsmoker.  After 10 years, the risk of lung cancer is cut in half and the risk of other cancers decreases significantly.  After 15 years, the risk of coronary heart disease drops, usually to the level of a nonsmoker.  You will have extra money to spend on things other than  cigarettes.  Epidermal Cyst  An epidermal cyst is a sac made of skin tissue. The sac contains a substance called keratin. Keratin is a protein that is normally secreted through the hair follicles. When keratin becomes trapped in the top layer of skin (epidermis), it can form an epidermal cyst. Epidermal cysts can be found anywhere on your body. These cysts are usually harmless (benign), and they may not cause symptoms unless they become infected. What are the causes? This condition may be caused by:  A blocked hair follicle.  A hair that curls and re-enters the skin instead of growing straight out of the skin (ingrown hair).  A blocked pore.  Irritated skin.  An injury to the skin.  Certain conditions that are passed along from parent to child (inherited).  Human papillomavirus (HPV).  Long-term (chronic) sun damage to the skin. What increases the risk? The following factors may make you more likely to develop an epidermal cyst:  Having acne.  Being overweight.  Being 34-15 years old. What are the signs or symptoms? The only symptom of this condition may be a small, painless lump underneath the skin. When an epidermal cyst ruptures, it may become infected. Symptoms may include:  Redness.  Inflammation.  Tenderness.  Warmth.  Fever.  Keratin draining from the cyst. Keratin is grayish-white, bad-smelling substance.  Pus draining from the cyst. How is this diagnosed? This condition is diagnosed with a physical exam.  In some cases, you may have a sample of tissue (biopsy) taken from your cyst to be examined under a microscope or tested for bacteria.  You may be referred to a health care provider who specializes in skin care (dermatologist). How is this treated? In many cases, epidermal cysts go away on their own without treatment. If a cyst becomes infected, treatment may include:  Opening and draining the cyst, done by a health care provider. After draining,  minor surgery to remove the rest of the cyst may be done.  Antibiotic medicine.  Injections of medicines (steroids) that help to reduce inflammation.  Surgery to remove the cyst. Surgery may be done if the cyst: ? Becomes large. ? Bothers you. ? Has a chance of turning into cancer.  Do not try to open a cyst yourself. Follow these instructions at home:  Take over-the-counter and prescription medicines only as told by your health care provider.  If you were prescribed an antibiotic medicine, take it it as told by your health care provider. Do not stop using the antibiotic even if you start to feel better.  Keep the area around your cyst clean and dry.  Wear loose, dry clothing.  Avoid touching your cyst.  Check your cyst every Weipert for signs of infection. Check for: ? Redness, swelling, or pain. ? Fluid or blood. ?  Warmth. ? Pus or a bad smell.  Keep all follow-up visits as told by your health care provider. This is important. How is this prevented?  Wear clean, dry, clothing.  Avoid wearing tight clothing.  Keep your skin clean and dry. Take showers or baths every Shimon. Contact a health care provider if:  Your cyst develops symptoms of infection.  Your condition is not improving or is getting worse.  You develop a cyst that looks different from other cysts you have had.  You have a fever. Get help right away if:  Redness spreads from the cyst into the surrounding area. Summary  An epidermal cyst is a sac made of skin tissue. These cysts are usually harmless (benign), and they may not cause symptoms unless they become infected.  If a cyst becomes infected, treatment may include surgery to open and drain the cyst, or to remove it. Treatment may also include medicines by mouth or through an injection.  Take over-the-counter and prescription medicines only as told by your health care provider. If you were prescribed an antibiotic medicine, take it as told by your  health care provider. Do not stop using the antibiotic even if you start to feel better.  Contact a health care provider if your condition is not improving or is getting worse.  Keep all follow-up visits as told by your health care provider. This is important. This information is not intended to replace advice given to you by your health care provider. Make sure you discuss any questions you have with your health care provider. Document Revised: 02/25/2019 Document Reviewed: 05/18/2018 Elsevier Patient Education  [place].

## 2020-03-09 ENCOUNTER — Other Ambulatory Visit: Payer: Self-pay | Admitting: Internal Medicine

## 2020-03-16 ENCOUNTER — Other Ambulatory Visit: Payer: Self-pay | Admitting: Internal Medicine

## 2020-04-09 ENCOUNTER — Encounter: Payer: Self-pay | Admitting: Internal Medicine

## 2020-04-09 NOTE — Patient Instructions (Signed)

## 2020-04-09 NOTE — Progress Notes (Signed)
History of Present Illness:       This very nice 64 y.o. MBM  presents for 6 month follow up with HTN, HLD, Pre-Diabetes and Vitamin D Deficiency. Patient has GERD controlled on his meds.       Patient is followed expectantly for Labile  HTN circa 1995 & BP has been controlled at home. Today's  . Patient has had no complaints of any cardiac type chest pain, palpitations, dyspnea / orthopnea / PND, dizziness, claudication, or dependent edema.      Hyperlipidemia is controlled with diet & Atorvastatin. Patient denies myalgias or other med SE's. Last Lipids were at goal except elevated Trig's:  Lab Results  Component Value Date   CHOL 157 09/29/2019   HDL 50 09/29/2019   LDLCALC 82 09/29/2019   TRIG 156 (H) 09/29/2019   CHOLHDL 3.1 09/29/2019    Also, the patient has history of PreDiabetes (A1c 6.0% / 2012&6.1% / 2015)  and has had no symptoms of reactive hypoglycemia, diabetic polys, paresthesias or visual blurring.  Last A1c was not at goal:  Lab Results  Component Value Date   HGBA1C 5.9 (H) 09/29/2019           Further, the patient also has history of Vitamin D Deficiency and supplements vitamin D without any suspected side-effects. Last vitamin D was still low (goal is 70-100):  Lab Results  Component Value Date   VD25OH 41 09/29/2019    Current Outpatient Medications on File Prior to Visit  Medication Sig  . acyclovir (ZOVIRAX) 400 MG tablet Take 1 tablet 2 x /Boquet to prevent fever blisters  . Ascorbic Acid (VITAMIN C PO) Take 1 tablet by mouth daily.  Marland Kitchen atorvastatin (LIPITOR) 80 MG tablet Take 1 tablet Daily for Cholesterol  . Cholecalciferol (VITAMIN D PO) Take 2,000 Units by mouth daily.   Marland Kitchen OVER THE COUNTER MEDICATION Takes an OTC iron tablet daily.  . sildenafil (REVATIO) 20 MG tablet Take 1 to 5 tablets  Daily if needed for XXXX  . varenicline (CHANTIX CONTINUING MONTH PAK) 1 MG tablet Take 1 tablet (1 mg total) by mouth 2 (two) times daily.   No current  facility-administered medications on file prior to visit.   PMHx:   Past Medical History:  Diagnosis Date  . Labile hypertension   . Osteoarthritis   . Other testicular hypofunction   . Prediabetes     Immunization History  Administered Date(s) Administered  . Influenza Inj Mdck Quad With Preservative 09/29/2019  . PPD Test 06/12/2017, 08/24/2018, 09/29/2019  . Td 11/18/2004  . Tdap 12/25/2015    Past Surgical History:  Procedure Laterality Date  . HERNIA REPAIR    . THULIUM LASER TURP (TRANSURETHRAL RESECTION OF PROSTATE) N/A 01/05/2019   Procedure: THULIUM LASER TURP (TRANSURETHRAL RESECTION OF PROSTATE);  Surgeon: Festus Aloe, MD;  Location: [facility];  Service: Urology;  Laterality: N/A;    FHx:    Reviewed / unchanged  SHx:    Reviewed / unchanged   Systems Review:  Constitutional: Denies fever, chills, wt changes, headaches, insomnia, fatigue, night sweats, change in appetite. Eyes: Denies redness, blurred vision, diplopia, discharge, itchy, watery eyes.  ENT: Denies discharge, congestion, post nasal drip, epistaxis, sore throat, earache, hearing loss, dental pain, tinnitus, vertigo, sinus pain, snoring.  CV: Denies chest pain, palpitations, irregular heartbeat, syncope, dyspnea, diaphoresis, orthopnea, PND, claudication or edema. Respiratory: denies cough, dyspnea, DOE, pleurisy, hoarseness, laryngitis, wheezing.  Gastrointestinal: Denies dysphagia, odynophagia,  heartburn, reflux, water brash, abdominal pain or cramps, nausea, vomiting, bloating, diarrhea, constipation, hematemesis, melena, hematochezia  or hemorrhoids. Genitourinary: Denies dysuria, frequency, urgency, nocturia, hesitancy, discharge, hematuria or flank pain. Musculoskeletal: Denies arthralgias, myalgias, stiffness, jt. swelling, pain, limping or strain/sprain.  Skin: Denies pruritus, rash, hives, warts, acne, eczema or change in skin lesion(s). Neuro: No weakness, tremor,  incoordination, spasms, paresthesia or pain. Psychiatric: Denies confusion, memory loss or sensory loss. Endo: Denies change in weight, skin or hair change.  Heme/Lymph: No excessive bleeding, bruising or enlarged lymph nodes.  Physical Exam  There were no vitals taken for this visit.  Appears  well nourished, well groomed  and in no distress.  Eyes: PERRLA, EOMs, conjunctiva no swelling or erythema. Sinuses: No frontal/maxillary tenderness ENT/Mouth: EAC's clear, TM's nl w/o erythema, bulging. Nares clear w/o erythema, swelling, exudates. Oropharynx clear without erythema or exudates. Oral hygiene is good. Tongue normal, non obstructing. Hearing intact.  Neck: Supple. Thyroid not palpable. Car 2+/2+ without bruits, nodes or JVD. Chest: Respirations nl with BS clear & equal w/o rales, rhonchi, wheezing or stridor.  Cor: Heart sounds normal w/ regular rate and rhythm without sig. murmurs, gallops, clicks or rubs. Peripheral pulses normal and equal  without edema.  Abdomen: Soft & bowel sounds normal. Non-tender w/o guarding, rebound, hernias, masses or organomegaly.  Lymphatics: Unremarkable.  Musculoskeletal: Full ROM all peripheral extremities, joint stability, 5/5 strength and normal gait.  Skin: Warm, dry without exposed rashes, lesions or ecchymosis apparent.  Neuro: Cranial nerves intact, reflexes equal bilaterally. Sensory-motor testing grossly intact. Tendon reflexes grossly intact.  Pysch: Alert & oriented x 3.  Insight and judgement nl & appropriate. No ideations.  Assessment and Plan:  1. Labile hypertension  - Continue medication, monitor blood pressure at home.  - Continue DASH diet.  Reminder to go to the ER if any CP,  SOB, nausea, dizziness, severe HA, changes vision/speech.  - CBC with Differential/Platelet - COMPLETE METABOLIC PANEL WITH GFR - Magnesium - TSH  2. Hyperlipidemia, mixed  - Continue diet/meds, exercise,& lifestyle modifications.  - Continue  monitor periodic cholesterol/liver & renal functions   - Lipid panel - TSH  3. Abnormal glucose  - Continue diet, exercise  - Lifestyle modifications.  - Monitor appropriate labs.  - Hemoglobin A1c - Insulin, random  4. Vitamin D deficiency  - Continue supplementation.  - VITAMIN D 25 Hydroxy  5. Prediabetes  - Hemoglobin A1c - Insulin, random  6. Medication management  - CBC with Differential/Platelet - COMPLETE METABOLIC PANEL WITH GFR - Magnesium - Lipid panel - TSH - Hemoglobin A1c - Insulin, random - VITAMIN D 25 Hydroxy         Discussed  regular exercise, BP monitoring, weight control to achieve/maintain BMI less than 25 and discussed med and SE's. Recommended labs to assess and monitor clinical status with further disposition pending results of labs.  I discussed the assessment and treatment plan with the patient. The patient was provided an opportunity to ask questions and all were answered. The patient agreed with the plan and demonstrated an understanding of the instructions.  I provided over 30 minutes of exam, counseling, chart review and  complex critical decision making.         The patient was advised to call back or seek an in-person evaluation if the symptoms worsen or if the condition fails to improve as anticipated.   Kirtland Bouchard, MD

## 2020-04-10 ENCOUNTER — Ambulatory Visit: Payer: Managed Care, Other (non HMO) | Admitting: Internal Medicine

## 2020-04-10 ENCOUNTER — Other Ambulatory Visit: Payer: Self-pay

## 2020-04-10 VITALS — BP 118/70 | HR 80 | Temp 97.0°F | Resp 16 | Ht 70.5 in | Wt 170.0 lb

## 2020-04-10 DIAGNOSIS — R7309 Other abnormal glucose: Secondary | ICD-10-CM

## 2020-04-10 DIAGNOSIS — Z79899 Other long term (current) drug therapy: Secondary | ICD-10-CM | POA: Diagnosis not present

## 2020-04-10 DIAGNOSIS — E782 Mixed hyperlipidemia: Secondary | ICD-10-CM | POA: Diagnosis not present

## 2020-04-10 DIAGNOSIS — E559 Vitamin D deficiency, unspecified: Secondary | ICD-10-CM | POA: Diagnosis not present

## 2020-04-10 DIAGNOSIS — R0989 Other specified symptoms and signs involving the circulatory and respiratory systems: Secondary | ICD-10-CM | POA: Diagnosis not present

## 2020-04-10 DIAGNOSIS — R7303 Prediabetes: Secondary | ICD-10-CM

## 2020-04-11 LAB — CBC WITH DIFFERENTIAL/PLATELET
Absolute Monocytes: 392 cells/uL (ref 200–950)
Basophils Absolute: 103 cells/uL (ref 0–200)
Basophils Relative: 2.1 %
Eosinophils Absolute: 221 cells/uL (ref 15–500)
Eosinophils Relative: 4.5 %
HCT: 36.3 % — ABNORMAL LOW (ref 38.5–50.0)
Hemoglobin: 12.5 g/dL — ABNORMAL LOW (ref 13.2–17.1)
Lymphs Abs: 2210 cells/uL (ref 850–3900)
MCH: 31.8 pg (ref 27.0–33.0)
MCHC: 34.4 g/dL (ref 32.0–36.0)
MCV: 92.4 fL (ref 80.0–100.0)
MPV: 9.6 fL (ref 7.5–12.5)
Monocytes Relative: 8 %
Neutro Abs: 1975 cells/uL (ref 1500–7800)
Neutrophils Relative %: 40.3 %
Platelets: 303 10*3/uL (ref 140–400)
RBC: 3.93 10*6/uL — ABNORMAL LOW (ref 4.20–5.80)
RDW: 13.4 % (ref 11.0–15.0)
Total Lymphocyte: 45.1 %
WBC: 4.9 10*3/uL (ref 3.8–10.8)

## 2020-04-11 LAB — LIPID PANEL
Cholesterol: 175 mg/dL (ref <200)
HDL: 60 mg/dL (ref >=40)
LDL Cholesterol (Calc): 84 mg/dL (calc)
Non-HDL Cholesterol (Calc): 115 mg/dL (calc) (ref <130)
Total CHOL/HDL Ratio: 2.9 (calc) (ref <5.0)
Triglycerides: 214 mg/dL — ABNORMAL HIGH (ref <150)

## 2020-04-11 LAB — HEMOGLOBIN A1C
Hgb A1c MFr Bld: 5.8 % of total Hgb — ABNORMAL HIGH (ref <5.7)
Mean Plasma Glucose: 120 (calc)
eAG (mmol/L): 6.6 (calc)

## 2020-04-11 LAB — MAGNESIUM: Magnesium: 2.4 mg/dL (ref 1.5–2.5)

## 2020-04-11 LAB — COMPLETE METABOLIC PANEL WITH GFR
AG Ratio: 1.4 (calc) (ref 1.0–2.5)
ALT: 18 U/L (ref 9–46)
AST: 19 U/L (ref 10–35)
Albumin: 4.1 g/dL (ref 3.6–5.1)
Alkaline phosphatase (APISO): 63 U/L (ref 35–144)
BUN: 11 mg/dL (ref 7–25)
CO2: 29 mmol/L (ref 20–32)
Calcium: 9.7 mg/dL (ref 8.6–10.3)
Chloride: 104 mmol/L (ref 98–110)
Creat: 1.11 mg/dL (ref 0.70–1.25)
GFR, Est African American: 81 mL/min/{1.73_m2} (ref >=60)
GFR, Est Non African American: 70 mL/min/{1.73_m2} (ref >=60)
Globulin: 2.9 g/dL (calc) (ref 1.9–3.7)
Glucose, Bld: 91 mg/dL (ref 65–99)
Potassium: 4.2 mmol/L (ref 3.5–5.3)
Sodium: 139 mmol/L (ref 135–146)
Total Bilirubin: 0.4 mg/dL (ref 0.2–1.2)
Total Protein: 7 g/dL (ref 6.1–8.1)

## 2020-04-11 LAB — INSULIN, RANDOM: Insulin: 4.4 u[IU]/mL

## 2020-04-11 LAB — VITAMIN D 25 HYDROXY (VIT D DEFICIENCY, FRACTURES): Vit D, 25-Hydroxy: 35 ng/mL (ref 30–100)

## 2020-04-11 LAB — TSH: TSH: 0.85 mIU/L (ref 0.40–4.50)

## 2020-04-20 ENCOUNTER — Encounter: Payer: Self-pay | Admitting: *Deleted

## 2020-05-16 ENCOUNTER — Other Ambulatory Visit: Payer: Self-pay | Admitting: Internal Medicine

## 2020-05-31 ENCOUNTER — Other Ambulatory Visit: Payer: Self-pay | Admitting: Internal Medicine

## 2020-05-31 MED ORDER — ACYCLOVIR 400 MG PO TABS: ORAL_TABLET | ORAL | 3 refills | Status: DC

## 2020-07-17 ENCOUNTER — Ambulatory Visit: Payer: Managed Care, Other (non HMO) | Admitting: Physician Assistant

## 2020-07-17 NOTE — Progress Notes (Deleted)
[facility] LAB[facility] THIS TIME, WILL GET AT FOLLOW UP  Assessment and Plan:  Hyperlipidemia check lipids decrease fatty foods increase activity.   Labile hypertension - continue medications, DASH diet, exercise and monitor at home. Call if greater than 130/80.   Tobacco use disorder -     varenicline (CHANTIX CONTINUING MONTH PAK) 1 MG tablet; Take 1 tablet (1 mg total) by mouth 2 (two) times daily. Smoking cessation-  instruction/counseling given, counseled patient on the dangers of tobacco use, advised patient to stop smoking, and reviewed strategies to maximize success     Continue diet and meds as discussed. Further disposition pending results of labs. Over 30 minutes of exam, counseling, chart review, and critical decision making was performed Future Appointments  Date Time Provider [place]  07/17/2020  4:00 PM Vicie Mutters, PA-C [facility] None  11/13/2020  2:00 PM Unk Pinto, MD [facility] None    HPI [name]  presents for 3 month follow up on hypertension, cholesterol, prediabetes, and vitamin D deficiency.    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  He denies chest pain, shortness of breath, dizziness. BMI is There is no height or weight on file to calculate BMI., he is working on diet and exercise. Wt Readings from Last 3 Encounters:  04/10/20 170 lb (77.1 kg)  01/03/20 179 lb (81.2 kg)  09/29/19 176 lb 12.8 oz (80.2 kg)   Has started back to smoking, 1 pack last 3 days, will use nicotine patches and would like chantix to quit.    He is on cholesterol medication and denies myalgias. His cholesterol is at goal. The cholesterol last visit was:   Lab Results  Component Value Date   CHOL 175 04/10/2020   HDL 60 04/10/2020   LDLCALC 84 04/10/2020   TRIG 214 (H) 04/10/2020   CHOLHDL 2.9 04/10/2020    He has been working on diet and exercise  for prediabetes, and denies paresthesia of the feet, polydipsia, polyuria and visual disturbances. Last A1C in the office was:  Lab Results  Component Value Date   HGBA1C 5.8 (H) 04/10/2020   Patient is on Vitamin D supplement.   Lab Results  Component Value Date   VD25OH 35 04/10/2020      Current Medications:  Current Outpatient Medications on File Prior to Visit  Medication Sig Dispense Refill  . acyclovir (ZOVIRAX) 400 MG tablet Take 1 tablet 2 x /[place] to prevent fever blisters 180 tablet 3  . Ascorbic Acid (VITAMIN C PO) Take 1 tablet by mouth daily.    Marland Kitchen atorvastatin (LIPITOR) 80 MG tablet Take 1 tablet Daily for Cholesterol 90 tablet 0  . Cholecalciferol (VITAMIN D PO) Take 2,000 Units by mouth daily.     Marland Kitchen OVER THE COUNTER MEDICATION Takes an OTC iron tablet daily.    . sildenafil (REVATIO) 20 MG tablet Take 1 to 5   tablets Daily as needed for XXXX 90 tablet 0  . varenicline (CHANTIX CONTINUING MONTH PAK) 1 MG tablet Take 1 tablet (1 mg total) by mouth 2 (two) times daily. (Patient not taking: Reported on 04/10/2020) 60 tablet 2   No current facility-administered medications on file prior to visit.   Medical History:  Past Medical History:  Diagnosis Date  . Labile hypertension   . Osteoarthritis   . Other testicular hypofunction   .  Prediabetes    Allergies: No Known Allergies   Review of Systems:  Review of Systems  Constitutional: Negative.  Negative for malaise/fatigue and weight loss.  HENT: Negative for congestion, ear discharge, ear pain, hearing loss, nosebleeds, sore throat and tinnitus.   Eyes: Negative.   Respiratory: Negative.  Negative for stridor.   Cardiovascular: Negative.   Gastrointestinal: Negative for abdominal pain, blood in stool, constipation, diarrhea, heartburn, melena, nausea and vomiting.  Genitourinary: Negative.   Musculoskeletal: Negative for joint pain.  Skin: Negative.        Bump on back  Neurological: Negative.  Negative for  weakness and headaches.  Endo/Heme/Allergies: Negative.   Psychiatric/Behavioral: Negative.     Family history- Review and unchanged Social history- Review and unchanged Physical Exam: There were no vitals taken for this visit. Wt Readings from Last 3 Encounters:  04/10/20 170 lb (77.1 kg)  01/03/20 179 lb (81.2 kg)  09/29/19 176 lb 12.8 oz (80.2 kg)   General Appearance: Well nourished, in no apparent distress. Eyes: PERRLA, EOMs, conjunctiva no swelling or erythema Sinuses: No Frontal/maxillary tenderness ENT/Mouth: Ext aud canals clear, TMs without erythema, bulging. No erythema, swelling, or exudate on post pharynx.  Tonsils not swollen or erythematous. Hearing normal.  Neck: Supple, thyroid normal.  Respiratory: Respiratory effort normal, BS equal bilaterally without rales, rhonchi, wheezing or stridor.  Cardio: RRR with no MRGs. Brisk peripheral pulses without edema.  Abdomen: Soft, + BS,  Non tender, no guarding, rebound, hernias, masses. Lymphatics: Non tender without lymphadenopathy.  Musculoskeletal: Full ROM, 5/5 strength,  Strength is normal and symmetric in arms. Skin: upper back right of midline with seb cyst 2 cm, no redness, no warmth, Warm, dry without rashes, lesions, ecchymosis.  Neuro: Cranial nerves intact. Normal muscle tone, no cerebellar symptoms. Psych: Awake and oriented X 3, normal affect, Insight and Judgment appropriate.       Vicie Mutters, PA-C 12:10 PM [facility]

## 2020-07-31 ENCOUNTER — Encounter: Payer: Self-pay | Admitting: Physician Assistant

## 2020-07-31 ENCOUNTER — Other Ambulatory Visit: Payer: Self-pay

## 2020-07-31 ENCOUNTER — Ambulatory Visit: Payer: Managed Care, Other (non HMO) | Admitting: Physician Assistant

## 2020-07-31 VITALS — BP 120/84 | HR 90 | Temp 98.4°F | Wt 163.0 lb

## 2020-07-31 DIAGNOSIS — E782 Mixed hyperlipidemia: Secondary | ICD-10-CM | POA: Diagnosis not present

## 2020-07-31 DIAGNOSIS — R0989 Other specified symptoms and signs involving the circulatory and respiratory systems: Secondary | ICD-10-CM | POA: Diagnosis not present

## 2020-07-31 DIAGNOSIS — R7309 Other abnormal glucose: Secondary | ICD-10-CM

## 2020-07-31 DIAGNOSIS — L739 Follicular disorder, unspecified: Secondary | ICD-10-CM | POA: Diagnosis not present

## 2020-07-31 MED ORDER — KETOCONAZOLE 2 % EX CREA: 1.0000 "application " | TOPICAL_CREAM | Freq: Two times a day (BID) | CUTANEOUS | 3 refills | Status: DC

## 2020-07-31 NOTE — Progress Notes (Signed)
Assessment and Plan:  Folliculitis Likely folliculitis on his neck Start using ketoconazole, if not improving call office.   Tobacco use disorder Smoking cessation-  instruction/counseling given, counseled patient on the dangers of tobacco use, advised patient to stop smoking, and reviewed strategies to maximize success  Labile hypertension -     CBC with Differential/Platelet -     COMPLETE METABOLIC PANEL WITH GFR -     TSH  Hyperlipidemia, mixed -     TSH -     Lipid panel  Abnormal glucose -     Hemoglobin A1c  Weight loss Has been trying to do more veggies and sweating with summer, no night sweats, no other symptoms, will monitor closely, if any new symptoms will get further test.      Continue diet and meds as discussed. Further disposition pending results of labs. Over 30 minutes of exam, counseling, chart review, and critical decision making was performed Future Appointments  Date Time Provider [place]  11/13/2020  2:00 PM Unk Pinto, MD [facility] None    HPI [name]  presents for 3 month follow up on hypertension, cholesterol, prediabetes, and vitamin D deficiency.    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  He denies chest pain, shortness of breath, dizziness.  BMI is Body mass index is 23.06 kg/m., he is working on diet and exercise, he has been trying to keep his weight down to help decrease pain in his right hip.  Wt Readings from Last 3 Encounters:  07/31/20 163 lb (73.9 kg)  04/10/20 170 lb (77.1 kg)  01/03/20 179 lb (81.2 kg)   Has started back to smoking, 1 pack last 3 days, will use nicotine patches and would like chantix to quit.  Last chest xray 2019. 15 year smoking history less than a pack a [place]. Started back to smoking after he lost his daughter in 2013.     He is on cholesterol medication and denies  myalgias. His cholesterol is at goal. The cholesterol last visit was:   Lab Results  Component Value Date   CHOL 175 04/10/2020   HDL 60 04/10/2020   LDLCALC 84 04/10/2020   TRIG 214 (H) 04/10/2020   CHOLHDL 2.9 04/10/2020    He has been working on diet and exercise for prediabetes, and denies paresthesia of the feet, polydipsia, polyuria and visual disturbances. Last A1C in the office was:  Lab Results  Component Value Date   HGBA1C 5.8 (H) 04/10/2020   Patient is on Vitamin D supplement.   Lab Results  Component Value Date   VD25OH 35 04/10/2020      Current Medications:    Current Outpatient Medications (Cardiovascular):  .  atorvastatin (LIPITOR) 80 MG tablet, Take 1 tablet Daily for Cholesterol .  sildenafil (REVATIO) 20 MG tablet, Take 1 to 5   tablets Daily as needed for XXXX     Current Outpatient Medications (Other):  .  acyclovir (ZOVIRAX) 400 MG tablet, Take 1 tablet 2 x /[place] to prevent fever blisters .  Ascorbic Acid (VITAMIN C PO), Take 1 tablet by mouth daily. .  Cholecalciferol (VITAMIN D PO), Take 2,000 Units by mouth daily.  Marland Kitchen  OVER THE COUNTER MEDICATION, Takes an OTC iron tablet daily. .  varenicline (CHANTIX CONTINUING MONTH PAK) 1 MG tablet, Take 1 tablet (1 mg  total) by mouth 2 (two) times daily.  Medical History:  Past Medical History:  Diagnosis Date  . Labile hypertension   . Osteoarthritis   . Other testicular hypofunction   . Prediabetes    Allergies: No Known Allergies   Review of Systems:  Review of Systems  Constitutional: Negative.  Negative for malaise/fatigue and weight loss.  HENT: Negative for congestion, ear discharge, ear pain, hearing loss, nosebleeds, sore throat and tinnitus.   Eyes: Negative.   Respiratory: Negative.  Negative for stridor.   Cardiovascular: Negative.   Gastrointestinal: Negative for abdominal pain, blood in stool, constipation, diarrhea, heartburn, melena, nausea and vomiting.  Genitourinary: Negative.     Musculoskeletal: Negative for joint pain.  Skin: Negative.        Bump on back  Neurological: Negative.  Negative for weakness and headaches.  Endo/Heme/Allergies: Negative.   Psychiatric/Behavioral: Negative.     Family history- Review and unchanged Social history- Review and unchanged Physical Exam: BP 120/84   Pulse 90   Temp 98.4 F (36.9 C)   Wt 163 lb (73.9 kg)   SpO2 98%   BMI 23.06 kg/m  Wt Readings from Last 3 Encounters:  07/31/20 163 lb (73.9 kg)  04/10/20 170 lb (77.1 kg)  01/03/20 179 lb (81.2 kg)   General Appearance: Well nourished, in no apparent distress. Eyes: PERRLA, EOMs, conjunctiva no swelling or erythema Sinuses: No Frontal/maxillary tenderness ENT/Mouth: Ext aud canals clear, TMs without erythema, bulging. No erythema, swelling, or exudate on post pharynx.  Tonsils not swollen or erythematous. Hearing normal.  Neck: Supple, thyroid normal.  Respiratory: Respiratory effort normal, BS equal bilaterally without rales, rhonchi, wheezing or stridor.  Cardio: RRR with no MRGs. Brisk peripheral pulses without edema.  Abdomen: Soft, + BS,  Non tender, no guarding, rebound, hernias, masses. Lymphatics: Non tender without lymphadenopathy.  Musculoskeletal: Full ROM, 5/5 strength,  Strength is normal and symmetric in arms. Skin: upper back right of midline with seb cyst 2 cm, no redness, no warmth, Warm, dry without rashes, lesions, ecchymosis.  Neuro: Cranial nerves intact. Normal muscle tone, no cerebellar symptoms. Psych: Awake and oriented X 3, normal affect, Insight and Judgment appropriate.         Vicie Mutters, PA-C 12:04 PM [facility]

## 2020-07-31 NOTE — Patient Instructions (Signed)
Monitor your weight loss If you lose more weight or have any new issues let us know.   Folliculitis  Folliculitis is inflammation of the hair follicles. Folliculitis most commonly occurs on the scalp, thighs, legs, back, and buttocks. However, it can occur anywhere on the body. What are the causes? This condition may be caused by:  A bacterial infection (common).  A fungal infection.  A viral infection.  Contact with certain chemicals, especially oils and tars.  Shaving or waxing.  Greasy ointments or creams applied to the skin. Long-lasting folliculitis and folliculitis that keeps coming back may be caused by bacteria. This bacteria can live anywhere on your skin and is often found in the nostrils. What increases the risk? You are more likely to develop this condition if you have:  A weakened immune system.  Diabetes.  Obesity. What are the signs or symptoms? Symptoms of this condition include:  Redness.  Soreness.  Swelling.  Itching.  Small white or yellow, pus-filled, itchy spots (pustules) that appear over a reddened area. If there is an infection that goes deep into the follicle, these may develop into a boil (furuncle).  A group of closely packed boils (carbuncle). These tend to form in hairy, sweaty areas of the body. How is this diagnosed? This condition is diagnosed with a skin exam. To find what is causing the condition, your health care provider may take a sample of one of the pustules or boils for testing in a lab. How is this treated? This condition may be treated by:  Applying warm compresses to the affected areas.  Taking an antibiotic medicine or applying an antibiotic medicine to the skin.  Applying or bathing with an antiseptic solution.  Taking an over-the-counter medicine to help with itching.  Having a procedure to drain any pustules or boils. This may be done if a pustule or boil contains a lot of pus or fluid.  Having laser hair  removal. This may be done to treat long-lasting folliculitis. Follow these instructions at home: Managing pain and swelling   If directed, apply heat to the affected area as often as told by your health care provider. Use the heat source that your health care provider recommends, such as a moist heat pack or a heating pad. ? Place a towel between your skin and the heat source. ? Leave the heat on for 20-30 minutes. ? Remove the heat if your skin turns bright red. This is especially important if you are unable to feel pain, heat, or cold. You may have a greater risk of getting burned. General instructions  If you were prescribed an antibiotic medicine, take it or apply it as told by your health care provider. Do not stop using the antibiotic even if your condition improves.  Check the irritated area every [place] for signs of infection. Check for: ? Redness, swelling, or pain. ? Fluid or blood. ? Warmth. ? Pus or a bad smell.  Do not shave irritated skin.  Take over-the-counter and prescription medicines only as told by your health care provider.  Keep all follow-up visits as told by your health care provider. This is important. Get help right away if:  You have more redness, swelling, or pain in the affected area.  Red streaks are spreading from the affected area.  You have a fever. Summary  Folliculitis is inflammation of the hair follicles. Folliculitis most commonly occurs on the scalp, thighs, legs, back, and buttocks.  This condition may be treated by  taking an antibiotic medicine or applying an antibiotic medicine to the skin, and applying or bathing with an antiseptic solution.  If you were prescribed an antibiotic medicine, take it or apply it as told by your health care provider. Do not stop using the antibiotic even if your condition improves.  Get help right away if you have new or worsening symptoms.  Keep all follow-up visits as told by your health care provider.  This is important. This information is not intended to replace advice given to you by your health care provider. Make sure you discuss any questions you have with your health care provider. Document Revised: 06/13/2018 Document Reviewed: 06/13/2018 Elsevier Patient Education  [place].

## 2020-08-01 LAB — COMPLETE METABOLIC PANEL WITH GFR
AG Ratio: 1.4 (calc) (ref 1.0–2.5)
ALT: 23 U/L (ref 9–46)
AST: 24 U/L (ref 10–35)
Albumin: 4.1 g/dL (ref 3.6–5.1)
Alkaline phosphatase (APISO): 60 U/L (ref 35–144)
BUN: 12 mg/dL (ref 7–25)
CO2: 30 mmol/L (ref 20–32)
Calcium: 9.5 mg/dL (ref 8.6–10.3)
Chloride: 103 mmol/L (ref 98–110)
Creat: 1.11 mg/dL (ref 0.70–1.25)
GFR, Est African American: 81 mL/min/{1.73_m2} (ref >=60)
GFR, Est Non African American: 70 mL/min/{1.73_m2} (ref >=60)
Globulin: 2.9 g/dL (calc) (ref 1.9–3.7)
Glucose, Bld: 80 mg/dL (ref 65–99)
Potassium: 4.8 mmol/L (ref 3.5–5.3)
Sodium: 141 mmol/L (ref 135–146)
Total Bilirubin: 0.4 mg/dL (ref 0.2–1.2)
Total Protein: 7 g/dL (ref 6.1–8.1)

## 2020-08-01 LAB — CBC WITH DIFFERENTIAL/PLATELET
Absolute Monocytes: 271 cells/uL (ref 200–950)
Basophils Absolute: 78 cells/uL (ref 0–200)
Basophils Relative: 1.7 %
Eosinophils Absolute: 152 cells/uL (ref 15–500)
Eosinophils Relative: 3.3 %
HCT: 38.6 % (ref 38.5–50.0)
Hemoglobin: 12.6 g/dL — ABNORMAL LOW (ref 13.2–17.1)
Lymphs Abs: 2065 cells/uL (ref 850–3900)
MCH: 30.7 pg (ref 27.0–33.0)
MCHC: 32.6 g/dL (ref 32.0–36.0)
MCV: 94.1 fL (ref 80.0–100.0)
MPV: 9.6 fL (ref 7.5–12.5)
Monocytes Relative: 5.9 %
Neutro Abs: 2033 cells/uL (ref 1500–7800)
Neutrophils Relative %: 44.2 %
Platelets: 316 10*3/uL (ref 140–400)
RBC: 4.1 10*6/uL — ABNORMAL LOW (ref 4.20–5.80)
RDW: 13.3 % (ref 11.0–15.0)
Total Lymphocyte: 44.9 %
WBC: 4.6 10*3/uL (ref 3.8–10.8)

## 2020-08-01 LAB — LIPID PANEL
Cholesterol: 151 mg/dL (ref <200)
HDL: 58 mg/dL (ref >=40)
LDL Cholesterol (Calc): 63 mg/dL (calc)
Non-HDL Cholesterol (Calc): 93 mg/dL (calc) (ref <130)
Total CHOL/HDL Ratio: 2.6 (calc) (ref <5.0)
Triglycerides: 251 mg/dL — ABNORMAL HIGH (ref <150)

## 2020-08-01 LAB — HEMOGLOBIN A1C
Hgb A1c MFr Bld: 5.9 % of total Hgb — ABNORMAL HIGH (ref <5.7)
Mean Plasma Glucose: 123 (calc)
eAG (mmol/L): 6.8 (calc)

## 2020-08-01 LAB — TSH: TSH: 1.27 mIU/L (ref 0.40–4.50)

## 2020-08-14 ENCOUNTER — Encounter: Payer: Self-pay | Admitting: Gastroenterology

## 2020-09-21 ENCOUNTER — Encounter: Payer: Managed Care, Other (non HMO) | Admitting: Internal Medicine

## 2020-09-25 ENCOUNTER — Ambulatory Visit (AMBULATORY_SURGERY_CENTER): Payer: Self-pay

## 2020-09-25 ENCOUNTER — Other Ambulatory Visit: Payer: Self-pay

## 2020-09-25 VITALS — Ht 70.5 in | Wt 160.0 lb

## 2020-09-25 DIAGNOSIS — Z86010 Personal history of colonic polyps: Secondary | ICD-10-CM

## 2020-09-25 DIAGNOSIS — Z800 Family history of malignant neoplasm of digestive organs: Secondary | ICD-10-CM

## 2020-09-25 MED ORDER — PLENVU 140 G PO SOLR
1.0000 | Freq: Once | ORAL | 0 refills | Status: AC
Start: 2020-09-25 — End: 2020-09-25

## 2020-09-25 NOTE — Progress Notes (Signed)
No egg or soy allergy known to patient  No issues with past sedation with any surgeries or procedures no intubation problems in the past  No FH of Malignant Hyperthermia No diet pills per patient No home 02 use per patient  No blood thinners per patient  Pt denies issues with constipation  No A fib or A flutter  EMMI video to pt or via MyChart  COVID 19 guidelines implemented in [facility] today with Pt and RN   Plenvu Coupon given to pt in [facility] today , Code to Pharmacy   Due to the COVID-19 pandemic we are asking patients to follow these guidelines. Please only bring one care partner. Please be aware that your care partner may wait in the car in the parking lot or if they feel like they will be too hot to wait in the car, they may wait in the lobby on the 4th floor. All care partners are required to wear a mask the entire time (we do not have any that we can provide them), they need to practice social distancing, and we will do a Covid check for all patient's and care partners when you arrive. Also we will check their temperature and your temperature. If the care partner waits in their car they need to stay in the parking lot the entire time and we will call them on their cell phone when the patient is ready for discharge so they can bring the car to the front of the building. Also all patient's will need to wear a mask into building.  

## 2020-09-26 ENCOUNTER — Encounter: Payer: Self-pay | Admitting: Gastroenterology

## 2020-10-09 ENCOUNTER — Ambulatory Visit (AMBULATORY_SURGERY_CENTER): Payer: Managed Care, Other (non HMO) | Admitting: Gastroenterology

## 2020-10-09 ENCOUNTER — Encounter: Payer: Self-pay | Admitting: Gastroenterology

## 2020-10-09 ENCOUNTER — Other Ambulatory Visit: Payer: Self-pay | Admitting: Gastroenterology

## 2020-10-09 ENCOUNTER — Other Ambulatory Visit: Payer: Self-pay

## 2020-10-09 VITALS — BP 123/71 | HR 55 | Temp 98.6°F | Resp 24 | Ht 70.0 in | Wt 160.0 lb

## 2020-10-09 DIAGNOSIS — Z86010 Personal history of colonic polyps: Secondary | ICD-10-CM | POA: Diagnosis not present

## 2020-10-09 DIAGNOSIS — D124 Benign neoplasm of descending colon: Secondary | ICD-10-CM | POA: Diagnosis not present

## 2020-10-09 MED ORDER — SODIUM CHLORIDE 0.9 % IV SOLN: 500.0000 mL | Freq: Once | INTRAVENOUS | Status: DC

## 2020-10-09 NOTE — Progress Notes (Signed)
Pt's states no medical or surgical changes since previsit or office visit. 

## 2020-10-09 NOTE — Op Note (Signed)
[place] Patient Name: Tony Ware Procedure Date: 10/09/2020 1:34 PM MRN: [id number] Endoscopist: Ladene Artist , MD Age: 64 Referring MD:  Date of Birth: [birth year] Gender: Male Account #: [id number] Procedure:                Colonoscopy Indications:              Surveillance: Personal history of adenomatous                            polyps on last colonoscopy > 5 years ago Medicines:                Monitored Anesthesia Care Procedure:                Pre-Anesthesia Assessment:                           - Prior to the procedure, a History and Physical                            was performed, and patient medications and                            allergies were reviewed. The patient's tolerance of                            previous anesthesia was also reviewed. The risks                            and benefits of the procedure and the sedation                            options and risks were discussed with the patient.                            All questions were answered, and informed consent                            was obtained. Prior Anticoagulants: The patient has                            taken no previous anticoagulant or antiplatelet                            agents. ASA Grade Assessment: II - A patient with                            mild systemic disease. After reviewing the risks                            and benefits, the patient was deemed in                            satisfactory condition to undergo the procedure.  After obtaining informed consent, the colonoscope                            was passed under direct vision. Throughout the                            procedure, the patient's blood pressure, pulse, and                            oxygen saturations were monitored continuously. The                            Colonoscope was introduced through the anus and                            advanced to the the cecum,  identified by                            appendiceal orifice and ileocecal valve. The                            ileocecal valve, appendiceal orifice, and rectum                            were photographed. The quality of the bowel                            preparation was excellent. The colonoscopy was                            performed without difficulty. The patient tolerated                            the procedure well. Scope In: 1:42:57 PM Scope Out: 1:56:50 PM Scope Withdrawal Time: 0 hours 12 minutes 12 seconds  Total Procedure Duration: 0 hours 13 minutes 53 seconds  Findings:                 The perianal and digital rectal examinations were                            normal.                           Two sessile polyps were found in the descending                            colon. The polyps were 6 to 7 mm in size. These                            polyps were removed with a cold snare. Resection                            and retrieval were complete.  Internal hemorrhoids were found during                            retroflexion. The hemorrhoids were small and Grade                            I (internal hemorrhoids that do not prolapse).                           The exam was otherwise without abnormality on                            direct and retroflexion views. Complications:            No immediate complications. Estimated blood loss:                            None. Estimated Blood Loss:     Estimated blood loss: none. Impression:               - Two 6 to 7 mm polyps in the descending colon,                            removed with a cold snare. Resected and retrieved.                           - Internal hemorrhoids.                           - The examination was otherwise normal on direct                            and retroflexion views. Recommendation:           - Repeat colonoscopy after studies are complete for                             surveillance based on pathology results.                           - Patient has a contact number available for                            emergencies. The signs and symptoms of potential                            delayed complications were discussed with the                            patient. Return to normal activities tomorrow.                            Written discharge instructions were provided to the                            patient.                           -  Resume previous diet.                           - Continue present medications.                           - Await pathology results. Ladene Artist, MD 10/09/2020 2:01:13 PM This report has been signed electronically.

## 2020-10-09 NOTE — Progress Notes (Signed)
Called to room to assist during endoscopic procedure.  Patient ID and intended procedure confirmed with present staff. Received instructions for my participation in the procedure from the performing physician.  

## 2020-10-09 NOTE — Progress Notes (Signed)
Report to PACU, RN, vss, BBS= Clear.  

## 2020-10-09 NOTE — Patient Instructions (Signed)
Handouts given for polyps and hemorrhoids.  Await pathology results.  YOU HAD AN ENDOSCOPIC PROCEDURE TODAY AT [facility]:   Refer to the procedure report that was given to you for any specific questions about what was found during the examination.  If the procedure report does not answer your questions, please call your gastroenterologist to clarify.  If you requested that your care partner not be given the details of your procedure findings, then the procedure report has been included in a sealed envelope for you to review at your convenience later.  YOU SHOULD EXPECT: Some feelings of bloating in the abdomen. Passage of more gas than usual.  Walking can help get rid of the air that was put into your GI tract during the procedure and reduce the bloating. If you had a lower endoscopy (such as a colonoscopy or flexible sigmoidoscopy) you may notice spotting of blood in your stool or on the toilet paper. If you underwent a bowel prep for your procedure, you may not have a normal bowel movement for a few days.  Please Note:  You might notice some irritation and congestion in your nose or some drainage.  This is from the oxygen used during your procedure.  There is no need for concern and it should clear up in a [place] or so.  SYMPTOMS TO REPORT IMMEDIATELY:   Following lower endoscopy (colonoscopy or flexible sigmoidoscopy):  Excessive amounts of blood in the stool  Significant tenderness or worsening of abdominal pains  Swelling of the abdomen that is new, acute  Fever of 100F or higher  For urgent or emergent issues, a gastroenterologist can be reached at any hour by calling [phone]. Do not use MyChart messaging for urgent concerns.    DIET:  We do recommend a small meal at first, but then you may proceed to your regular diet.  Drink plenty of fluids but you should avoid alcoholic beverages for 24 hours.  ACTIVITY:  You should plan to take it easy for the rest of today  and you should NOT DRIVE or use heavy machinery until tomorrow (because of the sedation medicines used during the test).    FOLLOW UP: Our staff will call the number listed on your records 48-72 hours following your procedure to check on you and address any questions or concerns that you may have regarding the information given to you following your procedure. If we do not reach you, we will leave a message.  We will attempt to reach you two times.  During this call, we will ask if you have developed any symptoms of COVID 19. If you develop any symptoms (ie: fever, flu-like symptoms, shortness of breath, cough etc.) before then, please call [phone].  If you test positive for Covid 19 in the 2 weeks post procedure, please call and report this information to [place].    If any biopsies were taken you will be contacted by phone or by letter within the next 1-3 weeks.  Please call us at [phone] if you have not heard about the biopsies in 3 weeks.    SIGNATURES/CONFIDENTIALITY: You and/or your care partner have signed paperwork which will be entered into your electronic medical record.  These signatures attest to the fact that that the information above on your After Visit Summary has been reviewed and is understood.  Full responsibility of the confidentiality of this discharge information lies with you and/or your care-partner.

## 2020-10-10 ENCOUNTER — Telehealth: Payer: Self-pay

## 2020-10-10 NOTE — Telephone Encounter (Signed)
°  Follow up Call-  Call back number 10/09/2020  Post procedure Call Back phone  # [phone]  Permission to leave phone message Yes  Some recent data might be hidden     Patient questions:  Do you have a fever, pain , or abdominal swelling? No. Pain Score  0 *  Have you tolerated food without any problems? Yes.    Have you been able to return to your normal activities? Yes.    Do you have any questions about your discharge instructions: Diet   No. Medications  No. Follow up visit  No.  Do you have questions or concerns about your Care? No.  Actions: * If pain score is 4 or above: No action needed, pain <4.  Have you developed a fever since your procedure? no 2.   Have you had an respiratory symptoms (SOB or cough) since your procedure? no  3.   Have you tested positive for COVID 19 since your procedure no 4.   Have you had any family members/close contacts diagnosed with the COVID 19 since your procedure?  no   If yes to any of these questions please route to Joylene John, RN and Joella Prince, RN

## 2020-10-26 ENCOUNTER — Encounter: Payer: Self-pay | Admitting: Gastroenterology

## 2020-10-31 ENCOUNTER — Other Ambulatory Visit: Payer: Self-pay | Admitting: Internal Medicine

## 2020-11-13 ENCOUNTER — Encounter: Payer: Managed Care, Other (non HMO) | Admitting: Internal Medicine

## 2021-01-17 ENCOUNTER — Other Ambulatory Visit: Payer: Self-pay | Admitting: Internal Medicine

## 2021-01-22 ENCOUNTER — Encounter: Payer: Self-pay | Admitting: Internal Medicine

## 2021-01-22 NOTE — Patient Instructions (Signed)
Due to recent changes in healthcare laws, you may see the results of your imaging and laboratory studies on MyChart before your provider has had a chance to review them.  We understand that in some cases there may be results that are confusing or concerning to you. Not all laboratory results come back in the same time frame and the provider may be waiting for multiple results in order to interpret others.  Please give us 48 hours in order for your provider to thoroughly review all the results before contacting the office for clarification of your results.   ++++++++++++++++++++++++++++++++++++++  Vit D  & Vit C 1,000 mg   are recommended to help protect  against the Covid-19 and other Corona viruses.    Also it's recommended  to take  Zinc 50 mg  to help  protect against the Covid-19   and best place to get  is also on [employer]  and don't pay more than 6-8 cents /pill !  =============================== Coronavirus (COVID-19) Are you at risk?  Are you at risk for the Coronavirus (COVID-19)?  To be considered HIGH RISK for Coronavirus (COVID-19), you have to meet the following criteria:  . Traveled to [place], [place], [place], [place] or [place]; or in the [place] to [place], [place], [place]  . or [place]; and have fever, cough, and shortness of breath within the last 2 weeks of travel OR . Been in close contact with a person diagnosed with COVID-19 within the last 2 weeks and have  . fever, cough,and shortness of breath .  . IF YOU DO NOT MEET THESE CRITERIA, YOU ARE CONSIDERED LOW RISK FOR COVID-19.  What to do if you are HIGH RISK for COVID-19?  . If you are having a medical emergency, call 911. . Seek medical care right away. Before you go to a doctor's office, urgent care or emergency department, .  call ahead and tell them about your recent travel, contact with someone diagnosed with COVID-19  .  and your symptoms.  . You should receive instructions from your  physician's office regarding next steps of care.  . When you arrive at healthcare provider, tell the healthcare staff immediately you have returned from  . visiting [place], [place], [place], [place] or [place]; or traveled in the [place] to [place], [place],  . [place] or [place] in the last two weeks or you have been in close contact with a person diagnosed with  . COVID-19 in the last 2 weeks.   . Tell the health care staff about your symptoms: fever, cough and shortness of breath. . After you have been seen by a medical provider, you will be either: o Tested for (COVID-19) and discharged home on quarantine except to seek medical care if  o symptoms worsen, and asked to  - Stay home and avoid contact with others until you get your results (4-5 days)  - Avoid travel on public transportation if possible (such as bus, train, or airplane) or o Sent to the Emergency Department by EMS for evaluation, COVID-19 testing  and  o possible admission depending on your condition and test results.  What to do if you are LOW RISK for COVID-19?  Reduce your risk of any infection by using the same precautions used for avoiding the common cold or flu:  . Wash your hands often with soap and warm water for at least 20 seconds.  If soap and water are not readily   available,  . use an alcohol-based hand sanitizer with at least 60% alcohol.  . If coughing or sneezing, cover your mouth and nose by coughing or sneezing into the elbow areas of your shirt or coat, .  into a tissue or into your sleeve (not your hands). . Avoid shaking hands with others and consider head nods or verbal greetings only. . Avoid touching your eyes, nose, or mouth with unwashed hands.  . Avoid close contact with people who are sick. . Avoid places or events with large numbers of people in one location, like concerts or sporting events. . Carefully consider travel plans you have or are making. . If you are planning any travel  outside or inside the [place], visit the CDC's Travelers' Health webpage for the latest health notices. . If you have some symptoms but not all symptoms, continue to monitor at home and seek medical attention  . if your symptoms worsen. . If you are having a medical emergency, call 911. >>>>>>>>>>>>>>>>>>>>>>>>>>>> Preventive Care for Adults  A healthy lifestyle and preventive care can promote health and wellness. Preventive health guidelines for men include the following key practices:  A routine yearly physical is a good way to check with your health care provider about your health and preventative screening. It is a chance to share any concerns and updates on your health and to receive a thorough exam.  Visit your dentist for a routine exam and preventative care every 6 months. Brush your teeth twice a [place] and floss once a [place]. Good oral hygiene prevents tooth decay and gum disease.  The frequency of eye exams is based on your age, health, family medical history, use of contact lenses, and other factors. Follow your health care provider's recommendations for frequency of eye exams.  Eat a healthy diet. Foods such as vegetables, fruits, whole grains, low-fat dairy products, and lean protein foods contain the nutrients you need without too many calories. Decrease your intake of foods high in solid fats, added sugars, and salt. Eat the right amount of calories for you. Get information about a proper diet from your health care provider, if necessary.  Regular physical exercise is one of the most important things you can do for your health. Most adults should get at least 150 minutes of moderate-intensity exercise (any activity that increases your heart rate and causes you to sweat) each week. In addition, most adults need muscle-strengthening exercises on 2 or more days a week.  Maintain a healthy weight. The body mass index (BMI) is a screening tool to identify possible weight problems. It provides an  estimate of body fat based on height and weight. Your health care provider can find your BMI and can help you achieve or maintain a healthy weight. For adults 20 years and older:  A BMI below 18.5 is considered underweight.  A BMI of 18.5 to 24.9 is normal.  A BMI of 25 to 29.9 is considered overweight.  A BMI of 30 and above is considered obese.  Maintain normal blood lipids and cholesterol levels by exercising and minimizing your intake of saturated fat. Eat a balanced diet with plenty of fruit and vegetables. Blood tests for lipids and cholesterol should begin at age 20 and be repeated every 5 years. If your lipid or cholesterol levels are high, you are over 50, or you are at high risk for heart disease, you may need your cholesterol levels checked more frequently. Ongoing high lipid and cholesterol levels should be treated   with medicines if diet and exercise are not working.  If you smoke, find out from your health care provider how to quit. If you do not use tobacco, do not start.  Lung cancer screening is recommended for adults aged 55-80 years who are at high risk for developing lung cancer because of a history of smoking. A yearly low-dose CT scan of the lungs is recommended for people who have at least a 30-pack-year history of smoking and are a current smoker or have quit within the past 15 years. A pack year of smoking is smoking an average of 1 pack of cigarettes a [place] for 1 year (for example: 1 pack a [place] for 30 years or 2 packs a [place] for 15 years). Yearly screening should continue until the smoker has stopped smoking for at least 15 years. Yearly screening should be stopped for people who develop a health problem that would prevent them from having lung cancer treatment.  If you choose to drink alcohol, do not have more than 2 drinks per [place]. One drink is considered to be 12 ounces (355 mL) of beer, 5 ounces (148 mL) of wine, or 1.5 ounces (44 mL) of liquor.  Avoid use of street  drugs. Do not share needles with anyone. Ask for help if you need support or instructions about stopping the use of drugs.  High blood pressure causes heart disease and increases the risk of stroke. Your blood pressure should be checked at least every 1-2 years. Ongoing high blood pressure should be treated with medicines, if weight loss and exercise are not effective.  If you are 45-79 years old, ask your health care provider if you should take aspirin to prevent heart disease.  Diabetes screening involves taking a blood sample to check your fasting blood sugar level. This should be done once every 3 years, after age 45, if you are within normal weight and without risk factors for diabetes. Testing should be considered at a younger age or be carried out more frequently if you are overweight and have at least 1 risk factor for diabetes.  Colorectal cancer can be detected and often prevented. Most routine colorectal cancer screening begins at the age of 50 and continues through age 75. However, your health care provider may recommend screening at an earlier age if you have risk factors for colon cancer. On a yearly basis, your health care provider may provide home test kits to check for hidden blood in the stool. Use of a small camera at the end of a tube to directly examine the colon (sigmoidoscopy or colonoscopy) can detect the earliest forms of colorectal cancer. Talk to your health care provider about this at age 50, when routine screening begins. Direct exam of the colon should be repeated every 5-10 years through age 75, unless early forms of precancerous polyps or small growths are found.   Talk with your health care provider about prostate cancer screening.  Testicular cancer screening isrecommended for adult males. Screening includes self-exam, a health care provider exam, and other screening tests. Consult with your health care provider about any symptoms you have or any concerns you have about  testicular cancer.  Use sunscreen. Apply sunscreen liberally and repeatedly throughout the [place]. You should seek shade when your shadow is shorter than you. Protect yourself by wearing long sleeves, pants, a wide-brimmed hat, and sunglasses year round, whenever you are outdoors.  Once a month, do a whole-body skin exam, using a mirror to look at   the skin on your back. Tell your health care provider about new moles, moles that have irregular borders, moles that are larger than a pencil eraser, or moles that have changed in shape or color.  Stay current with required vaccines (immunizations).  Influenza vaccine. All adults should be immunized every year.  Tetanus, diphtheria, and acellular pertussis (Td, Tdap) vaccine. An adult who has not previously received Tdap or who does not know his vaccine status should receive 1 dose of Tdap. This initial dose should be followed by tetanus and diphtheria toxoids (Td) booster doses every 10 years. Adults with an unknown or incomplete history of completing a 3-dose immunization series with Td-containing vaccines should begin or complete a primary immunization series including a Tdap dose. Adults should receive a Td booster every 10 years.  Varicella vaccine. An adult without evidence of immunity to varicella should receive 2 doses or a second dose if he has previously received 1 dose.  Human papillomavirus (HPV) vaccine. Males aged 13-21 years who have not received the vaccine previously should receive the 3-dose series. Males aged 22-26 years may be immunized. Immunization is recommended through the age of 26 years for any male who has sex with males and did not get any or all doses earlier. Immunization is recommended for any person with an immunocompromised condition through the age of 26 years if he did not get any or all doses earlier. During the 3-dose series, the second dose should be obtained 4-8 weeks after the first dose. The third dose should be obtained  24 weeks after the first dose and 16 weeks after the second dose.  Zoster vaccine. One dose is recommended for adults aged 60 years or older unless certain conditions are present.    PREVNAR  - Pneumococcal 13-valent conjugate (PCV13) vaccine. When indicated, a person who is uncertain of his immunization history and has no record of immunization should receive the PCV13 vaccine. An adult aged 19 years or older who has certain medical conditions and has not been previously immunized should receive 1 dose of PCV13 vaccine. This PCV13 should be followed with a dose of pneumococcal polysaccharide (PPSV23) vaccine. The PPSV23 vaccine dose should be obtained at least 1 r more year(s) after the dose of PCV13 vaccine. An adult aged 19 years or older who has certain medical conditions and previously received 1 or more doses of PPSV23 vaccine should receive 1 dose of PCV13. The PCV13 vaccine dose should be obtained 1 or more years after the last PPSV23 vaccine dose.    PNEUMOVAX - Pneumococcal polysaccharide (PPSV23) vaccine. When PCV13 is also indicated, PCV13 should be obtained first. All adults aged 65 years and older should be immunized. An adult younger than age 65 years who has certain medical conditions should be immunized. Any person who resides in a nursing home or long-term care facility should be immunized. An adult smoker should be immunized. People with an immunocompromised condition and certain other conditions should receive both PCV13 and PPSV23 vaccines. People with human immunodeficiency virus (HIV) infection should be immunized as soon as possible after diagnosis. Immunization during chemotherapy or radiation therapy should be avoided. Routine use of PPSV23 vaccine is not recommended for American Indians, [place] Natives, or people younger than 65 years unless there are medical conditions that require PPSV23 vaccine. When indicated, people who have unknown immunization and have no record of  immunization should receive PPSV23 vaccine. One-time revaccination 5 years after the first dose of PPSV23 is recommended for people aged   19-64 years who have chronic kidney failure, nephrotic syndrome, asplenia, or immunocompromised conditions. People who received 1-2 doses of PPSV23 before age 65 years should receive another dose of PPSV23 vaccine at age 65 years or later if at least 5 years have passed since the previous dose. Doses of PPSV23 are not needed for people immunized with PPSV23 at or after age 65 years.    Hepatitis A vaccine. Adults who wish to be protected from this disease, have certain high-risk conditions, work with hepatitis A-infected animals, work in hepatitis A research labs, or travel to or work in countries with a high rate of hepatitis A should be immunized. Adults who were previously unvaccinated and who anticipate close contact with an international adoptee during the first 60 days after arrival in the [place] from a country with a high rate of hepatitis A should be immunized.    Hepatitis B vaccine. Adults should be immunized if they wish to be protected from this disease, have certain high-risk conditions, may be exposed to blood or other infectious body fluids, are household contacts or sex partners of hepatitis B positive people, are clients or workers in certain care facilities, or travel to or work in countries with a high rate of hepatitis B.   Preventive Service / Frequency   Ages 40 to 64  Blood pressure check.  Lipid and cholesterol check  Lung cancer screening. / Every year if you are aged 55-80 years and have a 30-pack-year history of smoking and currently smoke or have quit within the past 15 years. Yearly screening is stopped once you have quit smoking for at least 15 years or develop a health problem that would prevent you from having lung cancer treatment.  Fecal occult blood test (FOBT) of stool. / Every year beginning at age 50 and continuing  until age 75. You may not have to do this test if you get a colonoscopy every 10 years.  Flexible sigmoidoscopy** or colonoscopy.** / Every 5 years for a flexible sigmoidoscopy or every 10 years for a colonoscopy beginning at age 50 and continuing until age 75. Screening for abdominal aortic aneurysm (AAA)  by ultrasound is recommended for people who have history of high blood pressure or who are current or former smokers. +++++++++++ Recommend Adult Low Dose Aspirin or  coated  Aspirin 81 mg daily  To reduce risk of Colon Cancer 40 %,  Skin Cancer 26 % ,  Malignant Melanoma 46%  and  Pancreatic cancer 60% ++++++++++++++++++++ Vitamin D goal  is between 70-100.  Please make sure that you are taking your Vitamin D as directed.  It is very important as a natural anti-inflammatory  helping hair, skin, and nails, as well as reducing stroke and heart attack risk.  It helps your bones and helps with mood. It also decreases numerous cancer risks so please take it as directed.  Low Vit D is associated with a 200-300% higher risk for CANCER  and 200-300% higher risk for HEART   ATTACK  &  STROKE.   ...................................... It is also associated with higher death rate at younger ages,  autoimmune diseases like Rheumatoid arthritis, Lupus, Multiple Sclerosis.    Also many other serious conditions, like depression, Alzheimer's Dementia, infertility, muscle aches, fatigue, fibromyalgia - just to name a few. +++++++++++++++++++++ Recommend the book "The END of DIETING" by Dr Joel Fuhrman  & the book "The END of DIABETES " by Dr Joel Fuhrman At [employer].com - get book & Audio CD's      Being diabetic has a  300% increased risk for heart attack, stroke, cancer, and alzheimer- type vascular dementia. It is very important that you work harder with diet by avoiding all foods that are white. Avoid white rice (brown & wild rice is OK), white potatoes (sweetpotatoes in moderation is OK), White  bread or wheat bread or anything made out of white flour like bagels, donuts, rolls, buns, biscuits, cakes, pastries, cookies, pizza crust, and pasta (made from white flour & egg whites) - vegetarian pasta or spinach or wheat pasta is OK. Multigrain breads like Arnold's or Pepperidge Farm, or multigrain sandwich thins or flatbreads.  Diet, exercise and weight loss can reverse and cure diabetes in the early stages.  Diet, exercise and weight loss is very important in the control and prevention of complications of diabetes which affects every system in your body, ie. Brain - dementia/stroke, eyes - glaucoma/blindness, heart - heart attack/heart failure, kidneys - dialysis, stomach - gastric paralysis, intestines - malabsorption, nerves - severe painful neuritis, circulation - gangrene & loss of a leg(s), and finally cancer and Alzheimers.    I recommend avoid fried & greasy foods,  sweets/candy, white rice (brown or wild rice or Quinoa is OK), white potatoes (sweet potatoes are OK) - anything made from white flour - bagels, doughnuts, rolls, buns, biscuits,white and wheat breads, pizza crust and traditional pasta made of white flour & egg white(vegetarian pasta or spinach or wheat pasta is OK).  Multi-grain bread is OK - like multi-grain flat bread or sandwich thins. Avoid alcohol in excess. Exercise is also important.    Eat all the vegetables you want - avoid meat, especially red meat and dairy - especially cheese.  Cheese is the most concentrated form of trans-fats which is the worst thing to clog up our arteries. Veggie cheese is OK which can be found in the fresh produce section at [employer] or [employer] or [employer]  ++++++++++++++++++++++ DASH Eating Plan  DASH stands for "Dietary Approaches to Stop Hypertension."   The DASH eating plan is a healthy eating plan that has been shown to reduce high blood pressure (hypertension). Additional health benefits may include reducing the risk of type 2  diabetes mellitus, heart disease, and stroke. The DASH eating plan may also help with weight loss. WHAT DO I NEED TO KNOW ABOUT THE DASH EATING PLAN? For the DASH eating plan, you will follow these general guidelines:  Choose foods with a percent daily value for sodium of less than 5% (as listed on the food label).  Use salt-free seasonings or herbs instead of table salt or sea salt.  Check with your health care provider or pharmacist before using salt substitutes.  Eat lower-sodium products, often labeled as "lower sodium" or "no salt added."  Eat fresh foods.  Eat more vegetables, fruits, and low-fat dairy products.  Choose whole grains. Look for the word "whole" as the first word in the ingredient list.  Choose fish   Limit sweets, desserts, sugars, and sugary drinks.  Choose heart-healthy fats.  Eat veggie cheese   Eat more home-cooked food and less restaurant, buffet, and fast food.  Limit fried foods.  Cook foods using methods other than frying.  Limit canned vegetables. If you do use them, rinse them well to decrease the sodium.  When eating at a restaurant, ask that your food be prepared with less salt, or no salt if possible.                        WHAT FOODS CAN I EAT? Read Dr Joel Fuhrman's books on The End of Dieting & The End of Diabetes  Grains Whole grain or whole wheat bread. Brown rice. Whole grain or whole wheat pasta. Quinoa, bulgur, and whole grain cereals. Low-sodium cereals. Corn or whole wheat flour tortillas. Whole grain cornbread. Whole grain crackers. Low-sodium crackers.  Vegetables Fresh or frozen vegetables (raw, steamed, roasted, or grilled). Low-sodium or reduced-sodium tomato and vegetable juices. Low-sodium or reduced-sodium tomato sauce and paste. Low-sodium or reduced-sodium canned vegetables.   Fruits All fresh, canned (in natural juice), or frozen fruits.  Protein Products  All fish and seafood.  Dried beans, peas, or lentils.  Unsalted nuts and seeds. Unsalted canned beans.  Dairy Low-fat dairy products, such as skim or 1% milk, 2% or reduced-fat cheeses, low-fat ricotta or cottage cheese, or plain low-fat yogurt. Low-sodium or reduced-sodium cheeses.  Fats and Oils Tub margarines without trans fats. Light or reduced-fat mayonnaise and salad dressings (reduced sodium). Avocado. Safflower, olive, or canola oils. Natural peanut or almond butter.  Other Unsalted popcorn and pretzels. The items listed above may not be a complete list of recommended foods or beverages. Contact your dietitian for more options.  +++++++++++++++++++  WHAT FOODS ARE NOT RECOMMENDED? Grains/ White flour or wheat flour White bread. White pasta. White rice. Refined cornbread. Bagels and croissants. Crackers that contain trans fat.  Vegetables  Creamed or fried vegetables. Vegetables in a . Regular canned vegetables. Regular canned tomato sauce and paste. Regular tomato and vegetable juices.  Fruits Dried fruits. Canned fruit in light or heavy syrup. Fruit juice.  Meat and Other Protein Products Meat in general - RED meat & White meat.  Fatty cuts of meat. Ribs, chicken wings, all processed meats as bacon, sausage, bologna, salami, fatback, hot dogs, bratwurst and packaged luncheon meats.  Dairy Whole or 2% milk, cream, half-and-half, and cream cheese. Whole-fat or sweetened yogurt. Full-fat cheeses or blue cheese. Non-dairy creamers and whipped toppings. Processed cheese, cheese spreads, or cheese curds.  Condiments Onion and garlic salt, seasoned salt, table salt, and sea salt. Canned and packaged gravies. Worcestershire sauce. Tartar sauce. Barbecue sauce. Teriyaki sauce. Soy sauce, including reduced sodium. Steak sauce. Fish sauce. Oyster sauce. Cocktail sauce. Horseradish. Ketchup and mustard. Meat flavorings and tenderizers. Bouillon cubes. Hot sauce. Tabasco sauce. Marinades. Taco seasonings. Relishes.  Fats and Oils Butter,  stick margarine, lard, shortening and bacon fat. Coconut, palm kernel, or palm oils. Regular salad dressings.  Pickles and olives. Salted popcorn and pretzels.  The items listed above may not be a complete list of foods and beverages to avoid.    

## 2021-01-22 NOTE — Progress Notes (Signed)
Annual  Screening/Preventative Visit  & Comprehensive Evaluation & Examination      This very nice 65 y.o. SBM presents for a Screening /Preventative Visit & comprehensive evaluation and management of multiple medical co-morbidities.  Patient has been followed for labile HTN, HLD, Prediabetes and Vitamin D Deficiency.      Due to hislong history of smoking over 42  Years (>65+ pk years),  I discussed lung cancer screening with him. He was agreeable to undergo a screening low dose CT scan of the chest.  We discussed smoking cessation techniques/options. I will refer him  for a LDCT lung scan and discussed smoking cessation modalities /techniques.      Labile HTN predates circa 1995. Patient's BP has been controlled at home.  Today's BP is at goal -  112/74. Patient denies any cardiac symptoms as chest pain, palpitations, shortness of breath, dizziness or ankle swelling.      Patient's hyperlipidemia is controlled with diet and Atorvastatin. Patient denies myalgias or other medication SE's. Last lipids were at goal except elevated Trig's:  Lab Results  Component Value Date   CHOL 151 07/31/2020   HDL 58 07/31/2020   LDLCALC 63 07/31/2020   TRIG 251 (H) 07/31/2020   CHOLHDL 2.6 07/31/2020       Patient has hx/o prediabetes (A1c 6.0%/2012 & 6.1%/2015) and patient denies reactive hypoglycemic symptoms, visual blurring, diabetic polys or paresthesias. Last A1c was not at goal:  Lab Results  Component Value Date   HGBA1C 5.9 (H) 07/31/2020         Finally, patient has history of Vitamin D Deficiency and last vitamin D was still very low:  Lab Results  Component Value Date   VD25OH 35 04/10/2020    Current Outpatient Medications on File Prior to Visit  Medication Sig  . acetaminophen (TYLENOL) 325 MG tablet Take 650 mg  every 6  hours as needed.  Marland Kitchen acyclovir (ZOVIRAX) 400 MG tablet Take 1 tablet 2 x /[place]omberg to prevent fever blisters  . Ascorbic Acid (VITAMIN C PO) Take 1 tablet   daily.  Marland Kitchen atorvastatin (LIPITOR) 80 MG tablet TAKE ONE TABLET DAILY FOR CHOLESTEROL  . chlorhexidine (PERIDEX) 0.12 % solution 15 mLs 2 (two) times daily.  Marland Kitchen VITAMIN D 2,000 Units  Takedaily.   Marland Kitchen ketoconazole (NIZORAL) 2 % cream Apply 1 application topically 2 (two) times daily.  Marland Kitchen OVER THE COUNTER MEDICATION Takes an OTC iron tablet daily.  . sildenafil (REVATIO) 20 MG tablet Take      1 to 5 tablets       /[place] as needed for XXXX    No Known Allergies  Past Medical History:  Diagnosis Date  . Labile hypertension    09/25/20- pt states no HTN  . Osteoarthritis   . Other testicular hypofunction   . Prediabetes     Health Maintenance  Topic Date Due  . Hepatitis C Screening  Never done  . COVID-19 Vaccine (1) Never done  . HIV Screening  Never done  . INFLUENZA VACCINE  06/18/2020  . TETANUS/TDAP  12/24/2025  . COLONOSCOPY  10/10/2027  . HPV VACCINES  Aged Out    Immunization History  Administered Date(s) Administered  . Influenza Inj Mdck Quad With Preservative 09/29/2019  . PPD Test 06/12/2017, 08/24/2018, 09/29/2019  . Td 11/18/2004  . Tdap 12/25/2015    Last Colon - 06/18/2010 - Dr Deatra Ina - Recc f/u in 5 years - overdue - patient aware  Past Surgical History:  Procedure Laterality Date  . COLONOSCOPY  06/2010   5 yr recall  . HERNIA REPAIR    . THULIUM LASER TURP (TRANSURETHRAL RESECTION OF PROSTATE) N/A 01/05/2019   Procedure: THULIUM LASER TURP (TRANSURETHRAL RESECTION OF PROSTATE);  Surgeon: Festus Aloe, MD;  Location: [facility];  Service: Urology;  Laterality: N/A;    Family History  Problem Relation Age of Onset  . Hypertension Mother   . Diabetes Mother   . Cancer Father        colon  . Colon cancer Father   . Colon polyps Father   . Esophageal cancer Neg Hx   . Rectal cancer Neg Hx   . Stomach cancer Neg Hx     Social History   Socioeconomic History  . Marital status: Single  . Number of children: 4 daughters  (I died in  labor) & 7 GC  Occupational History  . 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 Smoking status: Current Every Murillo Smoker    Packs/Goeken: 0.25    Years: 41.00    Pack years: 10.25    Types: Cigarettes    Last attempt to quit: 06/26/2015    Years since quitting: 5.5  . Smokeless tobacco: Never Used  . Tobacco comment: 09/2020-5-6 cigarettes per Pop  Vaping Use  . Vaping Use: Never used  Substance and Sexual Activity  . Alcohol use: Yes    Alcohol/week: 2.0 standard drinks    Types: 2 Standard drinks or equivalent per week    Comment: beers  . Drug use: No  . Sexual activity: Yes    ROS Constitutional: Denies fever, chills, weight loss/gain, headaches, insomnia,  night sweats or change in appetite. Does c/o fatigue. Eyes: Denies redness, blurred vision, diplopia, discharge, itchy or watery eyes.  ENT: Denies discharge, congestion, post nasal drip, epistaxis, sore throat, earache, hearing loss, dental pain, Tinnitus, Vertigo, Sinus pain or snoring.  Cardio: Denies chest pain, palpitations, irregular heartbeat, syncope, dyspnea, diaphoresis, orthopnea, PND, claudication or edema Respiratory: denies cough, dyspnea, DOE, pleurisy, hoarseness, laryngitis or wheezing.  Gastrointestinal: Denies dysphagia, heartburn, reflux, water brash, pain, cramps, nausea, vomiting, bloating, diarrhea, constipation, hematemesis, melena, hematochezia, jaundice or hemorrhoids Genitourinary: Denies dysuria, frequency, urgency, nocturia, hesitancy, discharge, hematuria or flank pain Musculoskeletal: Denies arthralgia, myalgia, stiffness, Jt. Swelling, pain, limp or strain/sprain. Denies Falls. Skin: Denies puritis, rash, hives, warts, acne, eczema or change in skin lesion Neuro: No weakness, tremor, incoordination, spasms, paresthesia or pain Psychiatric: Denies confusion, memory loss or sensory loss. Denies Depression. Endocrine: Denies change in weight, skin, hair  change, nocturia, and paresthesia, diabetic polys, visual blurring or hyper / hypo glycemic episodes.  Heme/Lymph: No excessive bleeding, bruising or enlarged lymph nodes.  Physical Exam  BP 112/74   Pulse 71   Temp (!) 97.3 F (36.3 C)   Resp 16   Ht 5' 10.5" (1.791 m)   Wt 162 lb (73.5 kg)   SpO2 98%   BMI 22.92 kg/m   General Appearance: Well nourished and well groomed and in no apparent distress.  Eyes: PERRLA, EOMs, conjunctiva no swelling or erythema, normal fundi and vessels. Sinuses: No frontal/maxillary tenderness ENT/Mouth: EACs patent / TMs  nl. Nares clear without erythema, swelling, mucoid exudates. Oral hygiene is good. No erythema, swelling, or exudate. Tongue normal, non-obstructing. Tonsils not swollen or erythematous. Hearing normal.  Neck: Supple, thyroid not palpable. No bruits, nodes or JVD. Respiratory: Respiratory effort  normal.  BS equal and clear bilateral without rales, rhonci, wheezing or stridor. Cardio: Heart sounds are normal with regular rate and rhythm and no murmurs, rubs or gallops. Peripheral pulses are normal and equal bilaterally without edema. No aortic or femoral bruits. Chest: symmetric with normal excursions and percussion.  Abdomen: Soft, with Nl bowel sounds. Nontender, no guarding, rebound, hernias, masses, or organomegaly.  Lymphatics: Non tender without lymphadenopathy.  Musculoskeletal: Full ROM all peripheral extremities, joint stability, 5/5 strength, and normal gait. Skin: Warm and dry without rashes, lesions, cyanosis, clubbing or  ecchymosis.  Neuro: Cranial nerves intact, reflexes equal bilaterally. Normal muscle tone, no cerebellar symptoms. Sensation intact.  Pysch: Alert and oriented X 3 with normal affect, insight and judgment appropriate.   Assessment and Plan  1. Annual Preventative/Screening Exam    2. Labile hypertension  - EKG 12-Lead - [place], RETROPERITNL ABD,  LTD - CBC with Differential/Platelet - COMPLETE  METABOLIC PANEL WITH GFR - Magnesium - TSH  3. Hyperlipidemia, mixed  - EKG 12-Lead - [place], RETROPERITNL ABD,  LTD - Lipid panel - TSH  4. Abnormal glucose  - EKG 12-Lead - [place], RETROPERITNL ABD,  LTD - Hemoglobin A1c - Insulin, random  5. Vitamin D deficiency  - VITAMIN D 25 Hydroxy  6. Prediabetes  - EKG 12-Lead - [place], RETROPERITNL ABD,  LTD - Hemoglobin A1c - Insulin, random  7. Screening examination for pulmonary tuberculosis  - TB Skin Test  8. Screening for colorectal cancer  - POC Hemoccult Bld/Stl  9. BPH with obstruction/lower urinary tract symptoms  - PSA  10. Prostate cancer screening  - PSA  11. Screening for ischemic heart disease  - EKG 12-Lead  12. FH: hypertension  - EKG 12-Lead - [place], RETROPERITNL ABD,  LTD  13. Former smoker  - EKG 12-Lead - [place], RETROPERITNL ABD,  LTD  14. Screening for AAA (aortic abdominal aneurysm)  - [place], RETROPERITNL ABD,  LTD  15. Fatigue, unspecified type  - Iron,Total/Total Iron Binding Cap - Vitamin B12 - CBC with Differential/Platelet - TSH  16. Medication management  - Urinalysis, Routine w reflex microscopic - Microalbumin / creatinine urine ratio - CBC with Differential/Platelet - COMPLETE METABOLIC PANEL WITH GFR - Magnesium - Lipid panel - TSH - Hemoglobin A1c - Insulin, random - VITAMIN D 25 Hydroxy         Patient was counseled in prudent diet, weight control to achieve/maintain BMI less than 25, BP monitoring, regular exercise and medications as discussed.  Discussed med effects and SE's. Routine screening labs and tests as requested with regular follow-up as recommended. Over 40 minutes of exam, counseling, chart review and high complex critical decision making was performed   Kirtland Bouchard, MD

## 2021-01-23 ENCOUNTER — Other Ambulatory Visit: Payer: Self-pay

## 2021-01-23 ENCOUNTER — Ambulatory Visit: Payer: Managed Care, Other (non HMO) | Admitting: Internal Medicine

## 2021-01-23 VITALS — BP 112/74 | HR 71 | Temp 97.3°F | Resp 16 | Ht 70.5 in | Wt 162.0 lb

## 2021-01-23 DIAGNOSIS — E782 Mixed hyperlipidemia: Secondary | ICD-10-CM

## 2021-01-23 DIAGNOSIS — R350 Frequency of micturition: Secondary | ICD-10-CM | POA: Diagnosis not present

## 2021-01-23 DIAGNOSIS — Z130 Encounter for screening for diseases of the blood and blood-forming organs and certain disorders involving the immune mechanism: Secondary | ICD-10-CM | POA: Diagnosis not present

## 2021-01-23 DIAGNOSIS — Z0000 Encounter for general adult medical examination without abnormal findings: Secondary | ICD-10-CM | POA: Diagnosis not present

## 2021-01-23 DIAGNOSIS — R7303 Prediabetes: Secondary | ICD-10-CM

## 2021-01-23 DIAGNOSIS — Z125 Encounter for screening for malignant neoplasm of prostate: Secondary | ICD-10-CM | POA: Diagnosis not present

## 2021-01-23 DIAGNOSIS — Z8249 Family history of ischemic heart disease and other diseases of the circulatory system: Secondary | ICD-10-CM | POA: Diagnosis not present

## 2021-01-23 DIAGNOSIS — Z79899 Other long term (current) drug therapy: Secondary | ICD-10-CM | POA: Diagnosis not present

## 2021-01-23 DIAGNOSIS — Z13220 Encounter for screening for lipoid disorders: Secondary | ICD-10-CM

## 2021-01-23 DIAGNOSIS — E559 Vitamin D deficiency, unspecified: Secondary | ICD-10-CM | POA: Diagnosis not present

## 2021-01-23 DIAGNOSIS — Z131 Encounter for screening for diabetes mellitus: Secondary | ICD-10-CM

## 2021-01-23 DIAGNOSIS — N401 Enlarged prostate with lower urinary tract symptoms: Secondary | ICD-10-CM | POA: Diagnosis not present

## 2021-01-23 DIAGNOSIS — Z0001 Encounter for general adult medical examination with abnormal findings: Secondary | ICD-10-CM

## 2021-01-23 DIAGNOSIS — Z136 Encounter for screening for cardiovascular disorders: Secondary | ICD-10-CM | POA: Diagnosis not present

## 2021-01-23 DIAGNOSIS — F17200 Nicotine dependence, unspecified, uncomplicated: Secondary | ICD-10-CM

## 2021-01-23 DIAGNOSIS — Z87891 Personal history of nicotine dependence: Secondary | ICD-10-CM

## 2021-01-23 DIAGNOSIS — Z1389 Encounter for screening for other disorder: Secondary | ICD-10-CM

## 2021-01-23 DIAGNOSIS — R5383 Other fatigue: Secondary | ICD-10-CM

## 2021-01-23 DIAGNOSIS — R0989 Other specified symptoms and signs involving the circulatory and respiratory systems: Secondary | ICD-10-CM | POA: Diagnosis not present

## 2021-01-23 DIAGNOSIS — N138 Other obstructive and reflux uropathy: Secondary | ICD-10-CM

## 2021-01-23 DIAGNOSIS — R7309 Other abnormal glucose: Secondary | ICD-10-CM

## 2021-01-23 DIAGNOSIS — Z1211 Encounter for screening for malignant neoplasm of colon: Secondary | ICD-10-CM

## 2021-01-23 DIAGNOSIS — Z111 Encounter for screening for respiratory tuberculosis: Secondary | ICD-10-CM | POA: Diagnosis not present

## 2021-01-23 NOTE — Progress Notes (Signed)
AortaScan < 3 cm. Within normal limits, per Dr McKeown. 

## 2021-01-24 LAB — IRON, TOTAL/TOTAL IRON BINDING CAP
%SAT: 16 % (calc) — ABNORMAL LOW (ref 20–48)
Iron: 48 ug/dL — ABNORMAL LOW (ref 50–180)
TIBC: 309 mcg/dL (calc) (ref 250–425)

## 2021-01-24 LAB — CBC WITH DIFFERENTIAL/PLATELET
Absolute Monocytes: 326 cells/uL (ref 200–950)
Basophils Absolute: 82 cells/uL (ref 0–200)
Basophils Relative: 1.7 %
Eosinophils Absolute: 216 cells/uL (ref 15–500)
Eosinophils Relative: 4.5 %
HCT: 36.7 % — ABNORMAL LOW (ref 38.5–50.0)
Hemoglobin: 12.2 g/dL — ABNORMAL LOW (ref 13.2–17.1)
Lymphs Abs: 2333 cells/uL (ref 850–3900)
MCH: 31.7 pg (ref 27.0–33.0)
MCHC: 33.2 g/dL (ref 32.0–36.0)
MCV: 95.3 fL (ref 80.0–100.0)
MPV: 9.5 fL (ref 7.5–12.5)
Monocytes Relative: 6.8 %
Neutro Abs: 1843 cells/uL (ref 1500–7800)
Neutrophils Relative %: 38.4 %
Platelets: 286 10*3/uL (ref 140–400)
RBC: 3.85 10*6/uL — ABNORMAL LOW (ref 4.20–5.80)
RDW: 13 % (ref 11.0–15.0)
Total Lymphocyte: 48.6 %
WBC: 4.8 10*3/uL (ref 3.8–10.8)

## 2021-01-24 LAB — COMPLETE METABOLIC PANEL WITH GFR
AG Ratio: 1.5 (calc) (ref 1.0–2.5)
ALT: 21 U/L (ref 9–46)
AST: 21 U/L (ref 10–35)
Albumin: 4.1 g/dL (ref 3.6–5.1)
Alkaline phosphatase (APISO): 61 U/L (ref 35–144)
BUN: 12 mg/dL (ref 7–25)
CO2: 28 mmol/L (ref 20–32)
Calcium: 9.5 mg/dL (ref 8.6–10.3)
Chloride: 104 mmol/L (ref 98–110)
Creat: 1.05 mg/dL (ref 0.70–1.25)
GFR, Est African American: 87 mL/min/{1.73_m2} (ref >=60)
GFR, Est Non African American: 75 mL/min/{1.73_m2} (ref >=60)
Globulin: 2.8 g/dL (calc) (ref 1.9–3.7)
Glucose, Bld: 89 mg/dL (ref 65–99)
Potassium: 4.8 mmol/L (ref 3.5–5.3)
Sodium: 139 mmol/L (ref 135–146)
Total Bilirubin: 0.3 mg/dL (ref 0.2–1.2)
Total Protein: 6.9 g/dL (ref 6.1–8.1)

## 2021-01-24 LAB — URINALYSIS, ROUTINE W REFLEX MICROSCOPIC
Bilirubin Urine: NEGATIVE
Glucose, UA: NEGATIVE
Hgb urine dipstick: NEGATIVE
Ketones, ur: NEGATIVE
Leukocytes,Ua: NEGATIVE
Nitrite: NEGATIVE
Protein, ur: NEGATIVE
Specific Gravity, Urine: 1.01 (ref 1.001–1.03)
pH: <=5 (ref 5.0–8.0)

## 2021-01-24 LAB — LIPID PANEL
Cholesterol: 157 mg/dL (ref <200)
HDL: 60 mg/dL (ref >=40)
LDL Cholesterol (Calc): 78 mg/dL (calc)
Non-HDL Cholesterol (Calc): 97 mg/dL (calc) (ref <130)
Total CHOL/HDL Ratio: 2.6 (calc) (ref <5.0)
Triglycerides: 105 mg/dL (ref <150)

## 2021-01-24 LAB — HEMOGLOBIN A1C
Hgb A1c MFr Bld: 5.8 % of total Hgb — ABNORMAL HIGH (ref <5.7)
Mean Plasma Glucose: 120 mg/dL
eAG (mmol/L): 6.6 mmol/L

## 2021-01-24 LAB — MICROALBUMIN / CREATININE URINE RATIO
Creatinine, Urine: 68 mg/dL (ref 20–320)
Microalb Creat Ratio: 3 mcg/mg creat (ref <30)
Microalb, Ur: 0.2 mg/dL

## 2021-01-24 LAB — INSULIN, RANDOM: Insulin: 2.7 u[IU]/mL

## 2021-01-24 LAB — PSA: PSA: 0.3 ng/mL (ref <=4.0)

## 2021-01-24 LAB — VITAMIN B12: Vitamin B-12: 547 pg/mL (ref 200–1100)

## 2021-01-24 LAB — VITAMIN D 25 HYDROXY (VIT D DEFICIENCY, FRACTURES): Vit D, 25-Hydroxy: 48 ng/mL (ref 30–100)

## 2021-01-24 LAB — MAGNESIUM: Magnesium: 2.2 mg/dL (ref 1.5–2.5)

## 2021-01-24 LAB — TSH: TSH: 1.35 mIU/L (ref 0.40–4.50)

## 2021-01-24 NOTE — Progress Notes (Signed)
============================================================ ============================================================  -    Iron is low -recommend that  you                                                                                           ou take an OTC Iron supplement 2 x /Levesque with meals  ============================================================ ============================================================  -  Vitamin B12 - level is Normal & OK  ============================================================ ============================================================  -  PSA - very Low - Great ! ============================================================ ============================================================  -  Total Chol = 157 -  Excellent - Keep up the [place] Work          !  - Very low risk for Heart Attack  / Stroke ============================================================ ============================================================  -  A1c = 58% - still in the early or borderline Diabetic Range    - Suggest Take Cinnamon 1,000 mg capsules 2 x /Hasley with meals                                                                                  to help lower blood sugar   - Best place to get best price is on Amazon  ============================================================ ============================================================  -  Vitamin D = 48 -0 Bad - Still way to low - Goal is between 70-100 , so. . . . .   - Recommend take  your vitamin D 2,000 caps &                                                                       take 3 caps = 6,000 units /[place]    ============================================================ ============================================================ All Else - CBC - Kidneys - Electrolytes - Liver - Magnesium & Thyroid    - all  Normal /  OK ============================================================ ============================================================  -  Keep up the [place] Work ! ============================================================ ============================================================          -

## 2021-02-22 ENCOUNTER — Ambulatory Visit: Payer: Managed Care, Other (non HMO) | Admitting: Adult Health Nurse Practitioner

## 2021-02-22 ENCOUNTER — Other Ambulatory Visit: Payer: Self-pay

## 2021-02-22 ENCOUNTER — Encounter: Payer: Self-pay | Admitting: Adult Health Nurse Practitioner

## 2021-02-22 VITALS — BP 124/82 | HR 64 | Temp 96.3°F | Wt 161.0 lb

## 2021-02-22 DIAGNOSIS — N451 Epididymitis: Secondary | ICD-10-CM | POA: Diagnosis not present

## 2021-02-22 DIAGNOSIS — Z79899 Other long term (current) drug therapy: Secondary | ICD-10-CM

## 2021-02-22 DIAGNOSIS — Z7689 Persons encountering health services in other specified circumstances: Secondary | ICD-10-CM | POA: Diagnosis not present

## 2021-02-22 DIAGNOSIS — Z202 Contact with and (suspected) exposure to infections with a predominantly sexual mode of transmission: Secondary | ICD-10-CM

## 2021-02-22 MED ORDER — DOXYCYCLINE HYCLATE 100 MG PO TABS: 100.0000 mg | ORAL_TABLET | Freq: Two times a day (BID) | ORAL | 0 refills | Status: AC

## 2021-02-22 NOTE — Addendum Note (Signed)
Addended byGarnet Sierras A on: 02/22/2021 05:17 PM   Modules accepted: Orders

## 2021-02-22 NOTE — Progress Notes (Signed)
ACUTE   Assessment and Plan:  Tony Ware was seen today for acute visit.  Diagnoses and all orders for this visit:  Epididymitis -     doxycycline (VIBRA-TABS) 100 MG tablet; Take 1 tablet (100 mg total) by mouth 2 (two) times daily for 10 days. Discussed medication and side effects of medications  Encounter for assessment of STD exposure -     C. trachomatis/N. gonorrhoeae RNA Discussed safe sexual practices.  Medication Management Continued  Further disposition pending results of labs. Discussed med's effects and SE's.   Over 30 minutes of face to face interview, exam, counseling, chart review, and critical decision making was performed.   Future Appointments  Date Time Provider [place]  08/07/2021  3:30 PM Liane Comber, NP [facility] None  01/31/2022 10:00 AM Unk Pinto, MD [facility] None    ------------------------------------------------------------------------------------------------------------------   HPI 65 y.o.male presents for evaluation after concern for exposure to partner with BV.  He reports new single sexual encounter 7 days ago.  Two Ambs later another sexual encounter different partner, but frequent encounters.  Reports she contact to let him know of diagnosis of BV,  He reports that he is having pain on the right side of his groin today.  He thinks he pulled a muscle.  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  Denies pain with urination, and penile discharge, open lesion or wounds or itching.        Past Medical History:  Diagnosis Date  . Labile hypertension    09/25/20- pt states no HTN  . Osteoarthritis   . Other testicular hypofunction   . Prediabetes      No Known Allergies  Current Outpatient Medications on File Prior to Visit  Medication Sig  . acetaminophen (TYLENOL) 325 MG tablet Take 650 mg by mouth every 6 (six) hours as needed.  Marland Kitchen acyclovir (ZOVIRAX) 400 MG tablet Take 1  tablet 2 x /Shawn to prevent fever blisters  . Ascorbic Acid (VITAMIN C PO) Take 1 tablet by mouth daily.  Marland Kitchen atorvastatin (LIPITOR) 80 MG tablet TAKE ONE TABLET BY MOUTH DAILY FOR CHOLESTEROL  . chlorhexidine (PERIDEX) 0.12 % solution 15 mLs 2 (two) times daily.  . Cholecalciferol (VITAMIN D PO) Take 2,000 Units by mouth daily.   Marland Kitchen ketoconazole (NIZORAL) 2 % cream Apply 1 application topically 2 (two) times daily.  Marland Kitchen OVER THE COUNTER MEDICATION Takes an OTC iron tablet daily.  . sildenafil (REVATIO) 20 MG tablet Take      1 to 5 tablets       /Goodell as needed for XXXX   No current facility-administered medications on file prior to visit.    ROS: all negative except above.   Physical Exam:  BP 124/82   Pulse 64   Temp (!) 96.3 F (35.7 C)   Wt 161 lb (73 kg)   SpO2 98%   BMI 22.77 kg/m   General Appearance: Well nourished, in no apparent distress. Respiratory: Respiratory effort normal, BS equal bilaterally without rales, rhonchi, wheezing or stridor.  Cardio: RRR with no MRGs. Brisk peripheral pulses without edema.  Abdomen: Soft, + BS.  Non tender, no guarding, rebound, hernias, masses. Lymphatics: Non tender without lymphadenopathy.  Musculoskeletal: Full ROM, 5/5 strength, normal gait.  Skin: Warm, dry without rashes, lesions, ecchymosis.  Neuro: Cranial nerves intact. Normal muscle tone, no cerebellar symptoms. Sensation intact.  Psych: Awake and oriented X 3, normal affect, Insight and Judgment  appropriate.   Genitals: Uncircumcised, no lesions, rashes, exudate.  Foreskin retracted, no erythema or discharge.  No hernias noted, bilateral. Tenderness to right spermatic cord at base & epididymis. Left side unremarkable, non tender.    Garnet Sierras, Laqueta Jean, DNP [facility] 02/22/2021  4:34 PM

## 2021-02-23 LAB — C. TRACHOMATIS/N. GONORRHOEAE RNA
C. trachomatis RNA, TMA: NOT DETECTED
N. gonorrhoeae RNA, TMA: NOT DETECTED

## 2021-03-21 ENCOUNTER — Other Ambulatory Visit: Payer: Self-pay | Admitting: Internal Medicine

## 2021-03-21 MED ORDER — SILDENAFIL CITRATE 20 MG PO TABS: ORAL_TABLET | ORAL | 0 refills | Status: DC

## 2021-08-06 NOTE — Progress Notes (Deleted)
6 MONTH FOLLOW UP  Assessment and Plan:    Tobacco use disorder Smoking cessation-  instruction/counseling given, counseled patient on the dangers of tobacco use, advised patient to stop smoking, and reviewed strategies to maximize success  Labile hypertension -     CBC with Differential/Platelet -     COMPLETE METABOLIC PANEL WITH GFR -     TSH  Hyperlipidemia, mixed -     TSH -     Lipid panel  Abnormal glucose -     Hemoglobin A1c       Continue diet and meds as discussed. Further disposition pending results of labs. Over 30 minutes of exam, counseling, chart review, and critical decision making was performed Future Appointments  Date Time Provider [place]  08/07/2021  3:30 PM Magda Bernheim, NP [facility] None  01/31/2022 10:00 AM Unk Pinto, MD [facility] None    HPI 65 y.o. AA male  presents for 6 month follow up on hypertension, cholesterol, prediabetes, and vitamin D deficiency.    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  He denies chest pain, shortness of breath, dizziness.  BMI is There is no height or weight on file to calculate BMI., he is working on diet and exercise, he has been trying to keep his weight down to help decrease pain in his right hip.  Wt Readings from Last 3 Encounters:  02/22/21 161 lb (73 kg)  01/23/21 162 lb (73.5 kg)  10/09/20 160 lb (72.6 kg)   Has started back to smoking, 1 pack last 3 days, will use nicotine patches and would like chantix to quit.  Last chest xray 2019. 15 year smoking history less than a pack a Koz[place]ski. Started back to smoking after he lost his daughter in 2013.     He is on cholesterol medication and denies myalgias. His cholesterol is at goal. The cholesterol last visit was:   Lab Results  Component Value Date   CHOL 157 01/23/2021   HDL 60 01/23/2021   LDLCALC 78 01/23/2021   TRIG 105 01/23/2021    CHOLHDL 2.6 01/23/2021    He has been working on diet and exercise for prediabetes, and denies paresthesia of the feet, polydipsia, polyuria and visual disturbances. Last A1C in the office was:  Lab Results  Component Value Date   HGBA1C 5.8 (H) 01/23/2021   Patient is on Vitamin D supplement.   Lab Results  Component Value Date   VD25OH 48 01/23/2021      Current Medications:    Current Outpatient Medications (Cardiovascular):    atorvastatin (LIPITOR) 80 MG tablet, TAKE ONE TABLET BY MOUTH DAILY FOR CHOLESTEROL   sildenafil (REVATIO) 20 MG tablet, Take      1 to 5 tablets       /Norden as needed for XXXX   Current Outpatient Medications (Analgesics):    acetaminophen (TYLENOL) 325 MG tablet, Take 650 mg by mouth every 6 (six) hours as needed.   Current Outpatient Medications (Other):    acyclovir (ZOVIRAX) 400 MG tablet, Take 1 tablet 2 x /[place] to prevent fever blisters   Ascorbic Acid (VITAMIN C PO), Take 1 tablet by mouth daily.   chlorhexidine (PERIDEX) 0.12 % solution, 15 mLs 2 (two) times daily.   Cholecalciferol (VITAMIN D PO), Take 2,000 Units by mouth daily.    ketoconazole (NIZORAL) 2 %  cream, Apply 1 application topically 2 (two) times daily.   OVER THE COUNTER MEDICATION, Takes an OTC iron tablet daily.  Medical History:  Past Medical History:  Diagnosis Date   Labile hypertension    09/25/20- pt states no HTN   Osteoarthritis    Other testicular hypofunction    Prediabetes    Allergies: No Known Allergies   Review of Systems:  Review of Systems  Constitutional: Negative.  Negative for chills, fever, malaise/fatigue and weight loss.  HENT:  Negative for congestion, ear discharge, ear pain, hearing loss, nosebleeds, sore throat and tinnitus.   Eyes: Negative.  Negative for blurred vision and double vision.  Respiratory: Negative.  Negative for cough, shortness of breath and stridor.   Cardiovascular: Negative.  Negative for chest pain, palpitations,  orthopnea and leg swelling.  Gastrointestinal:  Negative for abdominal pain, blood in stool, constipation, diarrhea, heartburn, melena, nausea and vomiting.  Genitourinary: Negative.   Musculoskeletal:  Negative for falls, joint pain and myalgias.  Skin: Negative.  Negative for rash.       Bump on back  Neurological: Negative.  Negative for dizziness, tingling, tremors, loss of consciousness, weakness and headaches.  Endo/Heme/Allergies: Negative.   Psychiatric/Behavioral: Negative.  Negative for depression, memory loss and suicidal ideas.    Family history- Review and unchanged Social history- Review and unchanged Physical Exam: There were no vitals taken for this visit. Wt Readings from Last 3 Encounters:  02/22/21 161 lb (73 kg)  01/23/21 162 lb (73.5 kg)  10/09/20 160 lb (72.6 kg)   General Appearance: Well nourished, in no apparent distress. Eyes: PERRLA, EOMs, conjunctiva no swelling or erythema Sinuses: No Frontal/maxillary tenderness ENT/Mouth: Ext aud canals clear, TMs without erythema, bulging. No erythema, swelling, or exudate on post pharynx.  Tonsils not swollen or erythematous. Hearing normal.  Neck: Supple, thyroid normal.  Respiratory: Respiratory effort normal, BS equal bilaterally without rales, rhonchi, wheezing or stridor.  Cardio: RRR with no MRGs. Brisk peripheral pulses without edema.  Abdomen: Soft, + BS,  Non tender, no guarding, rebound, hernias, masses. Lymphatics: Non tender without lymphadenopathy.  Musculoskeletal: Full ROM, 5/5 strength,  Strength is normal and symmetric in arms. Skin: upper back right of midline with seb cyst 2 cm, no redness, no warmth, Warm, dry without rashes, lesions, ecchymosis.  Neuro: Cranial nerves intact. Normal muscle tone, no cerebellar symptoms. Psych: Awake and oriented X 3, normal affect, Insight and Judgment appropriate.         Magda Bernheim, NP 1:36 PM [facility]

## 2021-08-07 ENCOUNTER — Ambulatory Visit: Payer: Managed Care, Other (non HMO) | Admitting: Nurse Practitioner

## 2021-08-07 DIAGNOSIS — F17200 Nicotine dependence, unspecified, uncomplicated: Secondary | ICD-10-CM

## 2021-08-07 DIAGNOSIS — Z79899 Other long term (current) drug therapy: Secondary | ICD-10-CM

## 2021-08-07 DIAGNOSIS — E782 Mixed hyperlipidemia: Secondary | ICD-10-CM

## 2021-08-07 DIAGNOSIS — R0989 Other specified symptoms and signs involving the circulatory and respiratory systems: Secondary | ICD-10-CM

## 2021-08-07 DIAGNOSIS — E559 Vitamin D deficiency, unspecified: Secondary | ICD-10-CM

## 2021-08-07 DIAGNOSIS — R7309 Other abnormal glucose: Secondary | ICD-10-CM

## 2021-08-07 DIAGNOSIS — Z1329 Encounter for screening for other suspected endocrine disorder: Secondary | ICD-10-CM

## 2021-08-15 ENCOUNTER — Other Ambulatory Visit: Payer: Self-pay

## 2021-08-15 ENCOUNTER — Ambulatory Visit: Payer: Managed Care, Other (non HMO) | Admitting: Internal Medicine

## 2021-08-15 ENCOUNTER — Encounter: Payer: Self-pay | Admitting: Internal Medicine

## 2021-08-15 VITALS — BP 120/80 | HR 70 | Temp 97.8°F | Resp 16 | Ht 70.5 in | Wt 155.0 lb

## 2021-08-15 DIAGNOSIS — N390 Urinary tract infection, site not specified: Secondary | ICD-10-CM

## 2021-08-15 DIAGNOSIS — D573 Sickle-cell trait: Secondary | ICD-10-CM | POA: Diagnosis not present

## 2021-08-15 MED ORDER — DOXYCYCLINE HYCLATE 100 MG PO CAPS: ORAL_CAPSULE | ORAL | 0 refills | Status: DC

## 2021-08-15 NOTE — Progress Notes (Signed)
    Future Appointments  Date Time Provider [place]  08/15/2021  4:30 PM Unk Pinto, MD [facility] None  08/27/2021  4:00 PM Magda Bernheim, NP [facility] None  01/31/2022 10:00 AM Unk Pinto, MD [facility] None   History of Present Illness:     Patient is a very nice 65 yo BM presenting with 3 Hannis hx/o Urinary burning and difficulty voiding with slow stream . No fever chills , sweats rashes. Denies penile discharges.   Medications    atorvastatin (LIPITOR) 80 MG tablet, TAKE ONE TABLET BY MOUTH DAILY FOR CHOLESTEROL   sildenafil (REVATIO) 20 MG tablet, Take      1 to 5 tablets       /[place] as needed for XXXX   acetaminophen (TYLENOL) 325 MG tablet, Take 650 mg by mouth every 6 (six) hours as needed.   acyclovir (ZOVIRAX) 400 MG tablet, Take 1 tablet 2 x /Dimitrov to prevent fever blisters   Ascorbic Acid (VITAMIN C PO), Take 1 tablet by mouth daily.   chlorhexidine (PERIDEX) 0.12 % solution, 15 mLs 2 (two) times daily.   Cholecalciferol (VITAMIN D PO), Take 2,000 Units by mouth daily.    ketoconazole (NIZORAL) 2 % cream, Apply 1 application topically 2 (two) times daily.   OVER THE COUNTER MEDICATION, Takes an OTC iron tablet daily.  Problem list He has SICKLE CELL TRAIT; Anemia of chronic disease; SMOKER; Labile hypertension; Osteoarthritis; Other abnormal glucose (prediabetes); Testosterone deficiency; Hyperlipidemia; Vitamin D deficiency; and Medication management on their problem list.   Observations/Objective:  BP 120/80   Pulse 70   Temp 97.8 F (36.6 C)   Resp 16   Ht 5' 10.5" (1.791 m)   Wt 155 lb (70.3 kg)   SpO2 97%   BMI 21.93 kg/m   HEENT - WNL. Neck - supple.  Chest - Clear. Cor - Nl HS. RRR w/o sig murmur GU - Prostate 1+ , soft , boggy & tender.  MS- FROM w/o deformities.  Gait Nl. Neuro -  Nl w/o focal abnormalities.  Assessment and Plan:  1. Urinary tract infection without hematuria  - Urinalysis, Routine w reflex microscopic - Urine  Culture  - doxycycline 100 MG capsule;  Take 1 capsule 2 x /[place]il with Meals for Infection   Dispense: 30 capsule  Follow Up Instructions:        I discussed the assessment and treatment plan with the patient. The patient was provided an opportunity to ask questions and all were answered. The patient agreed with the plan and demonstrated an understanding of the instructions.       The patient was advised to call back or seek an in-person evaluation if the symptoms worsen or if the condition fails to improve as anticipated.   Kirtland Bouchard, MD

## 2021-08-16 LAB — URINALYSIS, ROUTINE W REFLEX MICROSCOPIC
Bilirubin Urine: NEGATIVE
Glucose, UA: NEGATIVE
Hgb urine dipstick: NEGATIVE
Ketones, ur: NEGATIVE
Leukocytes,Ua: NEGATIVE
Nitrite: NEGATIVE
Protein, ur: NEGATIVE
Specific Gravity, Urine: 1.008 (ref 1.001–1.035)
pH: 6 (ref 5.0–8.0)

## 2021-08-16 LAB — URINE CULTURE
MICRO NUMBER:: 12434794
Result:: NO GROWTH
SPECIMEN QUALITY:: ADEQUATE

## 2021-08-16 NOTE — Progress Notes (Signed)
============================================================ ============================================================  -     U/A - is Normal & OK  &   - Culture shows No Infection ============================================================ ============================================================

## 2021-08-20 NOTE — Progress Notes (Signed)
PATIENT IS AWARE OF LAB RESULTS. [facility]

## 2021-08-24 NOTE — Progress Notes (Signed)
6 MONTH FOLLOW UP  Assessment and Plan:    Tony Ware was seen today for follow-up.  Diagnoses and all orders for this visit:  Labile hypertension -     CBC with Differential/Platelet - Controlled without medication, DASH diet, exercise and monitor at home. Call if greater than 130/80.    Hyperlipidemia, mixed -     COMPLETE METABOLIC PANEL WITH GFR -     Lipid panel -     TSH       - Continue Lipitor and diet low in saturated fats, continue exercise  Abnormal glucose -     Hemoglobin A1c  Continue diet and exercise  Vitamin D deficiency -     VITAMIN D 25 Hydroxy (Vit-D Deficiency, Fractures) - Continue Vit D supplementation  Screening for hematuria or proteinuria -     Microalbumin / creatinine urine ratio     Continue diet and meds as discussed. Further disposition pending results of labs. Over 30 minutes of exam, counseling, chart review, and critical decision making was performed Future Appointments  Date Time Provider [place]  01/31/2022 10:00 AM Unk Pinto, MD [facility] None    HPI 65 y.o. AA male  presents for 6 month follow up on hypertension, cholesterol, prediabetes, and vitamin D deficiency.    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  He denies chest pain, shortness of breath, dizziness.  BMI is Body mass index is 22.35 kg/m., he is working on diet and exercise, he has been trying to keep his weight down to help decrease pain in his right hip.  Wt Readings from Last 3 Encounters:  08/27/21 158 lb (71.7 kg)  08/15/21 155 lb (70.3 kg)  02/22/21 161 lb (73 kg)   Has started back to smoking, 7-8 cigarettes a Daggs. Last chest xray 2019. 15 year smoking history less than a pack a Zirbes. Started back to smoking after he lost his daughter in 2013.     He is not on cholesterol medication and denies myalgias. His cholesterol is at goal.  The cholesterol last visit was:   Lab Results  Component Value Date   CHOL 157 01/23/2021   HDL 60 01/23/2021   LDLCALC 78 01/23/2021   TRIG 105 01/23/2021   CHOLHDL 2.6 01/23/2021    He has been working on diet and exercise for prediabetes, and denies paresthesia of the feet, polydipsia, polyuria and visual disturbances. Last A1C in the office was:  Lab Results  Component Value Date   HGBA1C 5.8 (H) 01/23/2021   Patient is on Vitamin D supplement.   Lab Results  Component Value Date   VD25OH 48 01/23/2021      Current Medications:    Current Outpatient Medications (Cardiovascular):    atorvastatin (LIPITOR) 80 MG tablet, TAKE ONE TABLET BY MOUTH DAILY FOR CHOLESTEROL   sildenafil (REVATIO) 20 MG tablet, Take      1 to 5 tablets       /[place] as needed for XXXX   Current Outpatient Medications (Analgesics):    acetaminophen (TYLENOL) 325 MG tablet, Take 650 mg by mouth every 6 (six) hours as needed.   Current Outpatient Medications (Other):    acyclovir (ZOVIRAX) 400 MG tablet, Take 1 tablet 2 x /Strider to prevent fever blisters   Ascorbic Acid (VITAMIN C PO), Take 1 tablet by mouth daily.  chlorhexidine (PERIDEX) 0.12 % solution, 15 mLs 2 (two) times daily.   Cholecalciferol (VITAMIN D PO), Take 2,000 Units by mouth daily.    ketoconazole (NIZORAL) 2 % cream, Apply 1 application topically 2 (two) times daily.   OVER THE COUNTER MEDICATION, Takes an OTC iron tablet daily.   doxycycline (VIBRAMYCIN) 100 MG capsule, Take 1 capsule 2 x /[place] with Meals for Infection (Patient not taking: Reported on 08/27/2021)  Medical History:  Past Medical History:  Diagnosis Date   Labile hypertension    09/25/20- pt states no HTN   Osteoarthritis    Other testicular hypofunction    Prediabetes    Allergies: No Known Allergies   Review of Systems:  Review of Systems  Constitutional: Negative.  Negative for chills, fever, malaise/fatigue and weight loss.  HENT:  Negative for congestion,  ear discharge, ear pain, hearing loss, nosebleeds, sore throat and tinnitus.   Eyes: Negative.  Negative for blurred vision and double vision.  Respiratory: Negative.  Negative for cough, shortness of breath and stridor.   Cardiovascular: Negative.  Negative for chest pain, palpitations, orthopnea and leg swelling.  Gastrointestinal:  Negative for abdominal pain, blood in stool, constipation, diarrhea, heartburn, melena, nausea and vomiting.  Genitourinary: Negative.   Musculoskeletal:  Negative for falls, joint pain and myalgias.  Skin: Negative.  Negative for rash.  Neurological: Negative.  Negative for dizziness, tingling, tremors, loss of consciousness, weakness and headaches.  Endo/Heme/Allergies: Negative.   Psychiatric/Behavioral: Negative.  Negative for depression, memory loss and suicidal ideas.    Family history- Review and unchanged Social history- Review and unchanged Physical Exam: BP (!) 102/58   Pulse 67   Temp 97.7 F (36.5 C)   Wt 158 lb (71.7 kg)   SpO2 98%   BMI 22.35 kg/m  Wt Readings from Last 3 Encounters:  08/27/21 158 lb (71.7 kg)  08/15/21 155 lb (70.3 kg)  02/22/21 161 lb (73 kg)   General Appearance: Pleasant thin male, in no apparent distress. Eyes: PERRLA, EOMs, conjunctiva no swelling or erythema Sinuses: No Frontal/maxillary tenderness ENT/Mouth: Ext aud canals clear, TMs without erythema, bulging. No erythema, swelling, or exudate on post pharynx.  Tonsils not swollen or erythematous. Hearing normal.  Neck: Supple, thyroid normal.  Respiratory: Respiratory effort normal, BS equal bilaterally without rales, rhonchi, wheezing or stridor.  Cardio: RRR with no MRGs. Brisk peripheral pulses without edema.  Abdomen: Soft, + BS,  Non tender, no guarding, rebound, hernias, masses. Lymphatics: Non tender without lymphadenopathy.  Musculoskeletal: Full ROM, 5/5 strength,  Strength is normal and symmetric in arms. Skin:  Warm, dry without rashes, lesions,  ecchymosis.  Neuro: Cranial nerves intact. Normal muscle tone, no cerebellar symptoms. Psych: Awake and oriented X 3, normal affect, Insight and Judgment appropriate.       Magda Bernheim, NP 4:17 PM [facility]

## 2021-08-27 ENCOUNTER — Encounter: Payer: Self-pay | Admitting: Nurse Practitioner

## 2021-08-27 ENCOUNTER — Other Ambulatory Visit: Payer: Self-pay

## 2021-08-27 ENCOUNTER — Ambulatory Visit: Payer: Managed Care, Other (non HMO) | Admitting: Nurse Practitioner

## 2021-08-27 VITALS — BP 102/58 | HR 67 | Temp 97.7°F | Wt 158.0 lb

## 2021-08-27 DIAGNOSIS — D573 Sickle-cell trait: Secondary | ICD-10-CM

## 2021-08-27 DIAGNOSIS — E559 Vitamin D deficiency, unspecified: Secondary | ICD-10-CM

## 2021-08-27 DIAGNOSIS — R7309 Other abnormal glucose: Secondary | ICD-10-CM

## 2021-08-27 DIAGNOSIS — K219 Gastro-esophageal reflux disease without esophagitis: Secondary | ICD-10-CM

## 2021-08-27 DIAGNOSIS — R0989 Other specified symptoms and signs involving the circulatory and respiratory systems: Secondary | ICD-10-CM

## 2021-08-27 DIAGNOSIS — Z1389 Encounter for screening for other disorder: Secondary | ICD-10-CM

## 2021-08-27 DIAGNOSIS — E782 Mixed hyperlipidemia: Secondary | ICD-10-CM | POA: Diagnosis not present

## 2021-08-27 NOTE — Patient Instructions (Signed)
GENERAL HEALTH GOALS   Know what a healthy weight is for you (roughly BMI <25) and aim to maintain this   Aim for 7+ servings of fruits and vegetables daily   70-80+ fluid ounces of water or unsweet tea for healthy kidneys   Limit to max 1 drink of alcohol per [place]; avoid smoking/tobacco   Limit animal fats in diet for cholesterol and heart health - choose grass fed whenever available   Avoid highly processed foods, and foods high in saturated/trans fats   Aim for low stress - take time to unwind and care for your mental health   Aim for 150 min of moderate intensity exercise weekly for heart health, and weights twice weekly for bone health   Aim for 7-9 hours of sleep daily  

## 2021-08-28 LAB — HEMOGLOBIN A1C
Hgb A1c MFr Bld: 5.7 % of total Hgb — ABNORMAL HIGH (ref <5.7)
Mean Plasma Glucose: 117 mg/dL
eAG (mmol/L): 6.5 mmol/L

## 2021-08-28 LAB — LIPID PANEL
Cholesterol: 154 mg/dL (ref <200)
HDL: 70 mg/dL (ref >=40)
LDL Cholesterol (Calc): 58 mg/dL (calc)
Non-HDL Cholesterol (Calc): 84 mg/dL (calc) (ref <130)
Total CHOL/HDL Ratio: 2.2 (calc) (ref <5.0)
Triglycerides: 187 mg/dL — ABNORMAL HIGH (ref <150)

## 2021-08-28 LAB — CBC WITH DIFFERENTIAL/PLATELET
Absolute Monocytes: 408 cells/uL (ref 200–950)
Basophils Absolute: 58 cells/uL (ref 0–200)
Basophils Relative: 1.2 %
Eosinophils Absolute: 197 cells/uL (ref 15–500)
Eosinophils Relative: 4.1 %
HCT: 35.6 % — ABNORMAL LOW (ref 38.5–50.0)
Hemoglobin: 12 g/dL — ABNORMAL LOW (ref 13.2–17.1)
Lymphs Abs: 2362 cells/uL (ref 850–3900)
MCH: 31.9 pg (ref 27.0–33.0)
MCHC: 33.7 g/dL (ref 32.0–36.0)
MCV: 94.7 fL (ref 80.0–100.0)
MPV: 9.6 fL (ref 7.5–12.5)
Monocytes Relative: 8.5 %
Neutro Abs: 1776 cells/uL (ref 1500–7800)
Neutrophils Relative %: 37 %
Platelets: 277 10*3/uL (ref 140–400)
RBC: 3.76 10*6/uL — ABNORMAL LOW (ref 4.20–5.80)
RDW: 13 % (ref 11.0–15.0)
Total Lymphocyte: 49.2 %
WBC: 4.8 10*3/uL (ref 3.8–10.8)

## 2021-08-28 LAB — COMPLETE METABOLIC PANEL WITH GFR
AG Ratio: 1.3 (calc) (ref 1.0–2.5)
ALT: 20 U/L (ref 9–46)
AST: 24 U/L (ref 10–35)
Albumin: 3.9 g/dL (ref 3.6–5.1)
Alkaline phosphatase (APISO): 52 U/L (ref 35–144)
BUN: 11 mg/dL (ref 7–25)
CO2: 30 mmol/L (ref 20–32)
Calcium: 9.8 mg/dL (ref 8.6–10.3)
Chloride: 104 mmol/L (ref 98–110)
Creat: 1.1 mg/dL (ref 0.70–1.35)
Globulin: 3 g/dL (calc) (ref 1.9–3.7)
Glucose, Bld: 76 mg/dL (ref 65–99)
Potassium: 4.6 mmol/L (ref 3.5–5.3)
Sodium: 141 mmol/L (ref 135–146)
Total Bilirubin: 0.3 mg/dL (ref 0.2–1.2)
Total Protein: 6.9 g/dL (ref 6.1–8.1)
eGFR: 74 mL/min/{1.73_m2} (ref >=60)

## 2021-08-28 LAB — TSH: TSH: 2.48 mIU/L (ref 0.40–4.50)

## 2021-08-28 LAB — MICROALBUMIN / CREATININE URINE RATIO
Creatinine, Urine: 78 mg/dL (ref 20–320)
Microalb Creat Ratio: 4 mcg/mg creat (ref <30)
Microalb, Ur: 0.3 mg/dL

## 2021-08-28 LAB — VITAMIN D 25 HYDROXY (VIT D DEFICIENCY, FRACTURES): Vit D, 25-Hydroxy: 51 ng/mL (ref 30–100)

## 2021-12-05 ENCOUNTER — Other Ambulatory Visit: Payer: Self-pay | Admitting: Internal Medicine

## 2021-12-05 MED ORDER — ACYCLOVIR 400 MG PO TABS: ORAL_TABLET | ORAL | 3 refills | Status: DC

## 2021-12-05 MED ORDER — SILDENAFIL CITRATE 20 MG PO TABS: ORAL_TABLET | ORAL | 0 refills | Status: DC

## 2021-12-06 ENCOUNTER — Other Ambulatory Visit: Payer: Self-pay | Admitting: Internal Medicine

## 2021-12-06 MED ORDER — ATORVASTATIN CALCIUM 80 MG PO TABS: ORAL_TABLET | ORAL | 3 refills | Status: DC

## 2022-01-30 NOTE — Progress Notes (Signed)
Complete Physical  Assessment and Plan:   Tony Ware was seen today for annual exam.  Diagnoses and all orders for this visit:  Encounter for general adult medical examination with abnormal findings Due yearly  Labile hypertension Currently well controlled without medication Continue DASH diet, monitor BP and notify office if BP consistently running greater than 130/80 Go to the ER if any chest pain, shortness of breath, nausea, dizziness, severe HA, changes vision/speech   Hyperlipidemia, mixed Continue atorvastatin, low saturated fat diet and exercise -     COMPLETE METABOLIC PANEL WITH GFR -     Lipid panel -     TSH  Abnormal glucose Continue diet and exercise -     Hemoglobin A1c  Vitamin D deficiency -     VITAMIN D 25 Hydroxy (Vit-D Deficiency, Fractures)  Medication management -     Magnesium  BPH with obstruction/lower urinary tract symptoms Getting up once a night  Continue to monitor -     PSA  Sickle-cell trait (HCC) Monitor -     CBC with Differential/Platelet -     Iron, Total/Total Iron Binding Cap -     Ferritin  Screening for ischemic heart disease -     EKG 12-Lead  Screening for hematuria or proteinuria -     Urinalysis, Routine w reflex microscopic -     Microalbumin / creatinine urine ratio  Screening for thyroid disorder -     TSH  BMI 22.0-22.9, adult Continue caloric dense foods and increase protein  Acute midline low back pain without sciatica -     predniSONE (DELTASONE) 20 MG tablet; 3 tablets daily with food for 3 days, 2 tabs daily for 3 days, 1 tab a Zerbe for 5 days. Continue Lidoderm patch and heat, if no improvement notify the office    Discussed med's effects and SE's. Screening labs and tests as requested with regular follow-up as recommended. Over 40 minutes of exam, counseling, chart review and critical decision making was performed  HPI Patient presents for a complete physical. has SICKLE CELL TRAIT; Anemia of chronic  disease; SMOKER; Labile hypertension; Osteoarthritis; Other abnormal glucose (prediabetes); Testosterone deficiency; Hyperlipidemia; Vitamin D deficiency; and Medication management on their problem list.   He was opening his truck door and tweaked his lower back . He has been using Lidoderm patches.   He is having issues with his allergies.   His blood pressure has been controlled at home, today their BP is BP: 100/60  BP Readings from Last 3 Encounters:  01/31/22 100/60  08/27/21 (!) 102/58  08/15/21 120/80   BMI is Body mass index is 22.96 kg/m., he has been working on diet and exercise. Wt Readings from Last 3 Encounters:  01/31/22 160 lb (72.6 kg)  08/27/21 158 lb (71.7 kg)  08/15/21 155 lb (70.3 kg)     He does workout. He denies chest pain, shortness of breath, dizziness.  He is on cholesterol medication , Atorvastatin 80 mg QD and denies myalgias. His cholesterol is at goal. The cholesterol last visit was:   Lab Results  Component Value Date   CHOL 154 08/27/2021   HDL 70 08/27/2021   LDLCALC 58 08/27/2021   TRIG 187 (H) 08/27/2021   CHOLHDL 2.2 08/27/2021   He has been working on diet and exercise for abnormal glucose, he is not on bASA, he is not on ACE/ARB a. Last A1C in the office was:  Lab Results  Component Value Date   HGBA1C 5.7 (  H) 08/27/2021    Lab Results  Component Value Date   EGFR 74 08/27/2021     Patient is on Vitamin D supplement.   Lab Results  Component Value Date   VD25OH 51 08/27/2021     Last PSA was: Lab Results  Component Value Date   PSA 0.30 01/23/2021    Current Medications:  Current Outpatient Medications on File Prior to Visit  Medication Sig Dispense Refill   acetaminophen (TYLENOL) 325 MG tablet Take 650 mg by mouth every 6 (six) hours as needed.     acyclovir (ZOVIRAX) 400 MG tablet Take 1 tablet 2 x /Humann to prevent fever blisters 180 tablet 3   Ascorbic Acid (VITAMIN C PO) Take 1 tablet by mouth daily.     atorvastatin  (LIPITOR) 80 MG tablet Take  1 tablet  Daily  for Cholesterol to Prevent Heart Attack, Strokes, Impotence  & Dementia 90 tablet 3   Cholecalciferol (VITAMIN D PO) Take 2,000 Units by mouth daily.      OVER THE COUNTER MEDICATION Takes an OTC iron tablet daily.     chlorhexidine (PERIDEX) 0.12 % solution 15 mLs 2 (two) times daily.     ketoconazole (NIZORAL) 2 % cream Apply 1 application topically 2 (two) times daily. (Patient not taking: Reported on 01/31/2022) 30 g 3   sildenafil (REVATIO) 20 MG tablet Take      1 to 5 tablets       /[place] as needed for XXXX (Patient not taking: Reported on 01/31/2022) 30 tablet 0   No current facility-administered medications on file prior to visit.   Allergies:  No Known Allergies Health Maintenance:  Immunization History  Administered Date(s) Administered   Influenza Inj Mdck Quad With Preservative 09/29/2019   PPD Test 06/12/2017, 08/24/2018, 09/29/2019, 01/23/2021   Td 11/18/2004   Tdap 12/25/2015   Health Maintenance  Topic Date Due   COVID-19 Vaccine (1) Never done   Pneumonia Vaccine 34+ Years old (1 - PCV) Never done   HIV Screening  Never done   Hepatitis C Screening  Never done   Zoster Vaccines- Shingrix (1 of 2) Never done   INFLUENZA VACCINE  06/18/2021   TETANUS/TDAP  12/24/2025   COLONOSCOPY (Pts 45-81yrs Insurance coverage will need to be confirmed)  10/10/2027   HPV VACCINES  Aged Out     Patient Care Team: Lucky Cowboy, MD as PCP - General (Internal Medicine)  Medical History:  has SICKLE CELL TRAIT; Anemia of chronic disease; SMOKER; Labile hypertension; Osteoarthritis; Other abnormal glucose (prediabetes); Testosterone deficiency; Hyperlipidemia; Vitamin D deficiency; and Medication management on their problem list. Surgical History:  He  has a past surgical history that includes Hernia repair; Thulium laser TURP (transurethral resection of prostate) (N/A, 01/05/2019); and Colonoscopy (06/2010). Family History:  His  family history includes Cancer in his father; Colon cancer in his father; Colon polyps in his father; Diabetes in his mother; Hypertension in his mother. Social History:   reports that he has been smoking cigarettes. He has a 10.25 pack-year smoking history. He has never used smokeless tobacco. He reports current alcohol use of about 2.0 standard drinks per week. He reports that he does not use drugs. Review of Systems:  Review of Systems  Constitutional:  Negative for chills and fever.  HENT:  Positive for congestion. Negative for hearing loss, sinus pain, sore throat and tinnitus.   Eyes:  Negative for blurred vision and double vision.  Respiratory:  Negative for cough, hemoptysis, sputum  production, shortness of breath and wheezing.   Cardiovascular:  Negative for chest pain, palpitations and leg swelling.  Gastrointestinal:  Negative for abdominal pain, constipation, diarrhea, heartburn, nausea and vomiting.  Genitourinary:  Negative for dysuria and urgency.  Musculoskeletal:  Positive for back pain and joint pain (left middle finger). Negative for falls, myalgias and neck pain.  Skin:  Negative for rash.  Neurological:  Negative for dizziness, tingling, tremors, weakness and headaches.  Endo/Heme/Allergies:  Does not bruise/bleed easily.  Psychiatric/Behavioral:  Negative for depression and suicidal ideas. The patient is not nervous/anxious and does not have insomnia.    Physical Exam: Estimated body mass index is 22.96 kg/m as calculated from the following:   Height as of this encounter: 5\' 10"  (1.778 m).   Weight as of this encounter: 160 lb (72.6 kg). BP 100/60   Pulse 68   Temp (!) 96.4 F (35.8 C)   Ht 5\' 10"  (1.778 m)   Wt 160 lb (72.6 kg)   SpO2 96%   BMI 22.96 kg/m  General Appearance: Very pleasant thin male, in no apparent distress.  Eyes: PERRLA, EOMs, conjunctiva no swelling or erythema, normal fundi and vessels.  Sinuses: No Frontal/maxillary tenderness   ENT/Mouth: Ext aud canals clear, normal light reflex with TMs without erythema, bulging. Good dentition. No erythema, swelling, or exudate on post pharynx. Tonsils not swollen or erythematous. Hearing normal.  Neck: Supple, thyroid normal. No bruits  Respiratory: Respiratory effort normal, BS equal bilaterally without rales, rhonchi, wheezing or stridor.  Cardio: RRR without murmurs, rubs or gallops. Brisk peripheral pulses without edema.  Chest: symmetric, with normal excursions and percussion.  Abdomen: Soft, nontender, no guarding, rebound, hernias, masses, or organomegaly.  Lymphatics: Non tender without lymphadenopathy.  Genitourinary:  Musculoskeletal: Full ROM all peripheral extremities,5/5 strength, and normal gait. Mild tenderness midline lower back Skin: Warm, dry without rashes, lesions, ecchymosis. Neuro: Cranial nerves intact, reflexes equal bilaterally. Normal muscle tone, no cerebellar symptoms. Sensation intact.  Psych: Awake and oriented X 3, normal affect, Insight and Judgment appropriate.   EKG: NSR, no ST changes.   Arielle Eber W Zaylan Kissoon 3:04 PM [facility]

## 2022-01-31 ENCOUNTER — Ambulatory Visit: Payer: Managed Care, Other (non HMO) | Admitting: Nurse Practitioner

## 2022-01-31 ENCOUNTER — Other Ambulatory Visit: Payer: Self-pay

## 2022-01-31 ENCOUNTER — Encounter: Payer: Self-pay | Admitting: Nurse Practitioner

## 2022-01-31 ENCOUNTER — Encounter: Payer: Managed Care, Other (non HMO) | Admitting: Internal Medicine

## 2022-01-31 VITALS — BP 100/60 | HR 68 | Temp 96.4°F | Ht 70.0 in | Wt 160.0 lb

## 2022-01-31 DIAGNOSIS — Z0001 Encounter for general adult medical examination with abnormal findings: Secondary | ICD-10-CM

## 2022-01-31 DIAGNOSIS — R0989 Other specified symptoms and signs involving the circulatory and respiratory systems: Secondary | ICD-10-CM

## 2022-01-31 DIAGNOSIS — N401 Enlarged prostate with lower urinary tract symptoms: Secondary | ICD-10-CM | POA: Diagnosis not present

## 2022-01-31 DIAGNOSIS — Z125 Encounter for screening for malignant neoplasm of prostate: Secondary | ICD-10-CM

## 2022-01-31 DIAGNOSIS — Z136 Encounter for screening for cardiovascular disorders: Secondary | ICD-10-CM | POA: Diagnosis not present

## 2022-01-31 DIAGNOSIS — Z130 Encounter for screening for diseases of the blood and blood-forming organs and certain disorders involving the immune mechanism: Secondary | ICD-10-CM | POA: Diagnosis not present

## 2022-01-31 DIAGNOSIS — Z13220 Encounter for screening for lipoid disorders: Secondary | ICD-10-CM | POA: Diagnosis not present

## 2022-01-31 DIAGNOSIS — E782 Mixed hyperlipidemia: Secondary | ICD-10-CM

## 2022-01-31 DIAGNOSIS — R300 Dysuria: Secondary | ICD-10-CM

## 2022-01-31 DIAGNOSIS — Z0000 Encounter for general adult medical examination without abnormal findings: Secondary | ICD-10-CM | POA: Diagnosis not present

## 2022-01-31 DIAGNOSIS — Z1329 Encounter for screening for other suspected endocrine disorder: Secondary | ICD-10-CM

## 2022-01-31 DIAGNOSIS — Z79899 Other long term (current) drug therapy: Secondary | ICD-10-CM

## 2022-01-31 DIAGNOSIS — E559 Vitamin D deficiency, unspecified: Secondary | ICD-10-CM | POA: Diagnosis not present

## 2022-01-31 DIAGNOSIS — R7309 Other abnormal glucose: Secondary | ICD-10-CM

## 2022-01-31 DIAGNOSIS — D573 Sickle-cell trait: Secondary | ICD-10-CM

## 2022-01-31 DIAGNOSIS — Z1389 Encounter for screening for other disorder: Secondary | ICD-10-CM

## 2022-01-31 DIAGNOSIS — N138 Other obstructive and reflux uropathy: Secondary | ICD-10-CM

## 2022-01-31 DIAGNOSIS — Z131 Encounter for screening for diabetes mellitus: Secondary | ICD-10-CM

## 2022-01-31 MED ORDER — PREDNISONE 20 MG PO TABS: ORAL_TABLET | ORAL | 0 refills | Status: AC

## 2022-02-01 LAB — URINALYSIS, ROUTINE W REFLEX MICROSCOPIC
Bilirubin Urine: NEGATIVE
Glucose, UA: NEGATIVE
Hgb urine dipstick: NEGATIVE
Ketones, ur: NEGATIVE
Leukocytes,Ua: NEGATIVE
Nitrite: NEGATIVE
Protein, ur: NEGATIVE
Specific Gravity, Urine: 1.01 (ref 1.001–1.035)
pH: 6.5 (ref 5.0–8.0)

## 2022-02-01 LAB — COMPLETE METABOLIC PANEL WITH GFR
AG Ratio: 1.2 (calc) (ref 1.0–2.5)
ALT: 20 U/L (ref 9–46)
AST: 24 U/L (ref 10–35)
Albumin: 4.1 g/dL (ref 3.6–5.1)
Alkaline phosphatase (APISO): 61 U/L (ref 35–144)
BUN: 13 mg/dL (ref 7–25)
CO2: 27 mmol/L (ref 20–32)
Calcium: 9.8 mg/dL (ref 8.6–10.3)
Chloride: 103 mmol/L (ref 98–110)
Creat: 1.24 mg/dL (ref 0.70–1.35)
Globulin: 3.3 g/dL (calc) (ref 1.9–3.7)
Glucose, Bld: 77 mg/dL (ref 65–99)
Potassium: 5.4 mmol/L — ABNORMAL HIGH (ref 3.5–5.3)
Sodium: 140 mmol/L (ref 135–146)
Total Bilirubin: 0.4 mg/dL (ref 0.2–1.2)
Total Protein: 7.4 g/dL (ref 6.1–8.1)
eGFR: 65 mL/min/{1.73_m2} (ref >=60)

## 2022-02-01 LAB — LIPID PANEL
Cholesterol: 146 mg/dL (ref <200)
HDL: 67 mg/dL (ref >=40)
LDL Cholesterol (Calc): 56 mg/dL (calc)
Non-HDL Cholesterol (Calc): 79 mg/dL (calc) (ref <130)
Total CHOL/HDL Ratio: 2.2 (calc) (ref <5.0)
Triglycerides: 145 mg/dL (ref <150)

## 2022-02-01 LAB — CBC WITH DIFFERENTIAL/PLATELET
Absolute Monocytes: 413 cells/uL (ref 200–950)
Basophils Absolute: 99 cells/uL (ref 0–200)
Basophils Relative: 1.8 %
Eosinophils Absolute: 341 cells/uL (ref 15–500)
Eosinophils Relative: 6.2 %
HCT: 38 % — ABNORMAL LOW (ref 38.5–50.0)
Hemoglobin: 12.7 g/dL — ABNORMAL LOW (ref 13.2–17.1)
Lymphs Abs: 2459 cells/uL (ref 850–3900)
MCH: 31.5 pg (ref 27.0–33.0)
MCHC: 33.4 g/dL (ref 32.0–36.0)
MCV: 94.3 fL (ref 80.0–100.0)
MPV: 9.7 fL (ref 7.5–12.5)
Monocytes Relative: 7.5 %
Neutro Abs: 2189 cells/uL (ref 1500–7800)
Neutrophils Relative %: 39.8 %
Platelets: 305 10*3/uL (ref 140–400)
RBC: 4.03 10*6/uL — ABNORMAL LOW (ref 4.20–5.80)
RDW: 13.1 % (ref 11.0–15.0)
Total Lymphocyte: 44.7 %
WBC: 5.5 10*3/uL (ref 3.8–10.8)

## 2022-02-01 LAB — IRON, TOTAL/TOTAL IRON BINDING CAP
%SAT: 20 % (calc) (ref 20–48)
Iron: 70 ug/dL (ref 50–180)
TIBC: 347 mcg/dL (calc) (ref 250–425)

## 2022-02-01 LAB — VITAMIN D 25 HYDROXY (VIT D DEFICIENCY, FRACTURES): Vit D, 25-Hydroxy: 53 ng/mL (ref 30–100)

## 2022-02-01 LAB — TSH: TSH: 1.53 mIU/L (ref 0.40–4.50)

## 2022-02-01 LAB — MAGNESIUM: Magnesium: 2.5 mg/dL (ref 1.5–2.5)

## 2022-02-01 LAB — HEMOGLOBIN A1C
Hgb A1c MFr Bld: 6 % of total Hgb — ABNORMAL HIGH (ref <5.7)
Mean Plasma Glucose: 126 mg/dL
eAG (mmol/L): 7 mmol/L

## 2022-02-01 LAB — FERRITIN: Ferritin: 36 ng/mL (ref 24–380)

## 2022-02-01 LAB — MICROALBUMIN / CREATININE URINE RATIO
Creatinine, Urine: 68 mg/dL (ref 20–320)
Microalb Creat Ratio: 4 mcg/mg creat (ref <30)
Microalb, Ur: 0.3 mg/dL

## 2022-02-01 LAB — PSA: PSA: 0.41 ng/mL (ref <=4.00)

## 2022-02-06 NOTE — Progress Notes (Signed)
Patient is aware of lab results and instructions. -e welch

## 2022-02-19 ENCOUNTER — Other Ambulatory Visit: Payer: Self-pay | Admitting: Internal Medicine

## 2022-02-19 MED ORDER — SILDENAFIL CITRATE 20 MG PO TABS: ORAL_TABLET | ORAL | 1 refills | Status: DC

## 2022-08-02 ENCOUNTER — Other Ambulatory Visit: Payer: Self-pay | Admitting: Internal Medicine

## 2022-08-02 MED ORDER — SILDENAFIL CITRATE 20 MG PO TABS: ORAL_TABLET | ORAL | 0 refills | Status: DC

## 2022-08-05 NOTE — Progress Notes (Unsigned)
6 MONTH FOLLOW UP  Assessment and Plan:   Tony Ware was seen today for follow-up.  Diagnoses and all orders for this visit:  Labile hypertension - currently controlled without medications.  Continue DASH diet, exercise and monitor at home. Call if greater than 130/80.  -     COMPLETE METABOLIC PANEL WITH GFR -     CBC with Differential/Platelet  Hyperlipidemia, mixed Continue Atorvastatin, diet and exercise -     COMPLETE METABOLIC PANEL WITH GFR -     Lipid panel  Abnormal glucose Continue diet and exercise -     Hemoglobin A1c  Vitamin D deficiency Continue Vit D supplementation to maintain value in therapeutic level of 60-100   Medication management -     COMPLETE METABOLIC PANEL WITH GFR -     Lipid panel -     Hemoglobin A1c -     CBC with Differential/Platelet  Sickle-cell trait (HCC) - CBC Continue to monitor  Smoker Strongly encouraged to stop smoking, is willing, Chantix prescribed -     varenicline (CHANTIX CONTINUING MONTH PAK) 1 MG tablet; Take 1 tablet (1 mg total) by mouth 2 (two) times daily. -     DG Chest 2 View; Future  Costochondritis Apply heat/ice alternating and rest, Begin Meloxicam for 7-10 days If symptoms do not improve notify the office -     Meloxicam 15 MG TBDP; Take 15 mg by mouth daily.  Rib pain on right side If pain does not resolve with use of Meloxicam notify the office -     DG Chest 2 View; Future  Weight loss  Encouraged high protein, calorie dense food choices Try for at least 1 boost or protein shake [name]    Continue diet and meds as discussed. Further disposition pending results of labs. Over 30 minutes of exam, counseling, chart review, and critical decision making was performed Future Appointments  Date Time Provider [place]  02/04/2023  3:00 PM Alycia Rossetti, NP [facility] None    HPI [name]  presents for 6 month follow up on hypertension, cholesterol, prediabetes, and vitamin D deficiency.    He has been experiencing right rib pain for approximately 10 days. It is 1 particular spot that is tender to the touch.  Denies any injuries.    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  He denies chest pain, shortness of breath, dizziness.   BMI is Body mass index is 22.01 kg/m., he is working on diet and exercise, he has been trying to keep his weight down to help decrease pain in his right hip. Has not been like he should.   Wt Readings from Last 3 Encounters:  08/06/22 153 lb 6.4 oz (69.6 kg)  01/31/22 160 lb (72.6 kg)  08/27/21 158 lb (71.7 kg)   Has started back to smoking, 7-8 cigarettes a [name]. Last chest xray 2019. 15 year smoking history less than a pack a [name]. Started back to smoking after he lost his daughter in 2013.     He is on cholesterol medication, Atorvastatin 80 mg QD,  and denies myalgias. His cholesterol is at goal. The cholesterol last visit was:   Lab Results  Component Value Date   CHOL 146 01/31/2022  HDL 67 01/31/2022   LDLCALC 56 01/31/2022   TRIG 145 01/31/2022   CHOLHDL 2.2 01/31/2022    He has been working on diet and exercise for prediabetes, and denies paresthesia of the feet, polydipsia, polyuria and visual disturbances. Last A1C in the office was:  Lab Results  Component Value Date   HGBA1C 6.0 (H) 01/31/2022   Patient is on Vitamin D supplement.   Lab Results  Component Value Date   VD25OH 53 01/31/2022      Current Medications:    Current Outpatient Medications (Cardiovascular):    atorvastatin (LIPITOR) 80 MG tablet, Take  1 tablet  Daily  for Cholesterol to Prevent Heart Attack, Strokes, Impotence  & Dementia   sildenafil (REVATIO) 20 MG tablet, Take      1 to 5 tablets   /[place] as needed for XXXX                                                    /                             TAKE                    BY                      MOUTH   Current Outpatient Medications (Analgesics):    acetaminophen (TYLENOL) 325 MG tablet, Take 650 mg by mouth every 6 (six) hours as needed.   aspirin EC 81 MG tablet, Take 81 mg by mouth daily. Swallow whole.   Current Outpatient Medications (Other):    acyclovir (ZOVIRAX) 400 MG tablet, Take 1 tablet 2 x /[place] to prevent fever blisters   Ascorbic Acid (VITAMIN C PO), Take 1 tablet by mouth daily.   chlorhexidine (PERIDEX) 0.12 % solution, 15 mLs 2 (two) times daily.   Cholecalciferol (VITAMIN D PO), Take 2,000 Units by mouth daily.    OVER THE COUNTER MEDICATION, Takes an OTC iron tablet daily.  Medical History:  Past Medical History:  Diagnosis Date   Labile hypertension    09/25/20- pt states no HTN   Osteoarthritis    Other testicular hypofunction    Prediabetes    Allergies: No Known Allergies   Review of Systems:  Review of Systems  Constitutional:  Positive for weight loss. Negative for chills, fever and malaise/fatigue.  HENT:  Negative for congestion, ear discharge, ear pain, hearing loss, nosebleeds, sore throat and tinnitus.   Eyes: Negative.  Negative for blurred vision and double vision.  Respiratory: Negative.  Negative for cough, shortness of breath and stridor.   Cardiovascular: Negative.  Negative for chest pain, palpitations, orthopnea and leg swelling.  Gastrointestinal:  Negative for abdominal pain, blood in stool, constipation, diarrhea, heartburn, melena, nausea and vomiting.  Genitourinary: Negative.   Musculoskeletal:  Negative for falls, joint pain and myalgias.       Right rib pain  Skin: Negative.  Negative for rash.  Neurological: Negative.  Negative for dizziness, tingling, tremors, loss of consciousness, weakness and headaches.  Endo/Heme/Allergies: Negative.   Psychiatric/Behavioral: Negative.  Negative for depression, memory loss and suicidal  ideas.     Family history- Review and unchanged Social history- Review and unchanged Physical Exam: BP 108/62  Pulse 71   Temp 97.8 F (36.6 C)   Ht '5\' 10"'$  (1.778 m)   Wt 153 lb 6.4 oz (69.6 kg)   SpO2 98%   BMI 22.01 kg/m  Wt Readings from Last 3 Encounters:  08/06/22 153 lb 6.4 oz (69.6 kg)  01/31/22 160 lb (72.6 kg)  08/27/21 158 lb (71.7 kg)   General Appearance: Pleasant thin male, in no apparent distress. Eyes: PERRLA, EOMs, conjunctiva no swelling or erythema Sinuses: No Frontal/maxillary tenderness ENT/Mouth: Ext aud canals clear, TMs without erythema, bulging. No erythema, swelling, or exudate on post pharynx.  Tonsils not swollen or erythematous. Hearing normal.  Neck: Supple, thyroid normal.  Respiratory: Respiratory effort normal, BS equal bilaterally without rales, rhonchi, wheezing or stridor.  Cardio: RRR with no MRGs. Brisk peripheral pulses without edema.  Abdomen: Soft, + BS,  Non tender, no guarding, rebound, hernias, masses. Lymphatics: Non tender without lymphadenopathy.  Musculoskeletal: Full ROM, 5/5 strength, Tenderness over 5th rib insertion to sternum with pressure Strength is normal and symmetric in arms. Skin:  Warm, dry without rashes, lesions, ecchymosis.  Neuro: Cranial nerves intact. Normal muscle tone, no cerebellar symptoms. Psych: Awake and oriented X 3, normal affect, Insight and Judgment appropriate.       Alycia Rossetti, NP 3:30 PM [facility]

## 2022-08-06 ENCOUNTER — Ambulatory Visit: Payer: Managed Care, Other (non HMO) | Admitting: Nurse Practitioner

## 2022-08-06 ENCOUNTER — Encounter: Payer: Self-pay | Admitting: Nurse Practitioner

## 2022-08-06 VITALS — BP 108/62 | HR 71 | Temp 97.8°F | Ht 70.0 in | Wt 153.4 lb

## 2022-08-06 DIAGNOSIS — M940 Chondrocostal junction syndrome [Tietze]: Secondary | ICD-10-CM

## 2022-08-06 DIAGNOSIS — E782 Mixed hyperlipidemia: Secondary | ICD-10-CM

## 2022-08-06 DIAGNOSIS — Z79899 Other long term (current) drug therapy: Secondary | ICD-10-CM

## 2022-08-06 DIAGNOSIS — R7309 Other abnormal glucose: Secondary | ICD-10-CM | POA: Diagnosis not present

## 2022-08-06 DIAGNOSIS — E559 Vitamin D deficiency, unspecified: Secondary | ICD-10-CM

## 2022-08-06 DIAGNOSIS — R0989 Other specified symptoms and signs involving the circulatory and respiratory systems: Secondary | ICD-10-CM

## 2022-08-06 DIAGNOSIS — R0781 Pleurodynia: Secondary | ICD-10-CM

## 2022-08-06 DIAGNOSIS — F17200 Nicotine dependence, unspecified, uncomplicated: Secondary | ICD-10-CM

## 2022-08-06 DIAGNOSIS — D573 Sickle-cell trait: Secondary | ICD-10-CM

## 2022-08-06 MED ORDER — VARENICLINE TARTRATE 1 MG PO TABS: 1.0000 mg | ORAL_TABLET | Freq: Two times a day (BID) | ORAL | 0 refills | Status: DC

## 2022-08-06 MED ORDER — MELOXICAM 15 MG PO TBDP: 15.0000 mg | ORAL_TABLET | Freq: Every day | ORAL | 0 refills | Status: DC

## 2022-08-06 NOTE — Patient Instructions (Addendum)
Chest xray ordered at [facility] in M-F 8:30-3:45  Meloxicam daily x 7-10 days If symptoms do not improve notify the office and will get imaging of the area  Increase protein and calories, try to have 1 boost or protein shake daily  Chantix twice a Derrick to stop smoking  Costochondritis  Costochondritis is inflammation of the tissue (cartilage) that connects the ribs to the breastbone (sternum). This causes pain in the front of the chest. The pain usually starts slowly and involves more than one rib. What are the causes? The exact cause of this condition is not always known. It results from stress on the cartilage where your ribs attach to your sternum. The cause of this stress could be: Chest injury. Exercise or activity, such as lifting. Severe coughing. What increases the risk? You are more likely to develop this condition if you: Are male. Are 55-30 years old. Recently started a new exercise or work activity. Have low levels of vitamin D. Have a condition that makes you cough frequently. What are the signs or symptoms? The main symptom of this condition is chest pain. The pain: Usually starts gradually and can be sharp or dull. Gets worse with deep breathing, coughing, or exercise. Gets better with rest. May be worse when you press on the affected area of your ribs and sternum. How is this diagnosed? This condition is diagnosed based on your symptoms, your medical history, and a physical exam. Your health care provider will check for pain when pressing on your sternum. You may also have tests to rule out other causes of chest pain. These may include: A chest X-ray to check for lung problems. An ECG (electrocardiogram) to see if you have a heart problem that could be causing the pain. An imaging scan to rule out a chest or rib fracture. How is this treated? This condition usually goes away on its own over time. Your health care  provider may prescribe an NSAID, such as ibuprofen, to reduce pain and inflammation. Treatment may also include: Resting and avoiding activities that make pain worse. Applying heat or ice to the area to reduce pain and inflammation. Doing exercises to stretch your chest muscles. If these treatments do not help, your health care provider may inject a numbing medicine at the sternum-rib connection to help relieve the pain. Follow these instructions at home: Managing pain, stiffness, and swelling     If directed, put ice on the painful area. To do this: Put ice in a plastic bag. Place a towel between your skin and the bag. Leave the ice on for 20 minutes, 2-3 times a Hab[place]. If directed, apply heat to the affected area as often as told by your health care provider. Use the heat source that your health care provider recommends, such as a moist heat pack or a heating pad. Place a towel between your skin and the heat source. Leave the heat on for 20-30 minutes. Remove the heat if your skin turns bright red. This is especially important if you are unable to feel pain, heat, or cold. You may have a greater risk of getting burned. Activity Rest as told by your health care provider. Avoid activities that make pain worse. This includes any activities that use chest, abdominal, and side muscles. Do not lift anything that is heavier than 10 lb (4.5 kg), or the limit that you are told, until your health care provider says that it is safe. Return  to your normal activities as told by your health care provider. Ask your health care provider what activities are safe for you. General instructions Take over-the-counter and prescription medicines only as told by your health care provider. Keep all follow-up visits as told by your health care provider. This is important. Contact a health care provider if: You have chills or a fever. Your pain does not go away or it gets worse. You have a cough that does not go  away. Get help right away if: You have shortness of breath. You have severe chest pain that is not relieved by medicines, heat, or ice. These symptoms may represent a serious problem that is an emergency. Do not wait to see if the symptoms will go away. Get medical help right away. Call your local emergency services (911 in the [place] Do not drive yourself to the hospital.  Summary Costochondritis is inflammation of the tissue (cartilage) that connects the ribs to the breastbone (sternum). This condition causes pain in the front of the chest. Costochondritis results from stress on the cartilage where your ribs attach to your sternum. Treatment may include medicines, rest, heat or ice, and exercises. This information is not intended to replace advice given to you by your health care provider. Make sure you discuss any questions you have with your health care provider. Document Revised: 01/22/2022 Document Reviewed: 09/17/2019 Elsevier Patient Education  [place].

## 2022-08-07 LAB — CBC WITH DIFFERENTIAL/PLATELET
Absolute Monocytes: 350 cells/uL (ref 200–950)
Basophils Absolute: 69 cells/uL (ref 0–200)
Basophils Relative: 1.5 %
Eosinophils Absolute: 189 cells/uL (ref 15–500)
Eosinophils Relative: 4.1 %
HCT: 36.6 % — ABNORMAL LOW (ref 38.5–50.0)
Hemoglobin: 12.3 g/dL — ABNORMAL LOW (ref 13.2–17.1)
Lymphs Abs: 2272 cells/uL (ref 850–3900)
MCH: 31.9 pg (ref 27.0–33.0)
MCHC: 33.6 g/dL (ref 32.0–36.0)
MCV: 94.8 fL (ref 80.0–100.0)
MPV: 9.3 fL (ref 7.5–12.5)
Monocytes Relative: 7.6 %
Neutro Abs: 1720 cells/uL (ref 1500–7800)
Neutrophils Relative %: 37.4 %
Platelets: 288 10*3/uL (ref 140–400)
RBC: 3.86 10*6/uL — ABNORMAL LOW (ref 4.20–5.80)
RDW: 13.1 % (ref 11.0–15.0)
Total Lymphocyte: 49.4 %
WBC: 4.6 10*3/uL (ref 3.8–10.8)

## 2022-08-07 LAB — COMPLETE METABOLIC PANEL WITH GFR
AG Ratio: 1.6 (calc) (ref 1.0–2.5)
ALT: 19 U/L (ref 9–46)
AST: 21 U/L (ref 10–35)
Albumin: 4.2 g/dL (ref 3.6–5.1)
Alkaline phosphatase (APISO): 54 U/L (ref 35–144)
BUN: 11 mg/dL (ref 7–25)
CO2: 28 mmol/L (ref 20–32)
Calcium: 9.9 mg/dL (ref 8.6–10.3)
Chloride: 104 mmol/L (ref 98–110)
Creat: 1.16 mg/dL (ref 0.70–1.35)
Globulin: 2.7 g/dL (calc) (ref 1.9–3.7)
Glucose, Bld: 83 mg/dL (ref 65–99)
Potassium: 5.1 mmol/L (ref 3.5–5.3)
Sodium: 139 mmol/L (ref 135–146)
Total Bilirubin: 0.3 mg/dL (ref 0.2–1.2)
Total Protein: 6.9 g/dL (ref 6.1–8.1)
eGFR: 69 mL/min/{1.73_m2} (ref >=60)

## 2022-08-07 LAB — LIPID PANEL
Cholesterol: 149 mg/dL (ref <200)
HDL: 69 mg/dL (ref >=40)
LDL Cholesterol (Calc): 59 mg/dL (calc)
Non-HDL Cholesterol (Calc): 80 mg/dL (calc) (ref <130)
Total CHOL/HDL Ratio: 2.2 (calc) (ref <5.0)
Triglycerides: 131 mg/dL (ref <150)

## 2022-08-07 LAB — HEMOGLOBIN A1C
Hgb A1c MFr Bld: 5.8 % of total Hgb — ABNORMAL HIGH (ref <5.7)
Mean Plasma Glucose: 120 mg/dL
eAG (mmol/L): 6.6 mmol/L

## 2022-08-12 ENCOUNTER — Ambulatory Visit
Admission: RE | Admit: 2022-08-12 | Discharge: 2022-08-12 | Disposition: A | Discharge status: Home / Self Care | Payer: Managed Care, Other (non HMO) | Source: Ambulatory Visit | Attending: Nurse Practitioner

## 2022-08-12 DIAGNOSIS — R0781 Pleurodynia: Secondary | ICD-10-CM

## 2022-08-12 DIAGNOSIS — F17200 Nicotine dependence, unspecified, uncomplicated: Secondary | ICD-10-CM

## 2022-11-25 ENCOUNTER — Other Ambulatory Visit: Payer: Self-pay | Admitting: Internal Medicine

## 2022-11-25 MED ORDER — SILDENAFIL CITRATE 20 MG PO TABS: ORAL_TABLET | ORAL | 0 refills | Status: DC

## 2022-11-27 ENCOUNTER — Other Ambulatory Visit: Payer: Self-pay | Admitting: Internal Medicine

## 2022-11-28 ENCOUNTER — Other Ambulatory Visit: Payer: Self-pay | Admitting: Internal Medicine

## 2022-12-04 ENCOUNTER — Telehealth: Payer: Self-pay

## 2022-12-04 NOTE — Telephone Encounter (Signed)
Prior auth denied.   Prior auth completed and submitted.

## 2023-02-03 NOTE — Progress Notes (Unsigned)
Complete Physical  Assessment and Plan:   Tony Ware was seen today for annual exam.  Diagnoses and all orders for this visit:  Encounter for general adult medical examination with abnormal findings Due yearly  Labile hypertension Currently well controlled without medication Continue DASH diet, monitor BP and notify office if BP consistently running greater than 130/80 Go to the ER if any chest pain, shortness of breath, nausea, dizziness, severe HA, changes vision/speech   Hyperlipidemia, mixed Continue atorvastatin, low saturated fat diet and exercise -     COMPLETE METABOLIC PANEL WITH GFR -     Lipid panel -     TSH  Abnormal glucose Continue diet and exercise -     Hemoglobin A1c  Vitamin D deficiency -     VITAMIN D 25 Hydroxy (Vit-D Deficiency, Fractures)  Medication management -     Magnesium  BPH with obstruction/lower urinary tract symptoms Getting up once a night  Continue to monitor -     PSA  Sickle-cell trait (HCC) Monitor -     CBC with Differential/Platelet -     Iron, Total/Total Iron Binding Cap -     Ferritin  Smoker Has been cutting down with Chantix Continuing pack sent to pharmacy  Screening for ischemic heart disease -     EKG 12-Lead  Screening for hematuria or proteinuria -     Urinalysis, Routine w reflex microscopic -     Microalbumin / creatinine urine ratio  Screening for thyroid disorder -     TSH  BMI 24.0-24.9, adult Continue caloric dense foods and increase protein     Discussed med's effects and SE's. Screening labs and tests as requested with regular follow-up as recommended. Over 40 minutes of exam, counseling, chart review and critical decision making was performed  HPI Patient presents for a complete physical. has SICKLE CELL TRAIT; Anemia of chronic disease; SMOKER; Labile hypertension; Osteoarthritis; Other abnormal glucose (prediabetes); Testosterone deficiency; Hyperlipidemia; Vitamin D deficiency; and Medication  management on their problem list.    He is continuing to smoke 5-6 cigarettes a Howry. He has been prescribed Chantix and used sporadically, does want a refill today.  His left shoulder has been hurting x 2 weeks, has been using heating pad. He does use Meloxicam as needed which provides relief.   His blood pressure has been controlled at home, today their BP is BP: 124/70  BP Readings from Last 3 Encounters:  02/04/23 124/70  08/06/22 108/62  01/31/22 100/60   BMI is Body mass index is 24.62 kg/m., he has been working on diet and exercise. Wt Readings from Last 3 Encounters:  02/04/23 171 lb 9.6 oz (77.8 kg)  08/06/22 153 lb 6.4 oz (69.6 kg)  01/31/22 160 lb (72.6 kg)  He does workout. He denies chest pain, shortness of breath, dizziness.    He is on cholesterol medication , Atorvastatin 80 mg QD and denies myalgias. His cholesterol is at goal. The cholesterol last visit was:   Lab Results  Component Value Date   CHOL 149 08/06/2022   HDL 69 08/06/2022   LDLCALC 59 08/06/2022   TRIG 131 08/06/2022   CHOLHDL 2.2 08/06/2022   He has been working on diet and exercise for abnormal glucose, he is not on bASA, he is not on ACE/ARB a. Last A1C in the office was:  Lab Results  Component Value Date   HGBA1C 5.8 (H) 08/06/2022    Lab Results  Component Value Date   EGFR 69  08/06/2022     Patient is on Vitamin D supplement.   Lab Results  Component Value Date   VD25OH 60 01/31/2022     Last PSA was: Lab Results  Component Value Date   PSA 0.41 01/31/2022    Current Medications:  Current Outpatient Medications on File Prior to Visit  Medication Sig Dispense Refill   acetaminophen (TYLENOL) 325 MG tablet Take 650 mg by mouth every 6 (six) hours as needed.     acyclovir (ZOVIRAX) 400 MG tablet Take 1 tablet 2 x /Gupta to prevent fever blisters 180 tablet 3   Ascorbic Acid (VITAMIN C PO) Take 1 tablet by mouth daily.     aspirin EC 81 MG tablet Take 81 mg by mouth daily.  Swallow whole.     atorvastatin (LIPITOR) 80 MG tablet Take  1 tablet  Daily  for Cholesterol to Prevent Heart Attack, Strokes, Impotence  & Dementia 90 tablet 3   Cholecalciferol (VITAMIN D PO) Take 2,000 Units by mouth daily.      Meloxicam 15 MG TBDP Take 15 mg by mouth daily. 30 tablet 0   OVER THE COUNTER MEDICATION Takes an OTC iron tablet daily.     sildenafil (REVATIO) 20 MG tablet TAKE ONE TO FIVE TABLETS BY MOUTH ONCE DAILY AS NEEDED 30 tablet 0   varenicline (CHANTIX CONTINUING MONTH PAK) 1 MG tablet Take 1 tablet (1 mg total) by mouth 2 (two) times daily. 60 tablet 0   chlorhexidine (PERIDEX) 0.12 % solution 15 mLs 2 (two) times daily. (Patient not taking: Reported on 02/04/2023)     No current facility-administered medications on file prior to visit.   Allergies:  No Known Allergies Health Maintenance:  Immunization History  Administered Date(s) Administered   Influenza Inj Mdck Quad With Preservative 09/29/2019   PPD Test 06/12/2017, 08/24/2018, 09/29/2019, 01/23/2021   Td 11/18/2004   Tdap 12/25/2015   Health Maintenance  Topic Date Due   Medicare Annual Wellness (AWV)  Never done   COVID-19 Vaccine (1) Never done   Pneumonia Vaccine 28+ Years old (1 of 2 - PCV) Never done   Hepatitis C Screening  Never done   Zoster Vaccines- Shingrix (1 of 2) Never done   INFLUENZA VACCINE  06/18/2022   DTaP/Tdap/Td (3 - Td or Tdap) 12/24/2025   COLONOSCOPY (Pts 45-13yrs Insurance coverage will need to be confirmed)  10/10/2027   HPV VACCINES  Aged Out     Patient Care Team: Unk Pinto, MD as PCP - General (Internal Medicine)  Medical History:  has SICKLE CELL TRAIT; Anemia of chronic disease; SMOKER; Labile hypertension; Osteoarthritis; Other abnormal glucose (prediabetes); Testosterone deficiency; Hyperlipidemia; Vitamin D deficiency; and Medication management on their problem list. Surgical History:  He  has a past surgical history that includes Hernia repair; Thulium  laser TURP (transurethral resection of prostate) (N/A, 01/05/2019); and Colonoscopy (06/2010). Family History:  His family history includes Cancer in his father; Colon cancer in his father; Colon polyps in his father; Diabetes in his mother; Hypertension in his mother. Social History:   reports that he has been smoking cigarettes. He has a 10.25 pack-year smoking history. He has never used smokeless tobacco. He reports current alcohol use of about 2.0 standard drinks of alcohol per week. He reports that he does not use drugs. Review of Systems:  Review of Systems  Constitutional:  Negative for chills and fever.  HENT:  Positive for congestion. Negative for hearing loss, sinus pain, sore throat and tinnitus.  Eyes:  Negative for blurred vision and double vision.  Respiratory:  Negative for cough, hemoptysis, sputum production, shortness of breath and wheezing.   Cardiovascular:  Negative for chest pain, palpitations and leg swelling.  Gastrointestinal:  Negative for abdominal pain, constipation, diarrhea, heartburn, nausea and vomiting.  Genitourinary:  Negative for dysuria and urgency.  Musculoskeletal:  Positive for back pain and joint pain (left shoulder). Negative for falls, myalgias and neck pain.  Skin:  Negative for rash.  Neurological:  Negative for dizziness, tingling, tremors, weakness and headaches.  Endo/Heme/Allergies:  Does not bruise/bleed easily.  Psychiatric/Behavioral:  Negative for depression and suicidal ideas. The patient is not nervous/anxious and does not have insomnia.     Physical Exam: Estimated body mass index is 24.62 kg/m as calculated from the following:   Height as of this encounter: 5\' 10"  (1.778 m).   Weight as of this encounter: 171 lb 9.6 oz (77.8 kg). BP 124/70   Pulse 97   Temp (!) 97.5 F (36.4 C)   Ht 5\' 10"  (1.778 m)   Wt 171 lb 9.6 oz (77.8 kg)   SpO2 97%   BMI 24.62 kg/m  General Appearance: Very pleasant thin male, in no apparent distress.   Eyes: PERRLA, EOMs, conjunctiva no swelling or erythema, normal fundi and vessels.  Sinuses: No Frontal/maxillary tenderness  ENT/Mouth: Ext aud canals clear, normal light reflex with TMs without erythema, bulging. Good dentition. No erythema, swelling, or exudate on post pharynx. Tonsils not swollen or erythematous. Hearing normal.  Neck: Supple, thyroid normal. No bruits  Respiratory: Respiratory effort normal, BS equal bilaterally without rales, rhonchi, wheezing or stridor.  Cardio: RRR without murmurs, rubs or gallops. Brisk peripheral pulses without edema.  Chest: symmetric, with normal excursions and percussion.  Abdomen: Soft, nontender, no guarding, rebound, hernias, masses, or organomegaly.  Lymphatics: Non tender without lymphadenopathy.  Genitourinary:  Musculoskeletal: Full ROM all peripheral extremities,5/5 strength, and normal gait. Mild tenderness midline lower back Skin: Warm, dry without rashes, lesions, ecchymosis. Neuro: Cranial nerves intact, reflexes equal bilaterally. Normal muscle tone, no cerebellar symptoms. Sensation intact.  Psych: Awake and oriented X 3, normal affect, Insight and Judgment appropriate.   EKG: NSR, no ST changes. AAA: < 3 cm  Vauda Salvucci E  3:14 PM [facility]

## 2023-02-04 ENCOUNTER — Encounter: Payer: Self-pay | Admitting: Nurse Practitioner

## 2023-02-04 ENCOUNTER — Ambulatory Visit (INDEPENDENT_AMBULATORY_CARE_PROVIDER_SITE_OTHER): Payer: Medicare Other | Admitting: Nurse Practitioner

## 2023-02-04 ENCOUNTER — Other Ambulatory Visit: Payer: Self-pay | Admitting: Nurse Practitioner

## 2023-02-04 VITALS — BP 124/70 | HR 97 | Temp 97.5°F | Ht 70.0 in | Wt 171.6 lb

## 2023-02-04 DIAGNOSIS — E559 Vitamin D deficiency, unspecified: Secondary | ICD-10-CM

## 2023-02-04 DIAGNOSIS — Z0000 Encounter for general adult medical examination without abnormal findings: Secondary | ICD-10-CM

## 2023-02-04 DIAGNOSIS — Z1329 Encounter for screening for other suspected endocrine disorder: Secondary | ICD-10-CM

## 2023-02-04 DIAGNOSIS — R7309 Other abnormal glucose: Secondary | ICD-10-CM

## 2023-02-04 DIAGNOSIS — R0989 Other specified symptoms and signs involving the circulatory and respiratory systems: Secondary | ICD-10-CM

## 2023-02-04 DIAGNOSIS — I700 Atherosclerosis of aorta: Secondary | ICD-10-CM

## 2023-02-04 DIAGNOSIS — Z1389 Encounter for screening for other disorder: Secondary | ICD-10-CM

## 2023-02-04 DIAGNOSIS — D573 Sickle-cell trait: Secondary | ICD-10-CM

## 2023-02-04 DIAGNOSIS — E782 Mixed hyperlipidemia: Secondary | ICD-10-CM

## 2023-02-04 DIAGNOSIS — Z136 Encounter for screening for cardiovascular disorders: Secondary | ICD-10-CM | POA: Diagnosis not present

## 2023-02-04 DIAGNOSIS — F17200 Nicotine dependence, unspecified, uncomplicated: Secondary | ICD-10-CM

## 2023-02-04 DIAGNOSIS — Z6822 Body mass index (BMI) 22.0-22.9, adult: Secondary | ICD-10-CM

## 2023-02-04 DIAGNOSIS — Z79899 Other long term (current) drug therapy: Secondary | ICD-10-CM | POA: Diagnosis not present

## 2023-02-04 DIAGNOSIS — N138 Other obstructive and reflux uropathy: Secondary | ICD-10-CM

## 2023-02-04 DIAGNOSIS — M940 Chondrocostal junction syndrome [Tietze]: Secondary | ICD-10-CM

## 2023-02-04 DIAGNOSIS — Z0001 Encounter for general adult medical examination with abnormal findings: Secondary | ICD-10-CM

## 2023-02-04 MED ORDER — VARENICLINE TARTRATE 1 MG PO TABS: 1.0000 mg | ORAL_TABLET | Freq: Two times a day (BID) | ORAL | 0 refills | Status: DC

## 2023-02-04 MED ORDER — ATORVASTATIN CALCIUM 80 MG PO TABS: ORAL_TABLET | ORAL | 3 refills | Status: DC

## 2023-02-04 MED ORDER — ACYCLOVIR 400 MG PO TABS: ORAL_TABLET | ORAL | 3 refills | Status: DC

## 2023-02-04 NOTE — Patient Instructions (Signed)
GENERAL HEALTH GOALS   Know what a healthy weight is for you (roughly BMI <25) and aim to maintain this   Aim for 7+ servings of fruits and vegetables daily   70-80+ fluid ounces of water or unsweet tea for healthy kidneys   Limit to max 1 drink of alcohol per Maye; avoid smoking/tobacco   Limit animal fats in diet for cholesterol and heart health - choose grass fed whenever available   Avoid highly processed foods, and foods high in saturated/trans fats   Aim for low stress - take time to unwind and care for your mental health   Aim for 150 min of moderate intensity exercise weekly for heart health, and weights twice weekly for bone health   Aim for 7-9 hours of sleep daily  

## 2023-02-04 NOTE — Progress Notes (Signed)
Complete Physical  Assessment and Plan:   Tony Ware was seen today for annual exam.  Diagnoses and all orders for this visit:  Encounter for general adult medical examination with abnormal findings Due yearly  Labile hypertension Currently well controlled without medication Continue DASH diet, monitor BP and notify office if BP consistently running greater than 130/80 Go to the ER if any chest pain, shortness of breath, nausea, dizziness, severe HA, changes vision/speech   Hyperlipidemia, mixed Continue atorvastatin, low saturated fat diet and exercise -     COMPLETE METABOLIC PANEL WITH GFR -     Lipid panel -     TSH  Abnormal glucose Continue diet and exercise -     Hemoglobin A1c  Vitamin D deficiency Continue Vit D supplementation to maintain value in therapeutic level of 60-100  -     VITAMIN D 25 Hydroxy (Vit-D Deficiency, Fractures)  Medication management -     Magnesium  BPH with obstruction/lower urinary tract symptoms Getting up once a night  Continue to monitor -     PSA  Sickle-cell trait (HCC) Monitor -     CBC with Differential/Platelet -     Iron, Total/Total Iron Binding Cap -     Ferritin  Smoker Has been cutting down with Chantix Continuing pack sent to pharmacy  Screening for ischemic heart disease -     EKG 12-Lead  Screening for hematuria or proteinuria -     Urinalysis, Routine w reflex microscopic -     Microalbumin / creatinine urine ratio  Screening for thyroid disorder -     TSH  Screening for AAA - U/S ABD Retroperitoneal LTD  BMI 24.0-24.9, adult Continue caloric dense foods and increase protein     Discussed med's effects and SE's. Screening labs and tests as requested with regular follow-up as recommended. Over 40 minutes of exam, counseling, chart review and critical decision making was performed  HPI Patient presents for a complete physical. has SICKLE CELL TRAIT; Anemia of chronic disease; SMOKER; Labile hypertension;  Osteoarthritis; Other abnormal glucose (prediabetes); Testosterone deficiency; Hyperlipidemia; Vitamin D deficiency; and Medication management on their problem list.    He is continuing to smoke 5-6 cigarettes a Bray. He has been prescribed Chantix and used sporadically, does want a refill today.  His left shoulder has been hurting x 2 weeks, has been using heating pad which helps.   His blood pressure has been controlled at home, today their BP is BP: 124/70  BP Readings from Last 3 Encounters:  02/04/23 124/70  08/06/22 108/62  01/31/22 100/60   BMI is Body mass index is 24.62 kg/m., he has been working on diet and exercise. Wt Readings from Last 3 Encounters:  02/04/23 171 lb 9.6 oz (77.8 kg)  08/06/22 153 lb 6.4 oz (69.6 kg)  01/31/22 160 lb (72.6 kg)  He does workout. He denies chest pain, shortness of breath, dizziness.    He is on cholesterol medication , Atorvastatin 80 mg QD and denies myalgias. His cholesterol is at goal. The cholesterol last visit was:   Lab Results  Component Value Date   CHOL 149 08/06/2022   HDL 69 08/06/2022   LDLCALC 59 08/06/2022   TRIG 131 08/06/2022   CHOLHDL 2.2 08/06/2022   He has been working on diet and exercise for abnormal glucose, he is not on bASA, he is not on ACE/ARB a. Last A1C in the office was:  Lab Results  Component Value Date   HGBA1C 5.8 (  H) 08/06/2022    Lab Results  Component Value Date   EGFR 69 08/06/2022     Patient is on Vitamin D supplement.   Lab Results  Component Value Date   VD25OH 59 01/31/2022     Last PSA was: Lab Results  Component Value Date   PSA 0.41 01/31/2022    Current Medications:  Current Outpatient Medications on File Prior to Visit  Medication Sig Dispense Refill   acetaminophen (TYLENOL) 325 MG tablet Take 650 mg by mouth every 6 (six) hours as needed.     acyclovir (ZOVIRAX) 400 MG tablet Take 1 tablet 2 x /[place] to prevent fever blisters 180 tablet 3   Ascorbic Acid (VITAMIN C PO)  Take 1 tablet by mouth daily.     aspirin EC 81 MG tablet Take 81 mg by mouth daily. Swallow whole.     atorvastatin (LIPITOR) 80 MG tablet Take  1 tablet  Daily  for Cholesterol to Prevent Heart Attack, Strokes, Impotence  & Dementia 90 tablet 3   Cholecalciferol (VITAMIN D PO) Take 2,000 Units by mouth daily.      Meloxicam 15 MG TBDP Take 15 mg by mouth daily. 30 tablet 0   OVER THE COUNTER MEDICATION Takes an OTC iron tablet daily.     sildenafil (REVATIO) 20 MG tablet TAKE ONE TO FIVE TABLETS BY MOUTH ONCE DAILY AS NEEDED 30 tablet 0   varenicline (CHANTIX CONTINUING MONTH PAK) 1 MG tablet Take 1 tablet (1 mg total) by mouth 2 (two) times daily. 60 tablet 0   No current facility-administered medications on file prior to visit.   Allergies:  No Known Allergies Health Maintenance:  Immunization History  Administered Date(s) Administered   Influenza Inj Mdck Quad With Preservative 09/29/2019   PPD Test 06/12/2017, 08/24/2018, 09/29/2019, 01/23/2021   Td 11/18/2004   Tdap 12/25/2015   Health Maintenance  Topic Date Due   Medicare Annual Wellness (AWV)  Never done   COVID-19 Vaccine (1) Never done   Pneumonia Vaccine 1+ Years old (1 of 2 - PCV) Never done   Hepatitis C Screening  Never done   Zoster Vaccines- Shingrix (1 of 2) Never done   INFLUENZA VACCINE  06/18/2022   DTaP/Tdap/Td (3 - Td or Tdap) 12/24/2025   COLONOSCOPY (Pts 45-10yrs Insurance coverage will need to be confirmed)  10/10/2027   HPV VACCINES  Aged Out     Patient Care Team: Unk Pinto, MD as PCP - General (Internal Medicine)  Medical History:  has SICKLE CELL TRAIT; Anemia of chronic disease; SMOKER; Labile hypertension; Osteoarthritis; Other abnormal glucose (prediabetes); Testosterone deficiency; Hyperlipidemia; Vitamin D deficiency; and Medication management on their problem list. Surgical History:  He  has a past surgical history that includes Hernia repair; Thulium laser TURP (transurethral  resection of prostate) (N/A, 01/05/2019); and Colonoscopy (06/2010). Family History:  His family history includes Cancer in his father; Colon cancer in his father; Colon polyps in his father; Diabetes in his mother; Hypertension in his mother. Social History:   reports that he has been smoking cigarettes. He has a 10.25 pack-year smoking history. He has never used smokeless tobacco. He reports current alcohol use of about 2.0 standard drinks of alcohol per week. He reports that he does not use drugs. Review of Systems:  Review of Systems  Constitutional:  Negative for chills and fever.  HENT:  Positive for congestion. Negative for hearing loss, sinus pain, sore throat and tinnitus.   Eyes:  Negative for blurred  vision and double vision.  Respiratory:  Negative for cough, hemoptysis, sputum production, shortness of breath and wheezing.   Cardiovascular:  Negative for chest pain, palpitations and leg swelling.  Gastrointestinal:  Negative for abdominal pain, constipation, diarrhea, heartburn, nausea and vomiting.  Genitourinary:  Negative for dysuria and urgency.  Musculoskeletal:  Positive for joint pain (left shoulder). Negative for back pain, falls, myalgias and neck pain.  Skin:  Negative for rash.  Neurological:  Negative for dizziness, tingling, tremors, weakness and headaches.  Endo/Heme/Allergies:  Does not bruise/bleed easily.  Psychiatric/Behavioral:  Negative for depression and suicidal ideas. The patient is not nervous/anxious and does not have insomnia.     Physical Exam: Estimated body mass index is 24.62 kg/m as calculated from the following:   Height as of this encounter: 5\' 10"  (1.778 m).   Weight as of this encounter: 171 lb 9.6 oz (77.8 kg). BP 124/70   Pulse 97   Temp (!) 97.5 F (36.4 C)   Ht 5\' 10"  (1.778 m)   Wt 171 lb 9.6 oz (77.8 kg)   SpO2 97%   BMI 24.62 kg/m  General Appearance: Very pleasant thin male, in no apparent distress.  Eyes: PERRLA, EOMs,  conjunctiva no swelling or erythema, normal fundi and vessels.  Sinuses: No Frontal/maxillary tenderness  ENT/Mouth: Ext aud canals clear, normal light reflex with TMs without erythema, bulging. Good dentition. No erythema, swelling, or exudate on post pharynx. Tonsils not swollen or erythematous. Hearing normal.  Neck: Supple, thyroid normal. No bruits  Respiratory: Respiratory effort normal, BS equal bilaterally without rales, rhonchi, wheezing or stridor. . Very raspy voice Cardio: RRR without murmurs, rubs or gallops. Brisk peripheral pulses without edema.  Chest: symmetric, with normal excursions and percussion.  Abdomen: Soft, nontender, no guarding, rebound, hernias, masses, or organomegaly.  Lymphatics: Non tender without lymphadenopathy.  Genitourinary: defer per pt Musculoskeletal: Full ROM all peripheral extremities,5/5 strength, and normal gait. Mild tenderness midline lower back Skin: Warm, dry without rashes, lesions, ecchymosis. Neuro: Cranial nerves intact, reflexes equal bilaterally. Normal muscle tone, no cerebellar symptoms. Sensation intact.  Psych: Awake and oriented X 3, normal affect, Insight and Judgment appropriate.   EKG: NSR, no ST changes. AAA: < 3 cm  Shayleigh Bouldin E Carlye Grippe 3:21 PM [facility]

## 2023-02-05 LAB — URINALYSIS, ROUTINE W REFLEX MICROSCOPIC
Bilirubin Urine: NEGATIVE
Glucose, UA: NEGATIVE
Hgb urine dipstick: NEGATIVE
Ketones, ur: NEGATIVE
Leukocytes,Ua: NEGATIVE
Nitrite: NEGATIVE
Protein, ur: NEGATIVE
Specific Gravity, Urine: 1.009 (ref 1.001–1.035)
pH: 5.5 (ref 5.0–8.0)

## 2023-02-05 LAB — CBC WITH DIFFERENTIAL/PLATELET
Absolute Monocytes: 368 cells/uL (ref 200–950)
Basophils Absolute: 78 cells/uL (ref 0–200)
Basophils Relative: 1.6 %
Eosinophils Absolute: 191 cells/uL (ref 15–500)
Eosinophils Relative: 3.9 %
HCT: 36.2 % — ABNORMAL LOW (ref 38.5–50.0)
Hemoglobin: 12.2 g/dL — ABNORMAL LOW (ref 13.2–17.1)
Lymphs Abs: 2269 cells/uL (ref 850–3900)
MCH: 31.3 pg (ref 27.0–33.0)
MCHC: 33.7 g/dL (ref 32.0–36.0)
MCV: 92.8 fL (ref 80.0–100.0)
MPV: 9.6 fL (ref 7.5–12.5)
Monocytes Relative: 7.5 %
Neutro Abs: 1994 cells/uL (ref 1500–7800)
Neutrophils Relative %: 40.7 %
Platelets: 289 10*3/uL (ref 140–400)
RBC: 3.9 10*6/uL — ABNORMAL LOW (ref 4.20–5.80)
RDW: 12.9 % (ref 11.0–15.0)
Total Lymphocyte: 46.3 %
WBC: 4.9 10*3/uL (ref 3.8–10.8)

## 2023-02-05 LAB — TSH: TSH: 1.17 mIU/L (ref 0.40–4.50)

## 2023-02-05 LAB — HEMOGLOBIN A1C
Hgb A1c MFr Bld: 6 % of total Hgb — ABNORMAL HIGH (ref <5.7)
Mean Plasma Glucose: 126 mg/dL
eAG (mmol/L): 7 mmol/L

## 2023-02-05 LAB — COMPLETE METABOLIC PANEL WITH GFR
AG Ratio: 1.4 (calc) (ref 1.0–2.5)
ALT: 16 U/L (ref 9–46)
AST: 21 U/L (ref 10–35)
Albumin: 4.1 g/dL (ref 3.6–5.1)
Alkaline phosphatase (APISO): 67 U/L (ref 35–144)
BUN: 11 mg/dL (ref 7–25)
CO2: 27 mmol/L (ref 20–32)
Calcium: 9.8 mg/dL (ref 8.6–10.3)
Chloride: 104 mmol/L (ref 98–110)
Creat: 1.07 mg/dL (ref 0.70–1.35)
Globulin: 3 g/dL (calc) (ref 1.9–3.7)
Glucose, Bld: 81 mg/dL (ref 65–99)
Potassium: 4.9 mmol/L (ref 3.5–5.3)
Sodium: 140 mmol/L (ref 135–146)
Total Bilirubin: 0.4 mg/dL (ref 0.2–1.2)
Total Protein: 7.1 g/dL (ref 6.1–8.1)
eGFR: 77 mL/min/{1.73_m2} (ref >=60)

## 2023-02-05 LAB — PSA: PSA: 0.36 ng/mL (ref <=4.00)

## 2023-02-05 LAB — LIPID PANEL
Cholesterol: 141 mg/dL (ref <200)
HDL: 55 mg/dL (ref >=40)
LDL Cholesterol (Calc): 58 mg/dL (calc)
Non-HDL Cholesterol (Calc): 86 mg/dL (calc) (ref <130)
Total CHOL/HDL Ratio: 2.6 (calc) (ref <5.0)
Triglycerides: 214 mg/dL — ABNORMAL HIGH (ref <150)

## 2023-02-05 LAB — MICROALBUMIN / CREATININE URINE RATIO
Creatinine, Urine: 59 mg/dL (ref 20–320)
Microalb Creat Ratio: 7 mg/g creat (ref <30)
Microalb, Ur: 0.4 mg/dL

## 2023-02-05 LAB — VITAMIN D 25 HYDROXY (VIT D DEFICIENCY, FRACTURES): Vit D, 25-Hydroxy: 39 ng/mL (ref 30–100)

## 2023-02-05 LAB — MAGNESIUM: Magnesium: 2.3 mg/dL (ref 1.5–2.5)

## 2023-04-24 DIAGNOSIS — R3912 Poor urinary stream: Secondary | ICD-10-CM | POA: Diagnosis not present

## 2023-05-08 ENCOUNTER — Ambulatory Visit: Payer: Medicare Other | Admitting: Nurse Practitioner

## 2023-05-08 NOTE — Progress Notes (Deleted)
Assessment and Plan:  There are no diagnoses linked to this encounter.    Further disposition pending results of labs. Discussed med's effects and SE's.   Over 30 minutes of exam, counseling, chart review, and critical decision making was performed.   Future Appointments  Date Time Provider Department Center  05/08/2023  4:15 PM Raynelle Dick, NP [facility] None  08/12/2023  9:30 AM Raynelle Dick, NP [facility] None  02/04/2024  3:00 PM Raynelle Dick, NP [facility] None    ------------------------------------------------------------------------------------------------------------------   HPI There were no vitals taken for this visit. 67 y.o.male presents for  Past Medical History:  Diagnosis Date   Labile hypertension    09/25/20- pt states no HTN   Osteoarthritis    Other testicular hypofunction    Prediabetes      No Known Allergies  Current Outpatient Medications on File Prior to Visit  Medication Sig   acetaminophen (TYLENOL) 325 MG tablet Take 650 mg by mouth every 6 (six) hours as needed.   acyclovir (ZOVIRAX) 400 MG tablet Take 1 tablet 2 x /Kn[place]fer to prevent fever blisters   Ascorbic Acid (VITAMIN C PO) Take 1 tablet by mouth daily.   aspirin EC 81 MG tablet Take 81 mg by mouth daily. Swallow whole.   atorvastatin (LIPITOR) 80 MG tablet Take  1 tablet  Daily  for Cholesterol to Prevent Heart Attack, Strokes, Impotence  & Dementia   Cholecalciferol (VITAMIN D PO) Take 2,000 Units by mouth daily.    Meloxicam 15 MG TBDP Take 15 mg by mouth daily.   OVER THE COUNTER MEDICATION Takes an OTC iron tablet daily.   sildenafil (REVATIO) 20 MG tablet TAKE ONE TO FIVE TABLETS BY MOUTH ONCE DAILY AS NEEDED   varenicline (CHANTIX) 1 MG tablet TAKE 1 TABLET(1 MG) BY MOUTH TWICE DAILY   No current facility-administered medications on file prior to visit.    ROS: all negative except above.   Physical Exam:  There were no vitals taken for this visit.  General  Appearance: Well nourished, in no apparent distress. Eyes: PERRLA, EOMs, conjunctiva no swelling or erythema Sinuses: No Frontal/maxillary tenderness ENT/Mouth: Ext aud canals clear, TMs without erythema, bulging. No erythema, swelling, or exudate on post pharynx.  Tonsils not swollen or erythematous. Hearing normal.  Neck: Supple, thyroid normal.  Respiratory: Respiratory effort normal, BS equal bilaterally without rales, rhonchi, wheezing or stridor.  Cardio: RRR with no MRGs. Brisk peripheral pulses without edema.  Abdomen: Soft, + BS.  Non tender, no guarding, rebound, hernias, masses. Lymphatics: Non tender without lymphadenopathy.  Musculoskeletal: Full ROM, 5/5 strength, normal gait.  Skin: Warm, dry without rashes, lesions, ecchymosis.  Neuro: Cranial nerves intact. Normal muscle tone, no cerebellar symptoms. Sensation intact.  Psych: Awake and oriented X 3, normal affect, Insight and Judgment appropriate.     Raynelle Dick, NP 11:47 AM [facility]

## 2023-05-08 NOTE — Progress Notes (Signed)
Assessment and Plan:  Tony Ware was seen today for acute visit.  Diagnoses and all orders for this visit:  Arthritis/ Acute pain of right shoulder Use tylenol arthritis as needed Heat and ice alternating -     dexamethasone (DECADRON) injection 10 mg  Right flank pain Push water, limit alcohol and caffeine Await urine results to determine treatment -     Urinalysis, Routine w reflex microscopic -     Urine Culture       Further disposition pending results of labs. Discussed med's effects and SE's.   Over 30 minutes of exam, counseling, chart review, and critical decision making was performed.   Future Appointments  Date Time Provider Department Center  08/12/2023  9:30 AM Raynelle Dick, NP [facility] None  02/04/2024  3:00 PM Raynelle Dick, NP [facility] None    ------------------------------------------------------------------------------------------------------------------   HPI BP 112/60   Pulse 100   Temp 97.9 F (36.6 C)   Ht 5\' 10"  (1.778 m)   Wt 165 lb 12.8 oz (75.2 kg)   SpO2 97%   BMI 23.79 kg/m  67 y.o.male presents for having right sided back pain x 2 weeks.  Denies dysuria, hematuria, frequency and fever.  He was drinking more caffeine and alcohol and he cut back this week and symptoms have improved slightly  but questions kidney infection.  He has also been having pain in his left shoulder which he describes as arthritis  Has previously had a steroid shot and this helped with pain.   BP well controlled without medication. Denies headaches, chest pain, shortness of breath and dizziness BP Readings from Last 3 Encounters:  05/12/23 112/60  02/04/23 124/70  08/06/22 108/62   BMI is Body mass index is 23.79 kg/m., he has been working on diet and exercise. Wt Readings from Last 3 Encounters:  05/12/23 165 lb 12.8 oz (75.2 kg)  02/04/23 171 lb 9.6 oz (77.8 kg)  08/06/22 153 lb 6.4 oz (69.6 kg)     Past Medical History:  Diagnosis Date   Labile  hypertension    09/25/20- pt states no HTN   Osteoarthritis    Other testicular hypofunction    Prediabetes      No Known Allergies  Current Outpatient Medications on File Prior to Visit  Medication Sig   acetaminophen (TYLENOL) 325 MG tablet Take 650 mg by mouth every 6 (six) hours as needed.   acyclovir (ZOVIRAX) 400 MG tablet Take 1 tablet 2 x /Stutz to prevent fever blisters   Ascorbic Acid (VITAMIN C PO) Take 1 tablet by mouth daily.   aspirin EC 81 MG tablet Take 81 mg by mouth daily. Swallow whole.   atorvastatin (LIPITOR) 80 MG tablet Take  1 tablet  Daily  for Cholesterol to Prevent Heart Attack, Strokes, Impotence  & Dementia   Cholecalciferol (VITAMIN D PO) Take 2,000 Units by mouth daily.    Meloxicam 15 MG TBDP Take 15 mg by mouth daily.   OVER THE COUNTER MEDICATION Takes an OTC iron tablet daily.   sildenafil (REVATIO) 20 MG tablet TAKE ONE TO FIVE TABLETS BY MOUTH ONCE DAILY AS NEEDED   varenicline (CHANTIX) 1 MG tablet TAKE 1 TABLET(1 MG) BY MOUTH TWICE DAILY   No current facility-administered medications on file prior to visit.    ROS: all negative except above.   Physical Exam:  BP 112/60   Pulse 100   Temp 97.9 F (36.6 C)   Ht 5\' 10"  (1.778 m)   Wt  165 lb 12.8 oz (75.2 kg)   SpO2 97%   BMI 23.79 kg/m   General Appearance: Well nourished, in no apparent distress. Eyes: PERRLA, EOMs, conjunctiva no swelling or erythema Neck: Supple, thyroid normal.  Respiratory: Respiratory effort normal, BS equal bilaterally without rales, rhonchi, wheezing or stridor.  Cardio: RRR with no MRGs. Brisk peripheral pulses without edema.  Abdomen: Soft, + BS.  Non tender, no guarding, rebound, hernias, masses. Lymphatics: Non tender without lymphadenopathy.  Musculoskeletal: Full ROM, 5/5 strength, normal gait. Crepitus of left shoulder when raising arm laterally. - CVA tenderness Skin: Warm, dry without rashes, lesions, ecchymosis.  Neuro: Cranial nerves intact. Normal  muscle tone, no cerebellar symptoms. Sensation intact.  Psych: Awake and oriented X 3, normal affect, Insight and Judgment appropriate.     Raynelle Dick, NP 2:32 PM [facility]

## 2023-05-12 ENCOUNTER — Encounter: Payer: Self-pay | Admitting: Nurse Practitioner

## 2023-05-12 ENCOUNTER — Ambulatory Visit (INDEPENDENT_AMBULATORY_CARE_PROVIDER_SITE_OTHER): Payer: Medicare Other | Admitting: Nurse Practitioner

## 2023-05-12 VITALS — BP 112/60 | HR 100 | Temp 97.9°F | Ht 70.0 in | Wt 165.8 lb

## 2023-05-12 DIAGNOSIS — R109 Unspecified abdominal pain: Secondary | ICD-10-CM

## 2023-05-12 DIAGNOSIS — M1990 Unspecified osteoarthritis, unspecified site: Secondary | ICD-10-CM | POA: Diagnosis not present

## 2023-05-12 DIAGNOSIS — M25511 Pain in right shoulder: Secondary | ICD-10-CM

## 2023-05-12 MED ORDER — DEXAMETHASONE SODIUM PHOSPHATE 10 MG/ML IJ SOLN
10.0000 mg | Freq: Once | INTRAMUSCULAR | Status: AC
  Administered 2023-05-12: 10 mg via INTRAMUSCULAR

## 2023-05-12 NOTE — Patient Instructions (Signed)
Arthritis Arthritis means joint pain. It can also mean joint disease. A joint is a place where bones come together. There are more than 100 types of arthritis. What are the causes? Wear and tear of a joint. This is the most common cause. Too much of a chemical called uric acid in the blood, which leads to pain in the joint (gout). Pain and swelling (inflammation) in a joint. Infection of a joint. Injuries in the joint. A reaction to medicines (allergy). In some cases, the cause may not be known. What are the signs or symptoms? Pain in a joint when moving. Redness at a joint. Swelling at a joint. Stiffness at a joint. Warmth coming from the joint. A fever. A feeling of being sick. How is this treated? This condition may be treated with: Treating the cause, if it is known. Rest. Raising (elevating) the joint. Putting cold or hot packs on the joint. Medicines to treat symptoms and reduce pain and swelling. Shots (injections) of medicines, such as cortisone, into the joint. You may also be told to make changes in your life, such as doing exercises and losing weight. Follow these instructions at home: Medicines Take over-the-counter and prescription medicines only as told by your doctor. Do not take aspirin for pain if your doctor says that you may have gout. Activity Rest your joint if your doctor tells you to. Avoid activities that make the pain worse. Exercise your joint regularly as told by your doctor. Try doing exercises like: Swimming. Water aerobics. Biking. Walking. Managing pain, stiffness, and swelling     If told, put ice on the affected area. To do this: Put ice in a plastic bag. Place a towel between your skin and the bag. Leave the ice on for 20 minutes, 2-3 times a [place]. Take off the ice if your skin turns bright red. This is very important. If you cannot feel pain, heat, or cold, you have a greater risk of damage to the area. If your joint is swollen, raise  (elevate) it above the level of your heart if told by your doctor. If your joint feels stiff in the morning, try taking a warm shower. If told, put heat on the affected area. Do this as often as told by your doctor. Use the heat source that your doctor recommends, such as a moist heat pack or a heating pad. If you have diabetes, do not apply heat without asking your doctor. To apply heat: Place a towel between your skin and the heat source. Leave the heat on for 20-30 minutes. Take off the heat if your skin turns bright red. This is very important. If you cannot feel pain, heat, or cold, you have a greater risk of getting burned. General instructions Maintain a healthy weight. Follow instructions from your doctor for weight control. Do not smoke or use any products that contain nicotine or tobacco. If you need help quitting, ask your doctor. Keep all follow-up visits. Where to find more information [employer] of [employer]: www.niams.nih.gov Contact a doctor if: The pain gets worse. You have a fever. Get help right away if: You have very bad pain in your joint. You have swelling in your joint. Your joint is red. Many joints become painful and swollen. You have very bad back pain. Your leg is very weak. Summary Arthritis means joint pain. It can also mean joint disease. A joint is a place where bones come together. The most common cause of this condition is wear and   tear of a joint. Symptoms of this condition include redness, swelling, or stiffness of the joint. This condition is treated with rest, raising the joint, medicines, and putting cold or hot packs on the joint. Follow your doctor's instructions about medicines, activity, exercises, and other home care treatments. This information is not intended to replace advice given to you by your health care provider. Make sure you discuss any questions you have with your health care provider. Document Revised: 08/14/2021 Document  Reviewed: 08/14/2021 Elsevier Patient Education  2024 [employer]  

## 2023-05-13 LAB — URINALYSIS, ROUTINE W REFLEX MICROSCOPIC
Bilirubin Urine: NEGATIVE
Glucose, UA: NEGATIVE
Hgb urine dipstick: NEGATIVE
Ketones, ur: NEGATIVE
Leukocytes,Ua: NEGATIVE
Nitrite: NEGATIVE
Protein, ur: NEGATIVE
Specific Gravity, Urine: 1.008 (ref 1.001–1.035)
pH: 5.5 (ref 5.0–8.0)

## 2023-05-13 LAB — URINE CULTURE
MICRO NUMBER:: 15120236
Result:: NO GROWTH
SPECIMEN QUALITY:: ADEQUATE

## 2023-05-30 ENCOUNTER — Encounter: Payer: Self-pay | Admitting: Nurse Practitioner

## 2023-05-30 ENCOUNTER — Ambulatory Visit (INDEPENDENT_AMBULATORY_CARE_PROVIDER_SITE_OTHER): Payer: Medicare Other | Admitting: Nurse Practitioner

## 2023-05-30 VITALS — BP 128/82 | HR 78 | Temp 97.2°F | Ht 70.0 in | Wt 169.6 lb

## 2023-05-30 DIAGNOSIS — R109 Unspecified abdominal pain: Secondary | ICD-10-CM

## 2023-05-30 DIAGNOSIS — R300 Dysuria: Secondary | ICD-10-CM | POA: Diagnosis not present

## 2023-05-30 DIAGNOSIS — Z789 Other specified health status: Secondary | ICD-10-CM | POA: Diagnosis not present

## 2023-05-30 MED ORDER — CIPROFLOXACIN HCL 250 MG PO TABS
ORAL_TABLET | ORAL | 0 refills | Status: DC
Start: 2023-05-30 — End: 2023-08-12

## 2023-05-30 NOTE — Progress Notes (Signed)
Assessment and Plan:  Tony Ware was seen today for an episodic visit.  Diagnoses and all order for this visit:  Right flank pain/dysuria Start prophylactic treatment with abx Stay well hydrated to keep urinary system well flushed Consider daily cranberry juice or oral supplement to help any bacteria from adhering to bladder wall causing increase for infection. Monitor for any increase in fever, chills, N/V, abdominal pain, hematuria.   Contact office or report to ER for further evaluation if s/s fail to improve or any sign of worsening infection as noted above.  - ciprofloxacin (CIPRO) 250 MG tablet; Take 1 tablet 2 x /Boulet with Food for Infection  Dispense: 14 tablet; Refill: 0 - Urinalysis, Routine w reflex microscopic - Urine Culture  Alcohol use Decrease use of alcohol Continue to stay well hydrated.  Notify office for further evaluation and treatment, questions or concerns if s/s fail to improve. The risks and benefits of my recommendations, as well as other treatment options were discussed with the patient today. Questions were answered.  Further disposition pending results of labs. Discussed med's effects and SE's.    Over 15 minutes of exam, counseling, chart review, and critical decision making was performed.   Future Appointments  Date Time Provider Department Center  08/12/2023  9:30 AM Raynelle Dick, NP [facility] None  02/04/2024  3:00 PM Raynelle Dick, NP [facility] None    ------------------------------------------------------------------------------------------------------------------   HPI BP 128/82   Pulse 78   Temp (!) 97.2 F (36.2 C)   Ht 5\' 10"  (1.778 m)   Wt 169 lb 9.6 oz (76.9 kg)   SpO2 98%   BMI 24.34 kg/m    Patient complains of dysuria and right flank pain. He has had symptoms for 3 weeks. Patient also complains of back pain. Patient denies fever, chills, abdominal pain, N/V. Patient does not have a history of recurrent UTI. Patient  does not have a history of pyelonephritis. States that his father passed away a few months ago and he has been drinking alcohol more than usual.     Past Medical History:  Diagnosis Date   Labile hypertension    09/25/20- pt states no HTN   Osteoarthritis    Other testicular hypofunction    Prediabetes      No Known Allergies  Current Outpatient Medications on File Prior to Visit  Medication Sig   acetaminophen (TYLENOL) 325 MG tablet Take 650 mg by mouth every 6 (six) hours as needed.   Ascorbic Acid (VITAMIN C PO) Take 1 tablet by mouth daily.   aspirin EC 81 MG tablet Take 81 mg by mouth daily. Swallow whole.   atorvastatin (LIPITOR) 80 MG tablet Take  1 tablet  Daily  for Cholesterol to Prevent Heart Attack, Strokes, Impotence  & Dementia   Cholecalciferol (VITAMIN D PO) Take 2,000 Units by mouth daily.    Meloxicam 15 MG TBDP Take 15 mg by mouth daily.   OVER THE COUNTER MEDICATION Takes an OTC iron tablet daily.   sildenafil (REVATIO) 20 MG tablet TAKE ONE TO FIVE TABLETS BY MOUTH ONCE DAILY AS NEEDED   No current facility-administered medications on file prior to visit.    ROS: all negative except what is noted in the HPI.   Physical Exam:  BP 128/82   Pulse 78   Temp (!) 97.2 F (36.2 C)   Ht 5\' 10"  (1.778 m)   Wt 169 lb 9.6 oz (76.9 kg)   SpO2 98%   BMI  24.34 kg/m   General Appearance: NAD.  Awake, conversant and cooperative. Eyes: PERRLA, EOMs intact.  Sclera white.  Conjunctiva without erythema. Sinuses: No frontal/maxillary tenderness.  No nasal discharge. Nares patent.  ENT/Mouth: Ext aud canals clear.  Bilateral TMs w/DOL and without erythema or bulging. Hearing intact.  Posterior pharynx without swelling or exudate.  Tonsils without swelling or erythema.  Neck: Supple.  No masses, nodules or thyromegaly. Respiratory: Effort is regular with non-labored breathing. Breath sounds are equal bilaterally without rales, rhonchi, wheezing or stridor.  Cardio: RRR  with no MRGs. Brisk peripheral pulses without edema.  Abdomen: Active BS in all four quadrants.  Soft and non-tender without guarding, rebound tenderness, hernias or masses. Lymphatics: Non tender without lymphadenopathy.  Musculoskeletal: Full ROM, 5/5 strength, normal ambulation.  No clubbing or cyanosis. Skin: Appropriate color for ethnicity. Warm without rashes, lesions, ecchymosis, ulcers.  Neuro: CN II-XII grossly normal. Normal muscle tone without cerebellar symptoms and intact sensation.   Psych: AO X 3,  appropriate mood and affect, insight and judgment.     Adela Glimpse, NP 11:45 AM [facility]

## 2023-05-30 NOTE — Patient Instructions (Signed)
Continue to monitor for worsening symptoms that could turn into Pyelonephritis.    Pyelonephritis, Adult  Pyelonephritis is an infection in the kidney. The kidneys are the parts of the body that help clean the blood. They move waste out of the blood and into the pee (urine). This infection can happen fast, or it can last for a long time. In most cases, it clears up with treatment and does not cause other problems. What are the causes? This infection may be caused by germs (bacteria). The germs may go: From your bladder up into your kidney. This may happen after you have a bladder infection. From your blood into your kidney. What increases the risk? You are more likely to get this condition if: You are pregnant. You are older. You have: Diabetes. Prostatitis. This is irritation and swelling of the prostate gland. Kidney stones or bladder stones. Other problems with your kidney or the parts of your body that carry pee from the kidneys to the bladder (ureters). Cancer. You have a soft tube called a catheter in your bladder. You are sexually active. You use a medicine that kills sperm (spermicide). This may be used to prevent pregnancy. You have had a urinary tract infection (UTI) before. What are the signs or symptoms? Peeing often. Feeling the need to pee right away. A burning or stinging feeling when you pee. Pain in your belly, back, or side. Fever or chills. Vomiting or feeling like you may vomit. Dark pee or blood in your pee. How is this treated? You may be given antibiotics to take. You will need to drink lots of fluids. If the infection is bad, you may need to stay in the hospital. You may be given antibiotics and fluids through an IV. In some cases, other treatments may be needed. Follow these instructions at home: Eating and drinking Drink enough fluid to keep your pee pale yellow. Avoid caffeine, tea, and drinks that are bubbly (carbonated). General instructions Take  over-the-counter and prescription medicines as told by your doctor. Finish your antibiotics even if you start to feel better. Pee (urinate) often. Do not hold in your pee for long periods of time. Pee before and after you have sex. If you are male, wipe from front to back after you poop (have a bowel movement). Use each tissue only once. Keep all follow-up visits. Your doctor will want to make sure that you are getting better. Contact a doctor if: You do not feel better after 2 days. Your symptoms get worse. You have a fever or chills. You cannot take your medicine. Get help right away if: You vomit each time that you eat or drink. You have very bad pain in your back or side. You are very weak, or you faint. This information is not intended to replace advice given to you by your health care provider. Make sure you discuss any questions you have with your health care provider. Document Revised: 05/27/2022 Document Reviewed: 05/27/2022 Elsevier Patient Education  2024 [employer].

## 2023-05-31 LAB — URINE CULTURE
MICRO NUMBER:: 15194616
Result:: NO GROWTH
SPECIMEN QUALITY:: ADEQUATE

## 2023-05-31 LAB — URINALYSIS, ROUTINE W REFLEX MICROSCOPIC
Bilirubin Urine: NEGATIVE
Glucose, UA: NEGATIVE
Hgb urine dipstick: NEGATIVE
Ketones, ur: NEGATIVE
Leukocytes,Ua: NEGATIVE
Nitrite: NEGATIVE
Protein, ur: NEGATIVE
Specific Gravity, Urine: 1.005 (ref 1.001–1.035)
pH: 6.5 (ref 5.0–8.0)

## 2023-08-11 NOTE — Progress Notes (Unsigned)
6 MONTH FOLLOW UP  Assessment and Plan:   Tony Ware was seen today for follow-up.  Diagnoses and all orders for this visit:  Labile hypertension - currently controlled without medications.  Continue DASH diet, exercise and monitor at home. Call if greater than 130/80.  -     COMPLETE METABOLIC PANEL WITH GFR -     CBC with Differential/Platelet  Hyperlipidemia, mixed Continue Atorvastatin, diet and exercise -     COMPLETE METABOLIC PANEL WITH GFR -     Lipid panel  Abnormal glucose Continue diet and exercise -     Hemoglobin A1c  Vitamin D deficiency Continue Vit D supplementation to maintain value in therapeutic level of 60-100   Medication management -     COMPLETE METABOLIC PANEL WITH GFR -     Lipid panel -     Hemoglobin A1c -     CBC with Differential/Platelet - TSH  Left arm Paresthesia Try Gabapentin 100 mg at bedtime If symptoms worsen notify the office and may need nerve conduction study  Sickle-cell trait (HCC) - CBC Continue to monitor  Smoker Counseling on smoking cessation of 10 minutes today  Chantix prescribed and used in the past Trying nicotine patches to stop smoking Currently 6-7 cigarettes a Neilan    Continue diet and meds as discussed. Further disposition pending results of labs. Over 30 minutes of exam, counseling, chart review, and critical decision making was performed Future Appointments  Date Time Provider Department Center  02/04/2024  3:00 PM Raynelle Dick, NP [facility] None    HPI 67 y.o. AA male  presents for 6 month follow up on hypertension, cholesterol, prediabetes, and vitamin D deficiency.   He has noticed a pain in his left arm - describes as a tingling/ numbness that will radiate down arm to hand.  Mostly when lying down at night. 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  He denies chest pain, shortness of breath, dizziness.   BMI is Body mass index is 24.16 kg/m., he is working on diet and exercise. Tries to limit saturated fats somewhat.  Wt Readings from Last 3 Encounters:  08/12/23 168 lb 6.4 oz (76.4 kg)  05/30/23 169 lb 9.6 oz (76.9 kg)  05/12/23 165 lb 12.8 oz (75.2 kg)   Has continues to smoke 6-7 cigarettes a [place]. Last chest xray 2023. 12 pack year history. Started back to smoking after he lost his daughter in 2013. Started nicotine patches. Has tried to use chantix in the past.     He is on cholesterol medication, Atorvastatin 80 mg QD,  and denies myalgias. His cholesterol is at goal. The cholesterol last visit was:   Lab Results  Component Value Date   CHOL 141 02/04/2023   HDL 55 02/04/2023   LDLCALC 58 02/04/2023   TRIG 214 (H) 02/04/2023   CHOLHDL 2.6 02/04/2023    He has been working on diet and exercise for prediabetes, and denies paresthesia of the feet, polydipsia, polyuria and visual disturbances. Last A1C in the office was:  Lab Results  Component Value Date   HGBA1C 6.0 (H) 02/04/2023   Patient is on Vitamin D supplement.   Lab Results  Component Value  Date   VD25OH 39 02/04/2023      Current Medications:    Current Outpatient Medications (Cardiovascular):    atorvastatin (LIPITOR) 80 MG tablet, Take  1 tablet  Daily  for Cholesterol to Prevent Heart Attack, Strokes, Impotence  & Dementia   sildenafil (REVATIO) 20 MG tablet, TAKE ONE TO FIVE TABLETS BY MOUTH ONCE DAILY AS NEEDED   Current Outpatient Medications (Analgesics):    acetaminophen (TYLENOL) 325 MG tablet, Take 650 mg by mouth every 6 (six) hours as needed.   aspirin EC 81 MG tablet, Take 81 mg by mouth daily. Swallow whole.   Current Outpatient Medications (Other):    Ascorbic Acid (VITAMIN C PO), Take 1 tablet by mouth daily.   Cholecalciferol (VITAMIN D  PO), Take 2,000 Units by mouth daily.    OVER THE COUNTER MEDICATION, Takes an OTC iron tablet daily.  Medical History:  Past Medical History:  Diagnosis Date   Labile hypertension    09/25/20- pt states no HTN   Osteoarthritis    Other testicular hypofunction    Prediabetes    Allergies: No Known Allergies   Review of Systems:  Review of Systems  Constitutional:  Negative for chills, fever, malaise/fatigue and weight loss.  HENT:  Negative for congestion, ear discharge, ear pain, hearing loss, nosebleeds, sore throat and tinnitus.   Eyes: Negative.  Negative for blurred vision and double vision.  Respiratory: Negative.  Negative for cough, shortness of breath and stridor.   Cardiovascular: Negative.  Negative for chest pain, palpitations, orthopnea and leg swelling.  Gastrointestinal:  Negative for abdominal pain, blood in stool, constipation, diarrhea, heartburn, melena, nausea and vomiting.  Genitourinary: Negative.   Musculoskeletal:  Negative for falls, joint pain and myalgias.  Skin: Negative.  Negative for rash.  Neurological:  Positive for tingling (left arm). Negative for dizziness, tremors, loss of consciousness, weakness and headaches.  Endo/Heme/Allergies: Negative.   Psychiatric/Behavioral: Negative.  Negative for depression, memory loss and suicidal ideas.     Family history- Review and unchanged Social history- Review and unchanged Physical Exam: BP 104/62   Pulse 94   Temp (!) 97.2 F (36.2 C)   Ht 5\' 10"  (1.778 m)   Wt 168 lb 6.4 oz (76.4 kg)   SpO2 98%   BMI 24.16 kg/m  Wt Readings from Last 3 Encounters:  08/12/23 168 lb 6.4 oz (76.4 kg)  05/30/23 169 lb 9.6 oz (76.9 kg)  05/12/23 165 lb 12.8 oz (75.2 kg)   General Appearance: Pleasant thin male, in no apparent distress. Raspy voice Eyes: PERRLA, EOMs, conjunctiva no swelling or erythema Sinuses: No Frontal/maxillary tenderness ENT/Mouth: Ext aud canals clear, TMs without erythema, bulging. No  erythema, swelling, or exudate on post pharynx.  Tonsils not swollen or erythematous. Hearing normal.  Neck: Supple, thyroid normal.  Respiratory: Respiratory effort normal, BS equal bilaterally without rales, rhonchi, wheezing or stridor.  Cardio: RRR with no MRGs. Brisk peripheral pulses without edema.  Abdomen: Soft, + BS,  Lymphatics: Non tender without lymphadenopathy.  Musculoskeletal: Full ROM, 5/5 strength. Strength is normal and symmetric in arms. Skin:  Warm, dry without rashes, lesions, ecchymosis.  Neuro: Cranial nerves intact. Normal muscle tone, no cerebellar symptoms. Psych: Awake and oriented X 3, normal affect, Insight and Judgment appropriate.       Raynelle Dick, NP 9:38 AM [facility]

## 2023-08-12 ENCOUNTER — Ambulatory Visit (INDEPENDENT_AMBULATORY_CARE_PROVIDER_SITE_OTHER): Payer: Medicare Other | Admitting: Nurse Practitioner

## 2023-08-12 ENCOUNTER — Encounter: Payer: Self-pay | Admitting: Nurse Practitioner

## 2023-08-12 VITALS — BP 104/62 | HR 94 | Temp 97.2°F | Ht 70.0 in | Wt 168.4 lb

## 2023-08-12 DIAGNOSIS — R7309 Other abnormal glucose: Secondary | ICD-10-CM | POA: Diagnosis not present

## 2023-08-12 DIAGNOSIS — F17200 Nicotine dependence, unspecified, uncomplicated: Secondary | ICD-10-CM

## 2023-08-12 DIAGNOSIS — Z79899 Other long term (current) drug therapy: Secondary | ICD-10-CM | POA: Diagnosis not present

## 2023-08-12 DIAGNOSIS — D573 Sickle-cell trait: Secondary | ICD-10-CM

## 2023-08-12 DIAGNOSIS — E782 Mixed hyperlipidemia: Secondary | ICD-10-CM | POA: Diagnosis not present

## 2023-08-12 DIAGNOSIS — R202 Paresthesia of skin: Secondary | ICD-10-CM

## 2023-08-12 DIAGNOSIS — R0989 Other specified symptoms and signs involving the circulatory and respiratory systems: Secondary | ICD-10-CM | POA: Diagnosis not present

## 2023-08-12 DIAGNOSIS — E559 Vitamin D deficiency, unspecified: Secondary | ICD-10-CM

## 2023-08-12 MED ORDER — GABAPENTIN 100 MG PO CAPS
100.0000 mg | ORAL_CAPSULE | Freq: Every day | ORAL | 2 refills | Status: AC
Start: 2023-08-12 — End: ?

## 2023-08-12 NOTE — Patient Instructions (Signed)
Strongly encourage to quit smoking- use nicotine patches.  If want to retry Chantix please notify the office  Managing the Challenge of Quitting Smoking Quitting smoking is a physical and mental challenge. You may have cravings, withdrawal symptoms, and temptation to smoke. Before quitting, work with your health care provider to make a plan that can help you manage quitting. Making a plan before you quit may keep you from smoking when you have the urge to smoke while trying to quit. How to manage lifestyle changes Managing stress Stress can make you want to smoke, and wanting to smoke may cause stress. It is important to find ways to manage your stress. You could try some of the following: Practice relaxation techniques. Breathe slowly and deeply, in through your nose and out through your mouth. Listen to music. Soak in a bath or take a shower. Imagine a peaceful place or vacation. Get some support. Talk with family or friends about your stress. Join a support group. Talk with a counselor or therapist. Get some physical activity. Go for a walk, run, or bike ride. Play a favorite sport. Practice yoga.  Medicines Talk with your health care provider about medicines that might help you deal with cravings and make quitting easier for you. Relationships Social situations can be difficult when you are quitting smoking. To manage this, you can: Avoid parties and other social situations where people might be smoking. Avoid alcohol. Leave right away if you have the urge to smoke. Explain to your family and friends that you are quitting smoking. Ask for support and let them know you might be a bit grumpy. Plan activities where smoking is not an option. General instructions Be aware that many people gain weight after they quit smoking. However, not everyone does. To keep from gaining weight, have a plan in place before you quit, and stick to the plan after you quit. Your plan should  include: Eating healthy snacks. When you have a craving, it may help to: Eat popcorn, or try carrots, celery, or other cut vegetables. Chew sugar-free gum. Changing how you eat. Eat small portion sizes at meals. Eat 4-6 small meals throughout the [place] instead of 1-2 large meals a [place]. Be mindful when you eat. You should avoid watching television or doing other things that might distract you as you eat. Exercising regularly. Make time to exercise each [place]. If you do not have time for a long workout, do short bouts of exercise for 5-10 minutes several times a [place]. Do some form of strengthening exercise, such as weight lifting. Do some exercise that gets your heart beating and causes you to breathe deeply, such as walking fast, running, swimming, or biking. This is very important. Drinking plenty of water or other low-calorie or no-calorie drinks. Drink enough fluid to keep your urine pale yellow.  How to recognize withdrawal symptoms Your body and mind may experience discomfort as you try to get used to not having nicotine in your system. These effects are called withdrawal symptoms. They may include: Feeling hungrier than normal. Having trouble concentrating. Feeling irritable or restless. Having trouble sleeping. Feeling depressed. Craving a cigarette. These symptoms may surprise you, but they are normal to have when quitting smoking. To manage withdrawal symptoms: Avoid places, people, and activities that trigger your cravings. Remember why you want to quit. Get plenty of sleep. Avoid coffee and other drinks that contain caffeine. These may worsen some of your symptoms. How to manage cravings Come up with a plan  for how to deal with your cravings. The plan should include the following: A definition of the specific situation you want to deal with. An activity or action you will take to replace smoking. A clear idea for how this action will help. The name of someone who could help you  with this. Cravings usually last for 5-10 minutes. Consider taking the following actions to help you with your plan to deal with cravings: Keep your mouth busy. Chew sugar-free gum. Suck on hard candies or a straw. Brush your teeth. Keep your hands and body busy. Change to a different activity right away. Squeeze or play with a ball. Do an activity or a hobby, such as making bead jewelry, practicing needlepoint, or working with wood. Mix up your normal routine. Take a short exercise break. Go for a quick walk, or run up and down stairs. Focus on doing something kind or helpful for someone else. Call a friend or family member to talk during a craving. Join a support group. Contact a quitline. Where to find support To get help or find a support group: Call the National Cancer Institute's Smoking Quitline: 1-800-QUIT-NOW 8582432926) Text QUIT to SmokefreeTXT: 454098 Where to find more information Visit these websites to find more information on quitting smoking: U.S. Department of Health and Human Services: www.smokefree.gov American Lung Association: www.freedomfromsmoking.org Centers for Disease Control and Prevention (CDC): FootballExhibition.com.br American Heart Association: www.heart.org Contact a health care provider if: You want to change your plan for quitting. The medicines you are taking are not helping. Your eating feels out of control or you cannot sleep. You feel depressed or become very anxious. Summary Quitting smoking is a physical and mental challenge. You will face cravings, withdrawal symptoms, and temptation to smoke again. Preparation can help you as you go through these challenges. Try different techniques to manage stress, handle social situations, and prevent weight gain. You can deal with cravings by keeping your mouth busy (such as by chewing gum), keeping your hands and body busy, calling family or friends, or contacting a quitline for people who want to quit smoking. You  can deal with withdrawal symptoms by avoiding places where people smoke, getting plenty of rest, and avoiding drinks that contain caffeine. This information is not intended to replace advice given to you by your health care provider. Make sure you discuss any questions you have with your health care provider. Document Revised: 10/26/2021 Document Reviewed: 10/26/2021 Elsevier Patient Education  2024 [employer].

## 2023-08-13 LAB — COMPLETE METABOLIC PANEL WITH GFR
AG Ratio: 1.6 (calc) (ref 1.0–2.5)
ALT: 18 U/L (ref 9–46)
AST: 21 U/L (ref 10–35)
Albumin: 4.1 g/dL (ref 3.6–5.1)
Alkaline phosphatase (APISO): 60 U/L (ref 35–144)
BUN: 9 mg/dL (ref 7–25)
CO2: 28 mmol/L (ref 20–32)
Calcium: 9.6 mg/dL (ref 8.6–10.3)
Chloride: 104 mmol/L (ref 98–110)
Creat: 1.03 mg/dL (ref 0.70–1.35)
Globulin: 2.5 g/dL (calc) (ref 1.9–3.7)
Glucose, Bld: 91 mg/dL (ref 65–99)
Potassium: 4.4 mmol/L (ref 3.5–5.3)
Sodium: 138 mmol/L (ref 135–146)
Total Bilirubin: 0.5 mg/dL (ref 0.2–1.2)
Total Protein: 6.6 g/dL (ref 6.1–8.1)
eGFR: 80 mL/min/{1.73_m2} (ref >=60)

## 2023-08-13 LAB — CBC WITH DIFFERENTIAL/PLATELET
Absolute Monocytes: 378 cells/uL (ref 200–950)
Basophils Absolute: 72 cells/uL (ref 0–200)
Basophils Relative: 1.6 %
Eosinophils Absolute: 239 cells/uL (ref 15–500)
Eosinophils Relative: 5.3 %
HCT: 37.6 % — ABNORMAL LOW (ref 38.5–50.0)
Hemoglobin: 12 g/dL — ABNORMAL LOW (ref 13.2–17.1)
Lymphs Abs: 1926 cells/uL (ref 850–3900)
MCH: 30.8 pg (ref 27.0–33.0)
MCHC: 31.9 g/dL — ABNORMAL LOW (ref 32.0–36.0)
MCV: 96.7 fL (ref 80.0–100.0)
MPV: 9.4 fL (ref 7.5–12.5)
Monocytes Relative: 8.4 %
Neutro Abs: 1886 cells/uL (ref 1500–7800)
Neutrophils Relative %: 41.9 %
Platelets: 298 10*3/uL (ref 140–400)
RBC: 3.89 10*6/uL — ABNORMAL LOW (ref 4.20–5.80)
RDW: 13.1 % (ref 11.0–15.0)
Total Lymphocyte: 42.8 %
WBC: 4.5 10*3/uL (ref 3.8–10.8)

## 2023-08-13 LAB — TSH: TSH: 1.13 mIU/L (ref 0.40–4.50)

## 2023-08-13 LAB — LIPID PANEL
Cholesterol: 150 mg/dL (ref <200)
HDL: 65 mg/dL (ref >=40)
LDL Cholesterol (Calc): 68 mg/dL (calc)
Non-HDL Cholesterol (Calc): 85 mg/dL (calc) (ref <130)
Total CHOL/HDL Ratio: 2.3 (calc) (ref <5.0)
Triglycerides: 86 mg/dL (ref <150)

## 2023-08-13 LAB — HEMOGLOBIN A1C W/OUT EAG: Hgb A1c MFr Bld: 6 % of total Hgb — ABNORMAL HIGH (ref <5.7)

## 2024-01-29 ENCOUNTER — Other Ambulatory Visit: Payer: Self-pay

## 2024-01-29 MED ORDER — ATORVASTATIN CALCIUM 80 MG PO TABS: ORAL_TABLET | ORAL | 0 refills | Status: DC

## 2024-02-04 ENCOUNTER — Encounter: Payer: Medicare Other | Admitting: Nurse Practitioner

## 2024-04-16 ENCOUNTER — Encounter: Payer: Self-pay | Admitting: Family Medicine

## 2024-04-16 ENCOUNTER — Ambulatory Visit: Admitting: Family Medicine

## 2024-04-16 ENCOUNTER — Ambulatory Visit: Payer: Self-pay | Admitting: Family Medicine

## 2024-04-16 VITALS — BP 142/90 | Temp 98.6°F | Ht 70.0 in | Wt 170.2 lb

## 2024-04-16 DIAGNOSIS — Z79899 Other long term (current) drug therapy: Secondary | ICD-10-CM | POA: Diagnosis not present

## 2024-04-16 DIAGNOSIS — M158 Other polyosteoarthritis: Secondary | ICD-10-CM

## 2024-04-16 DIAGNOSIS — F17200 Nicotine dependence, unspecified, uncomplicated: Secondary | ICD-10-CM | POA: Diagnosis not present

## 2024-04-16 DIAGNOSIS — E349 Endocrine disorder, unspecified: Secondary | ICD-10-CM

## 2024-04-16 DIAGNOSIS — D573 Sickle-cell trait: Secondary | ICD-10-CM | POA: Diagnosis not present

## 2024-04-16 DIAGNOSIS — E782 Mixed hyperlipidemia: Secondary | ICD-10-CM | POA: Diagnosis not present

## 2024-04-16 DIAGNOSIS — R7303 Prediabetes: Secondary | ICD-10-CM | POA: Diagnosis not present

## 2024-04-16 DIAGNOSIS — B009 Herpesviral infection, unspecified: Secondary | ICD-10-CM

## 2024-04-16 DIAGNOSIS — K648 Other hemorrhoids: Secondary | ICD-10-CM

## 2024-04-16 DIAGNOSIS — E559 Vitamin D deficiency, unspecified: Secondary | ICD-10-CM

## 2024-04-16 LAB — LIPID PANEL
Cholesterol: 156 mg/dL (ref 0–200)
HDL: 61.2 mg/dL (ref >39.00)
LDL Cholesterol: 73 mg/dL (ref 0–99)
NonHDL: 94.32
Total CHOL/HDL Ratio: 3
Triglycerides: 109 mg/dL (ref 0.0–149.0)
VLDL: 21.8 mg/dL (ref 0.0–40.0)

## 2024-04-16 LAB — CBC WITH DIFFERENTIAL/PLATELET
Basophils Absolute: 0.1 10*3/uL (ref 0.0–0.1)
Basophils Relative: 2 % (ref 0.0–3.0)
Eosinophils Absolute: 0.2 10*3/uL (ref 0.0–0.7)
Eosinophils Relative: 5.1 % — ABNORMAL HIGH (ref 0.0–5.0)
HCT: 38.5 % — ABNORMAL LOW (ref 39.0–52.0)
Hemoglobin: 12.9 g/dL — ABNORMAL LOW (ref 13.0–17.0)
Lymphocytes Relative: 43.5 % (ref 12.0–46.0)
Lymphs Abs: 1.9 10*3/uL (ref 0.7–4.0)
MCHC: 33.5 g/dL (ref 30.0–36.0)
MCV: 95.4 fl (ref 78.0–100.0)
Monocytes Absolute: 0.4 10*3/uL (ref 0.1–1.0)
Monocytes Relative: 9.6 % (ref 3.0–12.0)
Neutro Abs: 1.8 10*3/uL (ref 1.4–7.7)
Neutrophils Relative %: 39.8 % — ABNORMAL LOW (ref 43.0–77.0)
Platelets: 305 10*3/uL (ref 150.0–400.0)
RBC: 4.03 Mil/uL — ABNORMAL LOW (ref 4.22–5.81)
RDW: 13.7 % (ref 11.5–15.5)
WBC: 4.4 10*3/uL (ref 4.0–10.5)

## 2024-04-16 LAB — COMPREHENSIVE METABOLIC PANEL WITH GFR
ALT: 19 U/L (ref 0–53)
AST: 24 U/L (ref 0–37)
Albumin: 4.3 g/dL (ref 3.5–5.2)
Alkaline Phosphatase: 53 U/L (ref 39–117)
BUN: 10 mg/dL (ref 6–23)
CO2: 31 meq/L (ref 19–32)
Calcium: 9.5 mg/dL (ref 8.4–10.5)
Chloride: 102 meq/L (ref 96–112)
Creatinine, Ser: 1.18 mg/dL (ref 0.40–1.50)
GFR: 63.76 mL/min (ref >60.00)
Glucose, Bld: 96 mg/dL (ref 70–99)
Potassium: 4.7 meq/L (ref 3.5–5.1)
Sodium: 139 meq/L (ref 135–145)
Total Bilirubin: 0.3 mg/dL (ref 0.2–1.2)
Total Protein: 7.5 g/dL (ref 6.0–8.3)

## 2024-04-16 LAB — VITAMIN B12: Vitamin B-12: 484 pg/mL (ref 211–911)

## 2024-04-16 LAB — VITAMIN D 25 HYDROXY (VIT D DEFICIENCY, FRACTURES): VITD: 46.93 ng/mL (ref 30.00–100.00)

## 2024-04-16 LAB — HEMOGLOBIN A1C: Hgb A1c MFr Bld: 6 % (ref 4.6–6.5)

## 2024-04-16 MED ORDER — ACYCLOVIR 400 MG PO TABS
400.0000 mg | ORAL_TABLET | Freq: Two times a day (BID) | ORAL | 0 refills | Status: AC
Start: 2024-04-16 — End: 2024-07-15

## 2024-04-16 MED ORDER — DICLOFENAC SODIUM 1 % EX GEL: 2.0000 g | Freq: Four times a day (QID) | CUTANEOUS | 5 refills | Status: AC

## 2024-04-16 MED ORDER — ATORVASTATIN CALCIUM 80 MG PO TABS: ORAL_TABLET | ORAL | 0 refills | Status: DC

## 2024-04-16 MED ORDER — SILDENAFIL CITRATE 20 MG PO TABS: ORAL_TABLET | ORAL | 0 refills | Status: DC

## 2024-04-16 MED ORDER — HYDROCORTISONE ACETATE 1 % EX OINT: 1.0000 | TOPICAL_OINTMENT | Freq: Two times a day (BID) | CUTANEOUS | 1 refills | Status: AC | PRN

## 2024-04-16 NOTE — Patient Instructions (Signed)

## 2024-04-16 NOTE — Progress Notes (Signed)
 New Patient Office Visit  Subjective    Patient ID: SYLIS KETCHUM, male    DOB: [birth year]  Age: 68 y.o. MRN: [id number]  CC:  Chief Complaint  Patient presents with   Establish Care    HPI Tony Ware presents to establish care today. He is a previous patient of Dr. Vangie Genet. Reports compliance with medication regimen.  He is fasting today. Requesting refills of prescription medications today. Requesting prescription for diclofenac gel, Anusol  cream. Current daily smoker, not ready to quit. Denies other concerns today. Medical history as outlined below.  Outpatient Encounter Medications as of 04/16/2024  Medication Sig   acetaminophen  (TYLENOL ) 325 MG tablet Take 650 mg by mouth every 6 (six) hours as needed.   acyclovir  (ZOVIRAX ) 400 MG tablet Take 1 tablet (400 mg total) by mouth 2 (two) times daily.   Ascorbic Acid (VITAMIN C PO) Take 1 tablet by mouth daily.   aspirin EC 81 MG tablet Take 81 mg by mouth daily. Swallow whole.   Cholecalciferol (VITAMIN D  PO) Take 2,000 Units by mouth daily.    diclofenac Sodium (VOLTAREN) 1 % GEL Apply 2 g topically 4 (four) times daily.   gabapentin  (NEURONTIN ) 100 MG capsule Take 1 capsule (100 mg total) by mouth at bedtime.   Hydrocortisone  Acetate 1 % OINT Apply 1 Application topically 2 (two) times daily as needed.   OVER THE COUNTER MEDICATION Takes an OTC iron tablet daily.   [DISCONTINUED] atorvastatin  (LIPITOR) 80 MG tablet Take  1 tablet  Daily  for Cholesterol to Prevent Heart Attack, Strokes, Impotence  & Dementia   [DISCONTINUED] sildenafil  (REVATIO ) 20 MG tablet TAKE ONE TO FIVE TABLETS BY MOUTH ONCE DAILY AS NEEDED   atorvastatin  (LIPITOR) 80 MG tablet Take  1 tablet  Daily  for Cholesterol to Prevent Heart Attack, Strokes, Impotence  & Dementia   sildenafil  (REVATIO ) 20 MG tablet TAKE ONE TO FIVE TABLETS BY MOUTH ONCE DAILY AS NEEDED   No facility-administered encounter medications on file as of 04/16/2024.    Past  Medical History:  Diagnosis Date   Labile hypertension    09/25/20- pt states no HTN   Osteoarthritis    Other testicular hypofunction    Prediabetes     Past Surgical History:  Procedure Laterality Date   COLONOSCOPY  06/2010   5 yr recall   HERNIA REPAIR     THULIUM LASER TURP (TRANSURETHRAL RESECTION OF PROSTATE) N/A 01/05/2019   Procedure: THULIUM LASER TURP (TRANSURETHRAL RESECTION OF PROSTATE);  Surgeon: Christina Coyer, MD;  [facility];  Service: Urology;  Laterality: N/A;    Family History  Problem Relation Age of Onset   Hypertension Mother    Diabetes Mother    Cancer Father        colon   Colon cancer Father    Colon polyps Father    Esophageal cancer Neg Hx    Rectal cancer Neg Hx    Stomach cancer Neg Hx     Social History   Socioeconomic History   Marital status: Single    Spouse name: Not on file   Number of children: Not on file   Years of education: Not on file   Highest education level: Not on file  Occupational History   Not on file  Tobacco Use   Smoking status: Every [place]    Current packs/[place]: 0.25    Average packs/[place]: 0.3 packs/[place] for 49.8 years (12.5 ttl pk-yrs)  Types: Cigarettes    Start date: 06/25/1974   Smokeless tobacco: Never   Tobacco comments:    09/2020-5-6 cigarettes per [place]  Vaping Use   Vaping status: Never Used  Substance and Sexual Activity   Alcohol use: Yes    Alcohol/week: 2.0 standard drinks of alcohol    Types: 2 Standard drinks or equivalent per week    Comment: beers   Drug use: No   Sexual activity: Yes  Other Topics Concern   Not on file  Social History Narrative   Not on file   Social Drivers of Health   Financial Resource Strain: Not on file  Food Insecurity: Not on file  Transportation Needs: Not on file  Physical Activity: Not on file  Stress: Not on file  Social Connections: Not on file  Intimate Partner Violence: Not on file    ROS Per HPI      Objective     BP (!) 142/90 (BP Location: Left Arm, Patient Position: Sitting, Cuff Size: Normal)   Temp 98.6 F (37 C) (Temporal)   Ht 5\' 10"  (1.778 m)   Wt 170 lb 3.2 oz (77.2 kg)   SpO2 96%   BMI 24.42 kg/m   Physical Exam Vitals and nursing note reviewed.  Constitutional:      General: He is not in acute distress.    Appearance: Normal appearance.  HENT:     Head: Normocephalic and atraumatic.     Right Ear: External ear normal.     Left Ear: External ear normal.     Nose: Nose normal.     Mouth/Throat:     Mouth: Mucous membranes are moist.     Pharynx: Oropharynx is clear.  Eyes:     Extraocular Movements: Extraocular movements intact.  Neck:     Vascular: No carotid bruit.  Cardiovascular:     Rate and Rhythm: Normal rate and regular rhythm.     Pulses: Normal pulses.     Heart sounds: Normal heart sounds.  Pulmonary:     Effort: Pulmonary effort is normal. No respiratory distress.     Breath sounds: Normal breath sounds. No wheezing, rhonchi or rales.  Musculoskeletal:        General: Normal range of motion.     Cervical back: Normal range of motion.     Right lower leg: No edema.     Left lower leg: No edema.  Lymphadenopathy:     Cervical: No cervical adenopathy.  Skin:    General: Skin is warm and dry.  Neurological:     General: No focal deficit present.     Mental Status: He is alert and oriented to person, place, and time.  Psychiatric:        Mood and Affect: Mood normal.        Behavior: Behavior normal.         Assessment & Plan:   Sickle-cell trait (HCC) -     CBC with Differential/Platelet -     Comprehensive metabolic panel with GFR -     Vitamin B12  Vitamin D  deficiency -     VITAMIN D  25 Hydroxy (Vit-D Deficiency, Fractures)  Testosterone  deficiency -     Sildenafil  Citrate; TAKE ONE TO FIVE TABLETS BY MOUTH ONCE DAILY AS NEEDED  Dispense: 30 tablet; Refill: 0  Pre-diabetes -     Hemoglobin A1c -     Lipid panel  Current smoker -      CBC with Differential/Platelet -  Comprehensive metabolic panel with GFR -     VITAMIN D  25 Hydroxy (Vit-D Deficiency, Fractures) -     Vitamin B12  Hyperlipidemia, mixed -     Lipid panel -     Atorvastatin  Calcium ; Take  1 tablet  Daily  for Cholesterol to Prevent Heart Attack, Strokes, Impotence  & Dementia  Dispense: 90 tablet; Refill: 0  HSV infection -     Acyclovir ; Take 1 tablet (400 mg total) by mouth 2 (two) times daily.  Dispense: 180 tablet; Refill: 0  Other osteoarthritis involving multiple joints -     Diclofenac Sodium; Apply 2 g topically 4 (four) times daily.  Dispense: 150 g; Refill: 5  Other hemorrhoids -     Hydrocortisone  Acetate; Apply 1 Application topically 2 (two) times daily as needed.  Dispense: 28.4 g; Refill: 1  Medication management -     CBC with Differential/Platelet -     Comprehensive metabolic panel with GFR -     Hemoglobin A1c -     Lipid panel -     VITAMIN D  25 Hydroxy (Vit-D Deficiency, Fractures) -     Vitamin B12     Return in about 6 months (around 10/17/2024) for AWV, meds, labs.   Wellington Half, FNP

## 2024-07-06 ENCOUNTER — Other Ambulatory Visit: Payer: Self-pay | Admitting: Family Medicine

## 2024-07-06 DIAGNOSIS — E349 Endocrine disorder, unspecified: Secondary | ICD-10-CM

## 2024-07-13 ENCOUNTER — Other Ambulatory Visit: Payer: Self-pay | Admitting: Family Medicine

## 2024-07-13 DIAGNOSIS — E782 Mixed hyperlipidemia: Secondary | ICD-10-CM

## 2024-08-03 ENCOUNTER — Ambulatory Visit (INDEPENDENT_AMBULATORY_CARE_PROVIDER_SITE_OTHER): Admitting: Family Medicine

## 2024-08-03 ENCOUNTER — Encounter: Payer: Self-pay | Admitting: Family Medicine

## 2024-08-03 VITALS — BP 140/82 | HR 73 | Temp 98.6°F | Ht 70.0 in | Wt 165.0 lb

## 2024-08-03 DIAGNOSIS — Z8719 Personal history of other diseases of the digestive system: Secondary | ICD-10-CM

## 2024-08-03 DIAGNOSIS — R1031 Right lower quadrant pain: Secondary | ICD-10-CM

## 2024-08-03 DIAGNOSIS — T148XXA Other injury of unspecified body region, initial encounter: Secondary | ICD-10-CM | POA: Diagnosis not present

## 2024-08-03 LAB — POC URINALSYSI DIPSTICK (AUTOMATED)
Bilirubin, UA: NEGATIVE
Blood, UA: NEGATIVE
Glucose, UA: NEGATIVE
Ketones, UA: NEGATIVE
Leukocytes, UA: NEGATIVE
Nitrite, UA: NEGATIVE
Protein, UA: NEGATIVE
Spec Grav, UA: 1.01 (ref 1.010–1.025)
Urobilinogen, UA: 0.2 U/dL
pH, UA: 6 (ref 5.0–8.0)

## 2024-08-03 MED ORDER — TIZANIDINE HCL 4 MG PO TABS: 4.0000 mg | ORAL_TABLET | Freq: Three times a day (TID) | ORAL | 0 refills | Status: AC | PRN

## 2024-08-03 NOTE — Progress Notes (Signed)
 Acute Office Visit  Subjective:     Patient ID: Tony Ware, male    DOB: 08/07/1956, 68 y.o.   MRN: [id number]  Chief Complaint  Patient presents with   Acute Visit    Picked up a heavy carpet machine 2 weeks ago, has a strained testile. Tender to the touch, has taken tylenol  for pain    HPI  Discussed the use of AI scribe software for clinical note transcription with the patient, who gave verbal consent to proceed.  History of Present Illness Tony Ware is a 68 year old male who presents with right-sided abdominal tenderness.  Right abdominal tenderness - Right-sided abdominal tenderness associated with prior hernia surgery from the early 2000s - Onset after lifting a coffee machine; occurs the [place] after such activities - Tenderness is persistent but not constant, with improvement as the Oh progresses - Area is tender but not painful - Tylenol  provides some improvement  Genitourinary symptoms - No urinary changes - No penile discharge - No dysuria     ROS Per HPI      Objective:    BP (!) 140/82 (BP Location: Left Arm, Patient Position: Sitting, Cuff Size: Normal)   Pulse 73   Temp 98.6 F (37 C) (Temporal)   Ht 5' 10 (1.778 m)   Wt 165 lb (74.8 kg)   SpO2 99%   BMI 23.68 kg/m    Physical Exam Vitals and nursing note reviewed. Chaperone present: Wilford Olden, CMA.  Constitutional:      General: He is not in acute distress.    Appearance: Normal appearance.  HENT:     Head: Normocephalic and atraumatic.  Eyes:     Extraocular Movements: Extraocular movements intact.  Cardiovascular:     Rate and Rhythm: Normal rate and regular rhythm.  Pulmonary:     Effort: Pulmonary effort is normal.  Abdominal:     General: Bowel sounds are normal. There is no distension.     Palpations: Abdomen is soft.     Tenderness: There is abdominal tenderness. There is no guarding or rebound.     Hernia: A hernia is present. Hernia is present in the right inguinal area.       Comments: Area of mild tenderness, mild swelling, reducible mass to R inguinal canal. No erythema noted  Musculoskeletal:        General: Normal range of motion.     Cervical back: Normal range of motion.     Right lower leg: No edema.     Left lower leg: No edema.  Lymphadenopathy:     Cervical: No cervical adenopathy.  Skin:    General: Skin is warm and dry.  Neurological:     General: No focal deficit present.     Mental Status: He is alert and oriented to person, place, and time.  Psychiatric:        Mood and Affect: Mood normal.        Behavior: Behavior normal.     Results for orders placed or performed in visit on 08/03/24  POCT Urinalysis Dipstick (Automated)  Result Value Ref Range   Color, UA yellow    Clarity, UA clear    Glucose, UA Negative Negative   Bilirubin, UA negative    Ketones, UA negative    Spec Grav, UA 1.010 1.010 - 1.025   Blood, UA negative    pH, UA 6.0 5.0 - 8.0   Protein, UA Negative Negative   Urobilinogen, UA  0.2 0.2 or 1.0 E.U./dL   Nitrite, UA negative    Leukocytes, UA Negative Negative        Assessment & Plan:   Assessment and Plan Assessment & Plan Right Groin pain, muscle strain, history of Right Inguinal Hernia Tenderness at previous hernia repair site suggests possible recurrent hernia or muscular strain. Differential includes recurrent hernia or muscular strain. Urinary symptoms absent, reducing likelihood of UTI or prostatitis. - Order abdominal ultrasound to evaluate for recurrent hernia or muscular issue. - Advise avoiding lifting objects over ten pounds. - Consider muscle relaxer if ultrasound rules out hernia and symptoms persist. - Schedule ultrasound at [facility] on [address]; contact him for scheduling. - Perform urinalysis to check for white blood cells. - Follow up after ultrasound and urinalysis results to determine further treatment.     Orders Placed This Encounter  Procedures   US  Pelvis  Limited    Standing Status:   Future    Expiration Date:   08/03/2025    Reason for Exam (SYMPTOM  OR DIAGNOSIS REQUIRED):   right groin pain    Preferred imaging location?:   [facility]   POCT Urinalysis Dipstick (Automated)     Meds ordered this encounter  Medications   tiZANidine  (ZANAFLEX ) 4 MG tablet    Sig: Take 1 tablet (4 mg total) by mouth every 8 (eight) hours as needed for muscle spasms.    Dispense:  30 tablet    Refill:  0    Return if symptoms worsen or fail to improve.  Corean LITTIE Ku, FNP

## 2024-08-03 NOTE — Patient Instructions (Addendum)
 I have ordered an ultrasound for you today. Someone will be reaching out to get you scheduled for this. We will be in contact with results once they are received.  Urine looks great today, so I will send in a muscle relaxer for you to take twice daily as needed for muscle spasms and tenderness.   I have sent in a muscle relaxer for you to use one tablet as needed for muscle spasms. This medication can make you sleepy. Do not drive until you know how this medication affects you.  Follow-up with me for new or worsening symptoms.

## 2024-08-06 ENCOUNTER — Ambulatory Visit
Admission: RE | Admit: 2024-08-06 | Discharge: 2024-08-06 | Disposition: A | Discharge status: Home / Self Care | Source: Ambulatory Visit | Attending: Family Medicine | Admitting: Family Medicine

## 2024-08-06 DIAGNOSIS — R1031 Right lower quadrant pain: Secondary | ICD-10-CM

## 2024-08-10 ENCOUNTER — Other Ambulatory Visit: Payer: Self-pay | Admitting: Family Medicine

## 2024-08-10 ENCOUNTER — Ambulatory Visit: Payer: Self-pay | Admitting: Family Medicine

## 2024-08-10 DIAGNOSIS — R1031 Right lower quadrant pain: Secondary | ICD-10-CM

## 2024-08-10 DIAGNOSIS — K4090 Unilateral inguinal hernia, without obstruction or gangrene, not specified as recurrent: Secondary | ICD-10-CM

## 2024-08-10 DIAGNOSIS — T148XXA Other injury of unspecified body region, initial encounter: Secondary | ICD-10-CM

## 2024-08-24 ENCOUNTER — Ambulatory Visit
Admission: RE | Admit: 2024-08-24 | Discharge: 2024-08-24 | Disposition: A | Discharge status: Home / Self Care | Source: Ambulatory Visit | Attending: Family Medicine | Admitting: Family Medicine

## 2024-08-24 DIAGNOSIS — K4090 Unilateral inguinal hernia, without obstruction or gangrene, not specified as recurrent: Secondary | ICD-10-CM

## 2024-08-24 MED ORDER — IOPAMIDOL (ISOVUE-370) INJECTION 76%
75.0000 mL | Freq: Once | INTRAVENOUS | Status: AC | PRN
  Administered 2024-08-24: 75 mL via INTRAVENOUS

## 2024-08-27 ENCOUNTER — Ambulatory Visit: Payer: Self-pay | Admitting: Family Medicine

## 2024-09-02 ENCOUNTER — Encounter: Payer: Self-pay | Admitting: Family Medicine

## 2024-09-02 ENCOUNTER — Ambulatory Visit: Admitting: Family Medicine

## 2024-09-02 VITALS — BP 140/70 | HR 74 | Temp 97.9°F | Ht 70.0 in | Wt 164.8 lb

## 2024-09-02 DIAGNOSIS — L03317 Cellulitis of buttock: Secondary | ICD-10-CM | POA: Diagnosis not present

## 2024-09-02 DIAGNOSIS — K649 Unspecified hemorrhoids: Secondary | ICD-10-CM | POA: Diagnosis not present

## 2024-09-02 MED ORDER — HYDROCORTISONE (PERIANAL) 2.5 % EX CREA: 1.0000 | TOPICAL_CREAM | Freq: Two times a day (BID) | CUTANEOUS | 0 refills | Status: AC

## 2024-09-02 MED ORDER — CEPHALEXIN 500 MG PO CAPS: 500.0000 mg | ORAL_CAPSULE | Freq: Two times a day (BID) | ORAL | 0 refills | Status: AC

## 2024-09-02 NOTE — Progress Notes (Signed)
 Acute Office Visit  Subjective:     Patient ID: Tony Ware, male    DOB: [birth year], 68 y.o.   MRN: [id number]  Chief Complaint  Patient presents with   Acute Visit    Hemorrhoids x 2weeks    HPI  Discussed the use of AI scribe software for clinical note transcription with the patient, who gave verbal consent to proceed.  History of Present Illness Tony Ware is a 68 year old male who presents for a medication refill and antibiotic prescription.  Perianal discomfort - Requests refill of hemorrhoid cream, specifically 2.5% formulation - Over-the-counter options such as Preparation H have been ineffective - Discomfort severe enough to prevent sleep last night - No discharge from the affected areas - Recent horseback riding during vacation in [place] may have exacerbated symptoms - Recently evaluated by a surgeon who determined no surgery is needed at this time  Musculoskeletal pain - Retired [employer] with chronic knee and back issues  Immunization status - Received influenza vaccine yesterday - Considering this year's COVID-19 vaccine     ROS Per HPI      Objective:    BP (!) 140/70   Pulse 74   Temp 97.9 F (36.6 C) (Oral)   Ht 5' 10 (1.778 m)   Wt 164 lb 12.8 oz (74.8 kg)   SpO2 97%   BMI 23.65 kg/m    Physical Exam Vitals and nursing note reviewed.  Constitutional:      General: He is not in acute distress.    Appearance: Normal appearance.  HENT:     Head: Normocephalic and atraumatic.     Right Ear: External ear normal.     Left Ear: External ear normal.     Nose: Nose normal.     Mouth/Throat:     Mouth: Mucous membranes are moist.     Pharynx: Oropharynx is clear.  Eyes:     Extraocular Movements: Extraocular movements intact.  Cardiovascular:     Rate and Rhythm: Normal rate and regular rhythm.     Pulses: Normal pulses.     Heart sounds: Normal heart sounds.  Pulmonary:     Effort: Pulmonary effort is normal. No respiratory  distress.     Breath sounds: Normal breath sounds. No wheezing, rhonchi or rales.  Genitourinary:    Comments: Exam declined  Musculoskeletal:        General: Normal range of motion.     Cervical back: Normal range of motion.     Right lower leg: No edema.     Left lower leg: No edema.  Lymphadenopathy:     Cervical: No cervical adenopathy.  Skin:    General: Skin is warm and dry.  Neurological:     General: No focal deficit present.     Mental Status: He is alert and oriented to person, place, and time.  Psychiatric:        Mood and Affect: Mood normal.        Behavior: Behavior normal.     No results found for any visits on 09/02/24.      Assessment & Plan:   Assessment and Plan Assessment & Plan Hemorrhoids, cellulitis of buttock Chronic hemorrhoids exacerbated post horseback riding. Previous topical treatments ineffective. - Prescribe 2.5% hydrocortisone  cream. - Prescribe Keflex 500 mg BID for 5 days.  General Health Maintenance Received influenza vaccination on September 01, 2024. Undecided about updated COVID-19 vaccine. - Document influenza vaccination in medical record. - Discuss  updated COVID-19 vaccine options and encourage decision by next week.     No orders of the defined types were placed in this encounter.    Meds ordered this encounter  Medications   hydrocortisone  (ANUSOL -HC) 2.5 % rectal cream    Sig: Place 1 Application rectally 2 (two) times daily.    Dispense:  30 g    Refill:  0   cephALEXin (KEFLEX) 500 MG capsule    Sig: Take 1 capsule (500 mg total) by mouth 2 (two) times daily for 5 days.    Dispense:  10 capsule    Refill:  0    Return for as scheduled.  Corean LITTIE Ku, FNP

## 2024-09-02 NOTE — Patient Instructions (Signed)
 I have sent in hydrocortisone  2.5% cream for you to use rectally to help with the pain from your hemorrhoids.  Have also sent in Keflex for you to take 1 tablet twice a [place] for 5 days for the infection in the area.  Follow-up with me for new or worsening symptoms.

## 2024-10-18 ENCOUNTER — Ambulatory Visit: Admitting: Family Medicine

## 2024-10-18 ENCOUNTER — Encounter: Payer: Self-pay | Admitting: Family Medicine

## 2024-10-18 ENCOUNTER — Ambulatory Visit: Payer: Self-pay | Admitting: Family Medicine

## 2024-10-18 VITALS — BP 126/70 | HR 76 | Temp 98.7°F | Ht 70.0 in | Wt 167.4 lb

## 2024-10-18 DIAGNOSIS — E349 Endocrine disorder, unspecified: Secondary | ICD-10-CM

## 2024-10-18 DIAGNOSIS — R200 Anesthesia of skin: Secondary | ICD-10-CM

## 2024-10-18 DIAGNOSIS — R7303 Prediabetes: Secondary | ICD-10-CM

## 2024-10-18 DIAGNOSIS — E782 Mixed hyperlipidemia: Secondary | ICD-10-CM

## 2024-10-18 DIAGNOSIS — I10 Essential (primary) hypertension: Secondary | ICD-10-CM | POA: Diagnosis not present

## 2024-10-18 DIAGNOSIS — D638 Anemia in other chronic diseases classified elsewhere: Secondary | ICD-10-CM | POA: Diagnosis not present

## 2024-10-18 DIAGNOSIS — E559 Vitamin D deficiency, unspecified: Secondary | ICD-10-CM

## 2024-10-18 DIAGNOSIS — F17200 Nicotine dependence, unspecified, uncomplicated: Secondary | ICD-10-CM

## 2024-10-18 DIAGNOSIS — R202 Paresthesia of skin: Secondary | ICD-10-CM

## 2024-10-18 LAB — COMPREHENSIVE METABOLIC PANEL WITH GFR
ALT: 23 U/L (ref 0–53)
AST: 25 U/L (ref 0–37)
Albumin: 4.1 g/dL (ref 3.5–5.2)
Alkaline Phosphatase: 45 U/L (ref 39–117)
BUN: 9 mg/dL (ref 6–23)
CO2: 28 meq/L (ref 19–32)
Calcium: 9.7 mg/dL (ref 8.4–10.5)
Chloride: 103 meq/L (ref 96–112)
Creatinine, Ser: 1.11 mg/dL (ref 0.40–1.50)
GFR: 68.37 mL/min (ref >60.00)
Glucose, Bld: 86 mg/dL (ref 70–99)
Potassium: 4 meq/L (ref 3.5–5.1)
Sodium: 139 meq/L (ref 135–145)
Total Bilirubin: 0.6 mg/dL (ref 0.2–1.2)
Total Protein: 7 g/dL (ref 6.0–8.3)

## 2024-10-18 LAB — CBC WITH DIFFERENTIAL/PLATELET
Basophils Absolute: 0.1 K/uL (ref 0.0–0.1)
Basophils Relative: 1.9 % (ref 0.0–3.0)
Eosinophils Absolute: 0.3 K/uL (ref 0.0–0.7)
Eosinophils Relative: 6.3 % — ABNORMAL HIGH (ref 0.0–5.0)
HCT: 37.1 % — ABNORMAL LOW (ref 39.0–52.0)
Hemoglobin: 12.7 g/dL — ABNORMAL LOW (ref 13.0–17.0)
Lymphocytes Relative: 46.9 % — ABNORMAL HIGH (ref 12.0–46.0)
Lymphs Abs: 2.1 K/uL (ref 0.7–4.0)
MCHC: 34.3 g/dL (ref 30.0–36.0)
MCV: 94.9 fl (ref 78.0–100.0)
Monocytes Absolute: 0.4 K/uL (ref 0.1–1.0)
Monocytes Relative: 9 % (ref 3.0–12.0)
Neutro Abs: 1.6 K/uL (ref 1.4–7.7)
Neutrophils Relative %: 35.9 % — ABNORMAL LOW (ref 43.0–77.0)
Platelets: 226 K/uL (ref 150.0–400.0)
RBC: 3.91 Mil/uL — ABNORMAL LOW (ref 4.22–5.81)
RDW: 14 % (ref 11.5–15.5)
WBC: 4.6 K/uL (ref 4.0–10.5)

## 2024-10-18 LAB — VITAMIN B12: Vitamin B-12: 376 pg/mL (ref 211–911)

## 2024-10-18 LAB — LIPID PANEL
Cholesterol: 171 mg/dL (ref 0–200)
HDL: 53.4 mg/dL (ref >39.00)
LDL Cholesterol: 89 mg/dL (ref 0–99)
NonHDL: 117.46
Total CHOL/HDL Ratio: 3
Triglycerides: 142 mg/dL (ref 0.0–149.0)
VLDL: 28.4 mg/dL (ref 0.0–40.0)

## 2024-10-18 LAB — MICROALBUMIN / CREATININE URINE RATIO
Creatinine,U: 22.4 mg/dL
Microalb Creat Ratio: UNDETERMINED mg/g (ref 0.0–30.0)
Microalb, Ur: <0.7 mg/dL

## 2024-10-18 LAB — VITAMIN D 25 HYDROXY (VIT D DEFICIENCY, FRACTURES): VITD: 41.24 ng/mL (ref 30.00–100.00)

## 2024-10-18 LAB — HEMOGLOBIN A1C: Hgb A1c MFr Bld: 5.9 % (ref 4.6–6.5)

## 2024-10-18 MED ORDER — SILDENAFIL CITRATE 20 MG PO TABS: ORAL_TABLET | ORAL | 5 refills | Status: AC

## 2024-10-18 NOTE — Progress Notes (Signed)
 Established Patient Office Visit  Subjective:     Patient ID: Tony Ware, male    DOB: [birth year], 68 y.o.   MRN: [id number]  Chief Complaint  Patient presents with   Follow-up    9m f/u Tingling in fingers on left side for past 2-3 wks  Requesting sugar be checked    HPI  Discussed the use of AI scribe software for clinical note transcription with the patient, who gave verbal consent to proceed.  History of Present Illness Tony Ware is a 68 year old male who presents with numbness and tingling in the fingers.  Routine follow-up visit - [facility] - Will let us  know when he needs other medications  Paresthesia of the fingers - Intermittent numbness and tingling in the fingers for the past two weeks - No associated arm or neck pain - No swelling present  Diabetes mellitus risk concern - Concerned about diabetes due to family history - No prior diagnosis of diabetes     ROS Per HPI      Objective:    BP 126/70 (BP Location: Right Arm, Patient Position: Sitting)   Pulse 76   Temp 98.7 F (37.1 C) (Temporal)   Ht 5' 10 (1.778 m)   Wt 167 lb 6.4 oz (75.9 kg)   SpO2 99%   BMI 24.02 kg/m    Physical Exam Vitals and nursing note reviewed.  Constitutional:      General: He is not in acute distress.    Appearance: Normal appearance.  HENT:     Head: Normocephalic and atraumatic.     Right Ear: External ear normal.     Left Ear: External ear normal.     Nose: Nose normal.     Mouth/Throat:     Mouth: Mucous membranes are moist.     Pharynx: Oropharynx is clear.  Eyes:     Extraocular Movements: Extraocular movements intact.  Neck:     Vascular: No carotid bruit.  Cardiovascular:     Rate and Rhythm: Normal rate and regular rhythm.     Pulses: Normal pulses.     Heart sounds: Normal heart sounds.  Pulmonary:     Effort: Pulmonary effort is normal. No respiratory distress.     Breath sounds: Normal  breath sounds. No wheezing, rhonchi or rales.  Musculoskeletal:        General: Normal range of motion.     Cervical back: Normal range of motion.     Right lower leg: No edema.     Left lower leg: No edema.  Lymphadenopathy:     Cervical: No cervical adenopathy.  Skin:    General: Skin is warm and dry.  Neurological:     General: No focal deficit present.     Mental Status: He is alert and oriented to person, place, and time.  Psychiatric:        Mood and Affect: Mood normal.        Behavior: Behavior normal.     No results found for any visits on 10/18/24.  The 10-year ASCVD risk score (Arnett DK, et al., 2019) is: 15.7%  BP Readings from Last 3 Encounters:  10/18/24 126/70  09/02/24 (!) 140/70  08/03/24 (!) 140/82   Wt Readings from Last 3 Encounters:  10/18/24 167 lb 6.4 oz (75.9 kg)  09/02/24 164 lb 12.8 oz (74.8 kg)  08/03/24 165 lb (74.8 kg)      Last  CBC Lab Results  Component Value Date   WBC 4.4 04/16/2024   HGB 12.9 (L) 04/16/2024   HCT 38.5 (L) 04/16/2024   MCV 95.4 04/16/2024   MCH 30.8 08/12/2023   RDW 13.7 04/16/2024   PLT 305.0 04/16/2024   Last metabolic panel Lab Results  Component Value Date   GLUCOSE 96 04/16/2024   NA 139 04/16/2024   K 4.7 04/16/2024   CL 102 04/16/2024   CO2 31 04/16/2024   BUN 10 04/16/2024   CREATININE 1.18 04/16/2024   GFR 63.76 04/16/2024   CALCIUM  9.5 04/16/2024   PROT 7.5 04/16/2024   ALBUMIN 4.3 04/16/2024   BILITOT 0.3 04/16/2024   ALKPHOS 53 04/16/2024   AST 24 04/16/2024   ALT 19 04/16/2024   Last lipids Lab Results  Component Value Date   CHOL 156 04/16/2024   HDL 61.20 04/16/2024   LDLCALC 73 04/16/2024   TRIG 109.0 04/16/2024   CHOLHDL 3 04/16/2024   Last hemoglobin A1c Lab Results  Component Value Date   HGBA1C 6.0 04/16/2024   Last thyroid  functions Lab Results  Component Value Date   TSH 1.13 08/12/2023   Last vitamin D  Lab Results  Component Value Date   VD25OH 46.93  04/16/2024   Last vitamin B12 and Folate Lab Results  Component Value Date   VITAMINB12 484 04/16/2024         Assessment & Plan:   Assessment and Plan Assessment & Plan Essential hypertension -Controlled, CBC, CMP, microalbumin today - Diet controlled, continue low-salt diet  Vitamin D  deficiency -Vitamin D  levels today in the lab  Mixed hyperlipidemia -Controlled with atorvastatin , continue - Recheck lipids today  Current smoker -Not interested in quitting  Anemia of chronic disease -CBC, vitamin B12 levels today  Testosterone  deficiency -Refilled sildenafil  today  Numbness and tingling in left fingers Intermittent numbness and tingling in fingers without arm or neck pain. - Ordered blood sugar and A1c tests to evaluate for diabetes. - States is not bothering him very much but he wanted to let us  know that it was there - May take gabapentin  as needed  Prediabetes Family history of diabetes. Symptoms warrant evaluation. - Ordered blood sugar and A1c tests to evaluate for diabetes.  Medication management -Labs today, will dose adjust medications as needed     Orders Placed This Encounter  Procedures   CBC with Differential/Platelet    Release to patient:   Immediate [1]   Comprehensive metabolic panel with GFR    Release to patient:   Immediate [1]   Hemoglobin A1c   Lipid panel   Microalbumin / creatinine urine ratio    Release to patient:   Immediate   VITAMIN D  25 Hydroxy (Vit-D Deficiency, Fractures)   Vitamin B12     Meds ordered this encounter  Medications   sildenafil  (REVATIO ) 20 MG tablet    Sig: TAKE 1 TO 5 TABLETS BY MOUTH DAILY AS NEEDED    Dispense:  30 tablet    Refill:  5    Return in about 6 months (around 04/18/2025) for meds OV.  Corean LITTIE Ku, FNP

## 2024-10-18 NOTE — Patient Instructions (Addendum)
 Continue current medication regimen.  Have sent in refills of sildenafil  to [facility] for you today.  We are checking labs today, will be in contact with any results that require further attention  If the numbness and tingling does not get any better over the next couple of weeks, follow-up and let me know.  Otherwise, follow-up with me in about 6 months for office visit, sooner if needed.

## 2024-10-18 NOTE — Telephone Encounter (Signed)
 Copied from CRM #[id number]. Topic: Clinical - Lab/Test Results >> Oct 18, 2024  1:59 PM Corin V wrote: Reason for CRM: Patient called to get lab results. Read provider note verbatim. Patient verbalized understanding and has no additional questions or concerns at this time.

## 2025-04-18 ENCOUNTER — Ambulatory Visit: Admitting: Family Medicine
